# Patient Record
Sex: Female | Born: 1945 | Race: White | Hispanic: No | Smoking: Former smoker
Health system: Southern US, Community
[De-identification: ages and names within clinical notes are randomized; demographics above are authoritative.]

## PROBLEM LIST (undated history)

## (undated) DIAGNOSIS — R079 Chest pain, unspecified: Secondary | ICD-10-CM

## (undated) DIAGNOSIS — G2581 Restless legs syndrome: Secondary | ICD-10-CM

## (undated) DIAGNOSIS — Z5189 Encounter for other specified aftercare: Secondary | ICD-10-CM

## (undated) DIAGNOSIS — Q211 Atrial septal defect, unspecified: Secondary | ICD-10-CM

## (undated) DIAGNOSIS — R55 Syncope and collapse: Secondary | ICD-10-CM

## (undated) DIAGNOSIS — F32A Depression, unspecified: Secondary | ICD-10-CM

## (undated) DIAGNOSIS — M1990 Unspecified osteoarthritis, unspecified site: Secondary | ICD-10-CM

## (undated) DIAGNOSIS — S83207A Unspecified tear of unspecified meniscus, current injury, left knee, initial encounter: Secondary | ICD-10-CM

## (undated) DIAGNOSIS — G629 Polyneuropathy, unspecified: Secondary | ICD-10-CM

## (undated) DIAGNOSIS — R739 Hyperglycemia, unspecified: Secondary | ICD-10-CM

## (undated) DIAGNOSIS — F329 Major depressive disorder, single episode, unspecified: Secondary | ICD-10-CM

## (undated) DIAGNOSIS — K219 Gastro-esophageal reflux disease without esophagitis: Secondary | ICD-10-CM

## (undated) DIAGNOSIS — I5032 Chronic diastolic (congestive) heart failure: Secondary | ICD-10-CM

## (undated) DIAGNOSIS — I10 Essential (primary) hypertension: Secondary | ICD-10-CM

## (undated) DIAGNOSIS — H3530 Unspecified macular degeneration: Secondary | ICD-10-CM

## (undated) HISTORY — DX: Atrial septal defect, unspecified: Q21.10

## (undated) HISTORY — PX: MULTIPLE TOOTH EXTRACTIONS: SHX2053

## (undated) HISTORY — PX: CATARACT EXTRACTION: SUR2

## (undated) HISTORY — DX: Polyneuropathy, unspecified: G62.9

## (undated) HISTORY — DX: Major depressive disorder, single episode, unspecified: F32.9

## (undated) HISTORY — DX: Gastro-esophageal reflux disease without esophagitis: K21.9

## (undated) HISTORY — DX: Unspecified macular degeneration: H35.30

## (undated) HISTORY — PX: TONSILLECTOMY: SUR1361

## (undated) HISTORY — DX: Essential (primary) hypertension: I10

## (undated) HISTORY — DX: Depression, unspecified: F32.A

## (undated) HISTORY — DX: Chest pain, unspecified: R07.9

## (undated) HISTORY — DX: Restless legs syndrome: G25.81

## (undated) HISTORY — DX: Syncope and collapse: R55

## (undated) HISTORY — DX: Hyperglycemia, unspecified: R73.9

## (undated) HISTORY — DX: Unspecified osteoarthritis, unspecified site: M19.90

## (undated) HISTORY — DX: Chronic diastolic (congestive) heart failure: I50.32

---

## 1974-08-27 HISTORY — PX: TUBAL LIGATION: SHX77

## 1974-08-27 HISTORY — PX: APPENDECTOMY: SHX54

## 2004-08-27 HISTORY — PX: CARDIAC CATHETERIZATION: SHX172

## 2005-02-21 ENCOUNTER — Inpatient Hospital Stay (HOSPITAL_COMMUNITY): Admission: AD | Admit: 2005-02-21 | Discharge: 2005-02-27 | Payer: Self-pay | Admitting: Orthopedic Surgery

## 2005-02-23 ENCOUNTER — Ambulatory Visit: Payer: Self-pay | Admitting: Cardiology

## 2005-06-27 HISTORY — PX: JOINT REPLACEMENT: SHX530

## 2005-07-11 ENCOUNTER — Inpatient Hospital Stay (HOSPITAL_COMMUNITY): Admission: RE | Admit: 2005-07-11 | Discharge: 2005-07-16 | Payer: Self-pay | Admitting: Orthopedic Surgery

## 2005-07-25 ENCOUNTER — Encounter: Payer: Self-pay | Admitting: Orthopedic Surgery

## 2005-07-27 ENCOUNTER — Encounter: Payer: Self-pay | Admitting: Orthopedic Surgery

## 2005-08-27 ENCOUNTER — Encounter: Payer: Self-pay | Admitting: Orthopedic Surgery

## 2005-09-27 ENCOUNTER — Encounter: Payer: Self-pay | Admitting: Orthopedic Surgery

## 2005-10-25 ENCOUNTER — Encounter: Payer: Self-pay | Admitting: Orthopedic Surgery

## 2006-08-27 HISTORY — PX: OTHER SURGICAL HISTORY: SHX169

## 2009-04-06 ENCOUNTER — Ambulatory Visit (HOSPITAL_COMMUNITY): Admission: RE | Admit: 2009-04-06 | Discharge: 2009-04-06 | Payer: Self-pay | Admitting: Endocrinology

## 2010-04-17 LAB — LIPID PANEL
HDL: 58 mg/dL (ref 35–70)
LDL Cholesterol: 56 mg/dL
Triglycerides: 184 mg/dL — AB (ref 40–160)

## 2010-04-17 LAB — HEMOGLOBIN A1C: Hgb A1c MFr Bld: 6 % (ref 4.0–6.0)

## 2010-09-21 LAB — HEPATIC FUNCTION PANEL: Bilirubin, Total: 0.3 mg/dL

## 2010-11-23 ENCOUNTER — Encounter: Payer: Self-pay | Admitting: Family Medicine

## 2010-12-04 ENCOUNTER — Ambulatory Visit: Payer: Self-pay | Admitting: Family Medicine

## 2010-12-06 ENCOUNTER — Encounter: Payer: Self-pay | Admitting: Family Medicine

## 2010-12-07 ENCOUNTER — Encounter: Payer: Self-pay | Admitting: Family Medicine

## 2010-12-07 ENCOUNTER — Ambulatory Visit (INDEPENDENT_AMBULATORY_CARE_PROVIDER_SITE_OTHER): Payer: Managed Care, Other (non HMO) | Admitting: Family Medicine

## 2010-12-07 VITALS — BP 142/70 | HR 60 | Temp 98.4°F | Ht 63.5 in | Wt 255.4 lb

## 2010-12-07 DIAGNOSIS — G609 Hereditary and idiopathic neuropathy, unspecified: Secondary | ICD-10-CM

## 2010-12-07 DIAGNOSIS — Z1211 Encounter for screening for malignant neoplasm of colon: Secondary | ICD-10-CM

## 2010-12-07 DIAGNOSIS — F32A Depression, unspecified: Secondary | ICD-10-CM

## 2010-12-07 DIAGNOSIS — I10 Essential (primary) hypertension: Secondary | ICD-10-CM | POA: Insufficient documentation

## 2010-12-07 DIAGNOSIS — R739 Hyperglycemia, unspecified: Secondary | ICD-10-CM

## 2010-12-07 DIAGNOSIS — G629 Polyneuropathy, unspecified: Secondary | ICD-10-CM

## 2010-12-07 DIAGNOSIS — K219 Gastro-esophageal reflux disease without esophagitis: Secondary | ICD-10-CM | POA: Insufficient documentation

## 2010-12-07 DIAGNOSIS — G2581 Restless legs syndrome: Secondary | ICD-10-CM | POA: Insufficient documentation

## 2010-12-07 DIAGNOSIS — F329 Major depressive disorder, single episode, unspecified: Secondary | ICD-10-CM

## 2010-12-07 DIAGNOSIS — M1990 Unspecified osteoarthritis, unspecified site: Secondary | ICD-10-CM

## 2010-12-07 MED ORDER — TRIAMTERENE-HCTZ 37.5-25 MG PO TABS: 1.0000 | ORAL_TABLET | Freq: Every day | ORAL | Status: DC

## 2010-12-07 MED ORDER — GABAPENTIN 300 MG PO CAPS: 300.0000 mg | ORAL_CAPSULE | Freq: Every evening | ORAL | Status: DC | PRN

## 2010-12-07 NOTE — Assessment & Plan Note (Signed)
Deteriorated. Awaiting records so I can review labs.   Start low dose Gabapentin- 300 mg qhs. Pt to call me with an update of her symptoms in 2-3 weeks.

## 2010-12-07 NOTE — Patient Instructions (Signed)
Great to meet you. Please make an appointment to come see me in July (medicare physical).    What is this medicine? GABAPENTIN (GA ba pen tin) is used to control partial seizures in adults with epilepsy. It is also used to treat certain types of nerve pain. This medicine may be used for other purposes; ask your health care provider or pharmacist if you have questions.   What should I tell my health care provider before I take this medicine? They need to know if you have any of these conditions: -kidney disease -suicidal thoughts, plans, or attempt; a previous suicide attempt by you or a family member -an unusual or allergic reaction to gabapentin, other medicines, foods, dyes, or preservatives -pregnant or trying to get pregnant -breast-feeding   How should I use this medicine? Take this medicine by mouth. Swallow it with a drink of water. Follow the directions on the prescription label. If this medicine upsets your stomach, take it with food or milk. Take your medicine at regular intervals. Do not take it more often than directed. If you are directed to break the 600 or 800 mg tablets in half as part of your dose, the extra half tablet should be used for the next dose. If you have not used the extra half tablet within 3 days, it should be thrown away. A special MedGuide will be given to you by the pharmacist with each prescription and refill. Be sure to read this information carefully each time. Talk to your pediatrician regarding the use of this medicine in children. Special care may be needed. Overdosage: If you think you have taken too much of this medicine contact a poison control center or emergency room at once. NOTE: This medicine is only for you. Do not share this medicine with others.   What if I miss a dose? If you miss a dose, take it as soon as you can. If it is almost time for your next dose, take only that dose. Do not take double or extra doses.   What may interact with  this medicine? -antacids -hydrocodone -morphine -naproxen -sevelamer   This list may not describe all possible interactions. Give your health care provider a list of all the medicines, herbs, non-prescription drugs, or dietary supplements you use. Also tell them if you smoke, drink alcohol, or use illegal drugs. Some items may interact with your medicine.   What should I watch for while using this medicine? Visit your doctor or health care professional for regular checks on your progress. You may want to keep a record at home of how you feel your condition is responding to treatment. You may want to share this information with your doctor or health care professional at each visit. You should contact your doctor or health care professional if your seizures get worse or if you have any new types of seizures. Do not stop taking this medicine or any of your seizure medicines unless instructed by your doctor or health care professional. Stopping your medicine suddenly can increase your seizures or their severity. Wear a medical identification bracelet or chain if you are taking this medicine for seizures, and carry a card that lists all your medications. You may get drowsy, dizzy, or have blurred vision. Do not drive, use machinery, or do anything that needs mental alertness until you know how this medicine affects you. To reduce dizzy or fainting spells, do not sit or stand up quickly, especially if you are an older patient. Alcohol  can increase drowsiness and dizziness. Avoid alcoholic drinks. Your mouth may get dry. Chewing sugarless gum or sucking hard candy, and drinking plenty of water will help. The use of this medicine may increase the chance of suicidal thoughts or actions. Pay special attention to how you are responding while on this medicine. Any worsening of mood, or thoughts of suicide or dying should be reported to your health care professional right away. Women who become pregnant while  using this medicine may enroll in the [place] Antiepileptic Drug Pregnancy Registry by calling [phone]. This registry collects information about the safety of antiepileptic drug use during pregnancy.   What side effects may I notice from receiving this medicine? Side effects that you should report to your doctor or health care professional as soon as possible: -allergic reactions like skin rash, itching or hives, swelling of the face, lips, or tongue -worsening of mood, thoughts or actions of suicide or dying   Side effects that usually do not require medical attention (report to your doctor or health care professional if they continue or are bothersome): -constipation -difficulty walking or controlling muscle movements -nausea -slurred speech -tremors -weight gain   This list may not describe all possible side effects. Call your doctor for medical advice about side effects. You may report side effects to FDA at 1-800-FDA-1088.   Where should I keep my medicine? Keep out of reach of children.   Store at room temperature between 15 and 30 degrees C (59 and 86 degrees F). Throw away any unused medicine after the expiration date.   NOTE:This sheet is a summary. It may not cover all possible information. If you have questions about this medicine, talk to your doctor, pharmacist, or health care provider.      2011, Elsevier/Gold Standard.

## 2010-12-07 NOTE — Assessment & Plan Note (Signed)
Continue current meds. Slightly elevated today but pt endorses white coat HTN.

## 2010-12-07 NOTE — Assessment & Plan Note (Signed)
Deteriorated. Discussed importance of physical activity. She admits to becoming more sedentary lately.

## 2010-12-07 NOTE — Progress Notes (Signed)
65 yo here to establish care.  1.  Peripheral neuropathy- borderline diabetic but she has had neuropathy in her feet and toes for years.  Per pt, due to her multiple other co morbidities. Feet have been really tingling lately.  Had recent lab work (awaiting labs).  Never been on any medication for neuropathy, like gabapentin or lyrica.  2.  HTN- takes Maxzide 1 tab daily, Toprol 25 mg daily, and Ramipril 5 mg daily. Denies any blurred vision, CP, or SOB. Has some LE edema, no worse than usual.  3.  Anxiety- admits to being an anxious person. Was on Lexapro and Xanax for years but weaned herself off because she felt it blunted her emotions too much. Overall, feels like she is doing well.  Denies any current symptoms of depression.  ROS: See HPI General: Denies fever, chills, sweats. No significant weight loss. Eyes: Denies blurring,significant itching ENT: Denies earache, sore throat, and hoarseness.  Cardiovascular: Denies chest pains, palpitations, dyspnea on exertion,  Respiratory: Denies cough, dyspnea at rest,wheeezing Breast: no concerns about lumps GI: Denies nausea, vomiting, diarrhea, constipation, change in bowel habits, abdominal pain, melena, hematochezia GU: Denies dysuria, hematuria, urinary hesitancy, nocturia, denies STD risk, no concerns about discharge Musculoskeletal: Endorses joint and back pain. Derm: Denies rash, itching Neuro: Denies  paresthesias, frequent falls, frequent headaches Psych: Denies depression, anxiety Endocrine: Denies cold intolerance, heat intolerance, polydipsia Heme: Denies enlarged lymph nodes  The PMH, PSH, Social History, Family History, Medications, and allergies have been reviewed in [facility], and have been updated if relevant.  Physical exam: BP 142/70  Pulse 60  Temp(Src) 98.4 F (36.9 C) (Oral)  Ht 5' 3.5" (1.613 m)  Wt 255 lb 6.4 oz (115.849 kg)  BMI 44.53 kg/m2  General:  Pleasant overweight female,in no acute distress;  alert,appropriate and cooperative throughout examination Head:  normocephalic and atraumatic.   Eyes:  vision grossly intact, pupils equal, pupils round, and pupils reactive to light.   Ears:  R ear normal and L ear normal.   Nose:  no external deformity.   Mouth:  good dentition.   Neck:  No deformities, masses, or tenderness noted. Lungs:  Normal respiratory effort, chest expands symmetrically. Lungs are clear to auscultation, no crackles or wheezes. Heart:  Normal rate and regular rhythm. S1 and S2 normal without gallop, murmur, click, rub or other extra sounds. Abdomen:  Bowel sounds positive,abdomen soft and non-tender without masses, organomegaly or hernias noted. Msk:  No deformity or scoliosis noted of thoracic or lumbar spine.   Extremities:  No clubbing, cyanosis, edema, or deformity noted with normal full range of motion of all joints.   Neurologic:  alert & oriented X3 and gait normal.   Skin:  Intact without suspicious lesions or rashes Psych:  Cognition and judgment appear intact. Alert and cooperative with normal attention span and concentration. No apparent delusions, illusions, hallucinations

## 2010-12-13 ENCOUNTER — Other Ambulatory Visit: Payer: Managed Care, Other (non HMO)

## 2010-12-13 ENCOUNTER — Encounter: Payer: Self-pay | Admitting: *Deleted

## 2010-12-13 ENCOUNTER — Other Ambulatory Visit (INDEPENDENT_AMBULATORY_CARE_PROVIDER_SITE_OTHER): Payer: Managed Care, Other (non HMO) | Admitting: Family Medicine

## 2010-12-13 DIAGNOSIS — Z1211 Encounter for screening for malignant neoplasm of colon: Secondary | ICD-10-CM

## 2010-12-13 LAB — FECAL OCCULT BLOOD, IMMUNOCHEMICAL: Fecal Occult Bld: NEGATIVE

## 2010-12-22 ENCOUNTER — Encounter: Payer: Self-pay | Admitting: Family Medicine

## 2010-12-25 ENCOUNTER — Encounter: Payer: Self-pay | Admitting: Family Medicine

## 2010-12-28 NOTE — Progress Notes (Signed)
Addended byMills Koller on: 12/28/2010 11:12 AM   Modules accepted: Orders

## 2010-12-29 ENCOUNTER — Other Ambulatory Visit: Payer: Self-pay | Admitting: *Deleted

## 2010-12-29 MED ORDER — TRIAMTERENE-HCTZ 37.5-25 MG PO TABS: 1.0000 | ORAL_TABLET | Freq: Every day | ORAL | Status: DC

## 2011-01-12 NOTE — Discharge Summary (Signed)
NAMEJUANETTE, [name]               ACCOUNT NO.:  [id number]   MEDICAL RECORD NO.:  [id number]          PATIENT TYPE:  INP   LOCATION:  1507                         FACILITY:  [facility]   PHYSICIAN:  Georges Lynch. Gioffre, M.D.DATE OF BIRTH:  1946/03/16   DATE OF ADMISSION:  07/11/2005  DATE OF DISCHARGE:  07/16/2005                                 DISCHARGE SUMMARY   ADMISSION DIAGNOSES:  1.  End-stage osteoarthritis right knee.  2.  Recent diagnosis of asthma.  3.  History of angina with normal recent cardiac stress test and      catheterization.  4.  Hypertension.  5.  Reflux disease.  6.  History of anxiety, depression.  7.  History of migraines.  8.  Frequent diarrhea with blood in her stools currently being monitored and      evaluated by Dr. Dagoberto Ligas.   DISCHARGE DIAGNOSES:  1.  Right total knee arthroplasty.  2.  Acute renal insufficiency, resolved.  3.  Asymptomatic postoperative blood loss anemia.  4.  History of migraines.  5.  History of anxiety/depression.  6.  Recent diagnosis of asthma.  7.  History of angina with normal cardiac stress test and catheterization.  8.  History of hypertension.  9.  History of reflux.  10. History of frequent diarrhea with blood in her stools being monitored by      Dr. Dagoberto Ligas.  11. Obesity.   HISTORY OF PRESENT ILLNESS:  The patient is a 65 year old female with  longstanding history of right knee pains. Multiple evaluations and  treatments have failed. The patient was diagnosed with end-stage  osteoarthritis of the right knee. The patient and Dr. Darrelyn Hillock have elected  to do a right total knee arthroplasty.   ALLERGIES:  No known drug allergies.   CURRENT MEDICATIONS:  1.  Lexapro 10 mg a day.  2.  Triamterene/hydrochlorothiazide 25 a day.  3.  Altace 5 mg a day.  4.  Nexium 40 mg a day.  5.  Toprol-XL 50 mg a day.  6.  Celebrex 200 mg a day.  7.  Albuterol 2 puffs p.r.n.  8.  Requip p.r.n.  9.  Carafate 1 g four times a day  p.r.n.   SURGICAL PROCEDURE:  On July 11, 2005 the patient was taken to the OR by  Ranee Gosselin, M.D. assisted by Arlyn Leak, P.A.C. Under general  anesthesia the patient  underwent a right total knee arthroplasty with a  DePuy rotating bearing system. The patient had the following components  implanted: A size 2 femoral component, size 2 keel tibial tray, size 2 10-mm  polyethylene bearing, size 32 mm 3-peg patella. All components were  implanted with polyethylene methacrylate with Vancomycin mixed in. The  patient tolerated the procedure well and was taken to the recovery room and  then to the orthopedic floor for routine total knee protocol. Coumadin and  Heparin for DVT prophylaxis. IV medication and pain medications.   CONSULTS:  The following routine consults were requested: Physical therapy,  pharmacy for Coumadin management.   HOSPITAL COURSE:  On July 11, 2005 the patient  was admitted to [facility] under the care of Ranee Gosselin, M.D. The patient was taken  to the OR where right total knee arthroplasty was performed without any  complications. The patient tolerated well and was transferred to the  recovery room and then to the orthopedic floor for routine total knee  protocol and IV pain meds, antibiotics and to start Coumadin and Heparin for  DVT prophylaxis. The patient then incurred a total of 5 days postoperative  course in which the patient did develop some mild acute renal insufficiency  with BUN 27 and creatinine of 2.7, but with IV hydration this did improve to  a BUN 15, creatinine of 1.2 over several days. The patient also developed  some mild postoperative blood loss anemia with hemoglobin dropping to 8.9  and hematocrit of 26.4, but the patient's vital signs remained stable. The  patient tolerated it well so no transfusion was required. It was allowed  with supplements to recover on its' own. The patient also developed some  mild calf  discomfort. This was evaluated with a Doppler. This was found to  be negative. Otherwise the patient was able to transition from IV pain  medications and antibiotics to p.o. meds without any problems. Her pain was  well-controlled with some Mepergan Fortis. The patient's wound remained  benign for any signs of infection. Her leg remained neuro, motor and  vascularly intact. The patient worked well with physical therapy. It was  felt that on postop day #5 the patient was orthopedically and medically  stable and ready for transfer to home with outpatient home health and  physical therapy and RN for Pro Time draws. Arrangements were made. The  patient was transferred to home in good condition with a followup in  approximately 10 days with Dr. Darrelyn Hillock in his office.   LABORATORIES:  A CBC on July 16, 2005: WBC 12.9, hemoglobin 10.1,  hematocrit 29.8, platelets 370. Coumadin on July 16, 2005 had an INR  2.3. Routine chemistries on July 15, 2005: Sodium 133, potassium 3.2,  glucose 115 down from a high of 148 on the first postop day, BUN 15,  creatinine 1.2. The patient prior to admission had on her urinalysis large  leukocyte esterase and many bacteria. She was pretreated with Cipro prior to  her surgery. Urine culture showed greater than 100,000 colonies of E. coli  susceptible to normal antibiotics. Venous Doppler on July 16, 2005 shows  no evidence of DVT, SVT or Baker's cyst.   MEDICATIONS:  Upon discharge from ortho floor:  1.  Colace 100 mg p.o. b.i.d.  2.  Iron 325 mg p.o. t.i.d.  3.  Reglan 10 mg p.o. q.8h. p.r.n.  4.  Phenergan 25 mg p.o. q.6h. p.r.n.  5.  Robaxin 500 mg p.o. q.6h. p.r.n.  6.  Protonix 80 mg p.o. daily.  7.  Altace 5 mg daily.  8.  Toprol-XL 50 mg daily.  9.  Carafate 1 g p.o. four times a day.  10. Albuterol inhaler 2 puffs q.6h. p.r.n.  11. Laxative or enema of choice p.r.n. 12. Mepergan Fortis 1-2 tablets every 4-6 hours p.r.n. pain.    DISCHARGE INSTRUCTIONS:  1.  Diet:  No restrictions.  2.  Activity:  No restrictions. May walk with assistance of use of a walker.  3.  Wound care:  The patient is to keep the wound clean and dry, change      dressing daily.   MEDICATIONS:  The patient is to  resume routine meds with the addition of the  following:  1.  Mepergan Fortis 1-2 tablets every 4-6 hours for pain if needed.  2.  Robaxin 500 mg one tablet every 6-8 hours for muscle spasms if needed.  3.  Coumadin 5 mg a day for 30 days, and was changed by the pharmacist.   FOLLOW UP:  The patient should call Dr. Jeannetta Ellis office [phone] to arrange  for an appointment with him for followup in 10 days from discharge.   SPECIAL INSTRUCTIONS:  Home health physical therapy through [place] with  home health RN for Pro Time draws.   CONDITION UPON DISCHARGE TO HOME:  Listed as improved and good.      Jamelle Rushing, P.A.    ______________________________  Georges Lynch Darrelyn Hillock, M.D.    RWK/MEDQ  D:  07/25/2005  T:  07/26/2005  Job:  [id number]

## 2011-01-12 NOTE — Op Note (Signed)
[name], [name] [name]               ACCOUNT NO.:  [id number]   MEDICAL RECORD NO.:  [id number]          PATIENT TYPE:  INP   LOCATION:  0012                         [facility]AC[facility]   PHYSICIAN:  [name] [name], M.D.DATE OF BIRTH:  [birth year]   DATE OF PROCEDURE:  07/11/2005  DATE OF DISCHARGE:                                 OPERATIVE REPORT   PREOPERATIVE DIAGNOSIS:  Severe degenerative arthritis of the right knee.   POSTOPERATIVE DIAGNOSIS:  Severe degenerative arthritis of the right knee.   SURGEON:  Dr. Darrelyn [name]   ASSISTANT:  [name] Leak, PA-C   OPERATION:  Right total knee arthroplasty utilizing the DePuy rotating  platform system.  All 3 components were cemented, and I utilized the  posterior cruciate sacrificing type femoral component.  Sizes used:  Reamer  was 2.5 right posterior cruciate sacrificing type.  The tibial tray was a  size 2 tray.  The insert was a size 10 mm thickness rotating platform  insert, and the patella was a size 32 3-hole peg.  All 3 components were  cemented, and vancomycin was used in the cement.   PROCEDURE:  Under general anesthesia, a routine orthopedic prep and draping  of the right lower extremity is carried out.  Leg was exsanguinated and  esmarched.  Tourniquet was elevated at 400 mmHg.  Following this, the  reflection incision was made over the anterior aspect of the right knee,  bleeders identified and cauterized.  The self-retaining retractors were  inserted.  Following that, the median parapatellar incision was carried out.  The patella was reflected laterally.  The knee was flexed, and we carried  out anterior and posterior excisions.  We also excised the medial and  lateral menisci.  So we did medial and lateral meniscectomies as well as  excising the anterior and posterior cruciate ligament.  Following that, we  then made our initial drill hole in the intercondylar notch; #1 jig was  inserted.  We removed 11 mm thickness off the  distal femur.  We then  inserted our #2 jig and carried out our anterior, posterior, and chamfering  cuts for a size 2.5 femur.  We then made our notch cut in the usual fashion.  Following that, we then went down and made our tibial cuts.  We removed 4 mm  thickness from the affected medial side.  The tray was measured to be a size  2.  After the appropriate cuts were made, we then made our keel cut.  Following that, we went through our trials.  Prior to doing the trials, we  did go through our extension and flexion gap measurements.  We noted that we  were a little tight in the medial side, so we removed 2 mm thickness off of  the tibial plateau.  We then had an excellent fit at that point.  We then  thoroughly water picked out the knee, cemented all 3 components in  simultaneously.  Note, we did do a resurfacing procedure on the patella for  a 32 mm patella.  Once all components were cemented, we removed the  loose  pieces of cement, went through range of motion again.  We had excellent  stability.  I then removed the trial component and then inserted our  permanent 10 mm thickness  size 2 rotating platform components.  We did that after we checked once  again for loose pieces of cement.  We dried the knee out and inserted a  Hemovac drain and then closed the wound in layers in usual fashion.  Sterile  Neosporin dressing was applied over a  Hemovac drain.           ______________________________  [name] [name]. [name] [name], M.D.     RAG/MEDQ  D:  07/11/2005  T:  07/11/2005  Job:  [id number]

## 2011-01-12 NOTE — Cardiovascular Report (Signed)
NAMEJANEEN, WATSON               ACCOUNT NO.:  [id number]   MEDICAL RECORD NO.:  [id number]          PATIENT TYPE:  INP   LOCATION:  3741                         FACILITY:  [facility]   PHYSICIAN:  Salvadore Farber, M.D. LHCDATE OF BIRTH:  08-May-1946   DATE OF PROCEDURE:  02/26/2005  DATE OF DISCHARGE:                              CARDIAC CATHETERIZATION   PROCEDURE:  Left heart catheterization, left ventriculography, coronary  angiography, abdominal aortography.   INDICATIONS FOR PROCEDURE:  Ms. Leske is a 65 year old lady without prior  history of cardiovascular disease status post arthroscopic surgery on her  right knee.  Beginning in the immediate postoperative period, she has  complained of severe chest discomfort lasting from 10 minutes to greater  than an hour.  Cardiac enzymes and electrocardiogram remained within normal  limits.  Chest CT has demonstrated no evidence of pulmonary embolism.  She  is referred for diagnostic angiography.  Due to family history of abdominal  aortic aneurysm, the patient specifically requests abdominal aortography.   PROCEDURE TECHNIQUE:  Informed consent was obtained.  Under 1% lidocaine  local anesthesia, a 5 French sheath was placed in the right common femoral  artery using the modified Seldinger technique.  Diagnostic angiography and  ventriculography was performed using JL4, JR4, and pigtail catheters.  The  patient tolerated the procedure well and was transferred to the holding room  in stable condition.  The sheaths will be removed there.   COMPLICATIONS:  None.   FINDINGS:  1.  Left ventricle:  138/5/13.  EF 65% without regional wall motion      abnormality.  2.  No aortic stenosis or mitral regurgitation.  3.  Left main angiographically normal.  4.  LAD:  Moderate size vessel giving rise to no significant diagonals.  It      is angiographically normal.  5.  Ramus intermedius:  Moderate size vessel.  It is angiographically  normal.  6.  Circumflex:  Moderate size vessel giving rise to a single obtuse      marginal.  It is angiographically normal.  7.  RCA:  Moderate size dominant vessel.  It is angiographically normal.  8.  Abdominal aorta:  No evidence of aneurysm or atherosclerotic disease.      Vessel was normal.  9.  Renal arteries:  Single vessel on the left and two vessels on the right.      The two vessels on the right are roughly equal in size.  All three renal      arteries are angiographically normal.   IMPRESSION/RECOMMENDATION:  1.  Angiographically normal coronary arteries.  2.  Normal left ventricular size and systolic function.  3.  Normal left ventricular end diastolic pressure.  4.  No evidence of abdominal aortic aneurysm.   There is no cardiac explanation for the patient's chest discomfort.  Further  evaluation per Dr. Dagoberto Ligas.       WED/MEDQ  D:  02/26/2005  T:  02/26/2005  Job:  [id number]   cc:   Alfonse Alpers. Dagoberto Ligas, M.D.  [address]  [place]  [place] [address]  Fax: [phone]   Georges Lynch. Darrelyn Hillock, M.D.  [facility]  [address]  [address]  [place]  [place] [address]  Fax: [phone]

## 2011-01-12 NOTE — Consult Note (Signed)
NAMEVALLEY, KE               ACCOUNT NO.:  [id number]   MEDICAL RECORD NO.:  [id number]          PATIENT TYPE:  INP   LOCATION:  3731                         FACILITY:  [facility]   PHYSICIAN:  Georges Lynch. Gioffre, M.D.DATE OF BIRTH:  1945/10/20   DATE OF CONSULTATION:  DATE OF DISCHARGE:                                   CONSULTATION   I did an arthroscopic medial meniscectomy here this afternoon at [facility] on her right knee and while in the recovery room here,  she developed pressure pain in her chest on several occasions.  We did do an  EKG on her here and it was normal.  We also gave her aspirin.  She had a  history of this in the past.  She is a patient of Dr. Corrin Parker, myself  and the anesthesiologist felt that it would be safe to go ahead and just  admit her to a monitored bed at [facility] so we could observe her  and then will let Dr. Dagoberto Ligas make a decision on her later in the morning or  tonight.   PAST MEDICAL HISTORY:  Basically, in the past history, she has a history of  hypertension and asthma.   PAST SURGICAL HISTORY:  She had a right knee arthroscopic medial  meniscectomy by me today.   MEDICATIONS:  She is on Toprol 50 mg daily, Altace 5 mg daily,  Triamterene/HCTZ 25 mg 1/2 tablet daily, Zoloft, Nexium 40 mg daily,  Albuterol inhaler p.r.n.   PHYSICAL EXAMINATION:  VITAL SIGNS:  Blood pressure 140/80, temperature 98, pulse 60 and regular.  GENERAL:  She was alert and oriented.  HEAD AND NECK:  Negative.  LUNGS:  Clear.  HEART:  Normal sinus rhythm, no murmur.  ABDOMEN:  Negative.  EXTREMITIES:  Upper extremities normal.  Hips were normal.  She just had a  right knee arthroscopic exam.  Calves were fine, no signs of any phlebitis.  Circulation in her lower extremities were intact.   X-RAYS:  None were necessary here today.   She had an EKG and the anesthesiologist here at [facility] read that as  normal.    IMPRESSION:  1.  Rule out angina.  2.  Postop right knee medial meniscectomy.  3.  Hypertension.  4.  Asthma.   PLAN:  I called and got her a direct admit at [facility] on a  monitored bed.  I  did call Dr. Reather Littler, who will follow her this evening.  We will get a  cardiac panel, chest x-ray, and an EKG while she is at [facility].       RAG/MEDQ  D:  02/21/2005  T:  02/21/2005  Job:  [id number]   cc:   Alfonse Alpers. Dagoberto Ligas, M.D.  [address], [address]  [place]  [place] [address]  Fax: [phone]

## 2011-01-12 NOTE — Discharge Summary (Signed)
[name], [name]               ACCOUNT NO.:  [id number]   MEDICAL RECORD NO.:  [id number]          PATIENT TYPE:  INP   LOCATION:  3741                         FACILITY:  [facility]   PHYSICIAN:  Georges Lynch. Gioffre, M.D.DATE OF BIRTH:  May 05, 1946   DATE OF ADMISSION:  02/21/2005  DATE OF DISCHARGE:  02/27/2005                                 DISCHARGE SUMMARY   HOSPITAL COURSE:  The patient had an arthroscopic procedure done at the  outpatient surgery center by me on February 21, 2005 and she complained of a  pressure-type pain in her chest and after a long observation, I called Dr.  Corrin Parker and apparently Dr. Reather Littler was covering for Dr. Dagoberto Ligas and  we had her admitted to the hospital to his service, but I am being asked to  dictate this discharge summary.  So basically, she was transferred from the  Surgery Service to Dr. Jerelene Redden service with that complaint.  She was seen  in consultation by Dr. Reather Littler and basically there was a negative cardiac  evaluation on her.  She apparently said she had some relief from the  nitroglycerin.  Dr. Lucianne Muss thought that perhaps that this could be due to  esophageal spasms.  She was started on Protonix and a chest x-ray was  ordered and a CAT scan to rule out a pulmonary embolism.  She was also seen  by Dr. Corrin Parker and Dr. Dagoberto Ligas evaluated her.   LABORATORY AND ACCESSORY CLINICAL DATA:  Her white blood cell count was  11.5, hemoglobin 12.9, hematocrit 37.5, platelets 266,000; sodium 139,  potassium 3.1, BUN 10, creatinine 1.1, glucose 137.  Cardiac enzymes were  negative x3.   An adenosine Cardiolite study was done on her and she was also started on  Lovenox.  She apparently was followed by those particularly physicians and  later released after a catheterization was done.  The report said normal  coronary arteries at that time.  There was no cardiac explanation for her  chest pain and that is basically where it was left.  As I stated  earlier, I  just transferred the patient to the cardiac service.  I am not sure why I  was asked to do this discharge summary, but I am going to do the best I can  from an orthopedic standpoint.   Her chest x-ray she had done showed no acute problem.   The CAT scan of her chest showed no pulmonary embolism.  She had mild left  lower lobe atelectasis and repeat chest x-ray showed no active chest  disease.   She had a myocardial imaging done as well that showed no inducible ischemia  and there was normal wall motion and an ejection fraction of 83%.   FINAL DIAGNOSIS:  From what I can gather from the orthopedic standpoint, the  diagnosis is chest pain, etiology deferred.           ______________________________  Georges Lynch. Darrelyn Hillock, M.D.     RAG/MEDQ  D:  04/26/2005  T:  04/27/2005  Job:  [id number]

## 2011-01-12 NOTE — Consult Note (Signed)
[name], [name]               ACCOUNT NO.:  [id number]   MEDICAL RECORD NO.:  [id number]          PATIENT TYPE:  INP   LOCATION:  3731                         FACILITY:  [facility]   PHYSICIAN:  Reather Littler, M.D.       DATE OF BIRTH:  12-Jul-1946   DATE OF CONSULTATION:  02/21/2005  DATE OF DISCHARGE:                                   CONSULTATION   REFERRING PHYSICIAN:  Dr. Georges Lynch. Gioffre.   CHIEF COMPLAINT:  Chest pain.   HISTORY:  This patient is a 65 year old hypertensive lady who had  arthroscopy on her left knee today.  In the recovery room, the patient  complained of substernal heaviness along with a feeling of shortness of  breath.  Apparently, the patient was given some albuterol and oxygen.  She  apparently did have slight improvement in her pain, but it persisted and it  moved to the left side.  The patient says the pain was a #6 on a scale of 1-  10 and was also radiating through to her back in the same area.  She was  tried on nitroglycerin when she arrived at the hospital and this apparently  relieved her pain temporarily, but subsequently became more intermittent and  recurrent.  She has been on oxygen and does not feel short of breath, but  feels the need to take a deep breath when she has a pain.  She has no  radiation to her neck, but it seems to radiate slightly towards the upper-  inner part of her arm slightly.  She does not feel any nausea or heartburn  currently.   The patient says that previously she has had symptoms of reflux, mostly in  the form of substernal heaviness and also nausea.  She has not had left  chest pain before; however, the patient says that the last few weeks she  would tend to get short of breath and tired on exertion and is not sure if  she had any chest tightness with the exertion.   The patient did not have any other untoward problem during anesthesia.  She  was given labetalol for her blood pressure being high and also given an  aspirin prior to transfer; the labetalol was given at about 5 p.m.   PAST MEDICAL HISTORY:  She has not had any significant illnesses.   ALLERGIES:  None.   CURRENT MEDICATIONS:  Toprol, Altace, Maxzide, Zoloft, Nexium, albuterol and  occasional Aleve.   PERSONAL HISTORY:  She smokes 25 years ago.   FAMILY HISTORY:  Family history positive for heart disease, cancer and  stomach problems, and diabetes.   REVIEW OF SYSTEMS:  The patient has had hypertension for several years.  She  has had right knee osteoarthritis.  She had some bronchitis in the last 2  months and was told to have some asthma.  She has had reflux as above.  She  has a history of tubal ligation.   There is no history of diabetes and no history of other GI problems or  edema, or claudication.   PHYSICAL  EXAMINATION:  VITAL SIGNS:  Blood pressure is 129/74.  Pulse was  76.  GENERAL:  She is alert and cooperative.  She is moderate distress from the  pain.  EYES:  Externally normal.  NECK:  Exam normal.  No carotid bruit.  HEART:  Heart sounds are normal.  CHEST:  Lungs clear.  She has mild left upper chest wall tenderness.  ABDOMEN:  Normal to exam.  EXTREMITIES:  The right side is examined, but shows no edema and normal  pedal pulses.   ASSESSMENT:  The patient has somewhat atypical chest pain.  Currently,  cardiac etiology is not evident because of a negative EKG and enzymes, but  she did get some relief with nitroglycerin.  Other considerations are  esophageal spasm and pulmonary embolism.   PLAN:  Plan would be to empirically give her Protonix now and also get chest  x-ray and CT scan to rule out PE.  She may need cardiac consultation and  evaluation also.       AK/MEDQ  D:  02/21/2005  T:  02/22/2005  Job:  [id number]   cc:   Windy Fast A. Darrelyn Hillock, M.D.  [facility]  [address]  [address]  [place]  [place] [address]  Fax: [phone]   Alfonse Alpers. Dagoberto Ligas, M.D.  [address], [address]   [place]  [place] [address]  Fax: [phone]

## 2011-01-12 NOTE — H&P (Signed)
[name], [name]               ACCOUNT NO.:  [id number]   MEDICAL RECORD NO.:  [id number]          PATIENT TYPE:  INP   LOCATION:  NA                           FACILITY:  [facility]   PHYSICIAN:  Georges Lynch. Gioffre, M.D.DATE OF BIRTH:  [birth year]   DATE OF ADMISSION:  07/11/2005  DATE OF DISCHARGE:                                HISTORY & PHYSICAL   PREOPERATIVE HISTORY AND PHYSICAL   CHIEF COMPLAINT:  Right knee pain.   HISTORY OF PRESENT ILLNESS:  The patient is 65 year old female with a long-  standing history of right knee pain. She has pain with ambulation and range  of motion and it is mostly in the medial aspect. She does have swelling in  the knee, grinding in the knee; it is very difficult to assist for  ambulation and she is tired of the discomfort and disability and would like  a total knee replacement.   X-rays reveal she has significant arthritic changes in her right knee with  significant loss of medial joint spacing.   ALLERGIES:  NO KNOWN DRUG ALLERGIES.   CURRENT MEDICATIONS:  1.  Lexapro 10 mg a day.  2.  Triamterene/hydrochlorothiazide 25 mg a day.  3.  Altace 5 mg a day.  4.  Nexium 40 mg a day.  5.  Toprol-XL 50 mg a day.  6.  Celebrex 200 mg a day.  7.  Albuterol 2 puffs as needed.  8.  Requip p.r.n.  9.  Carafate 1 g q.i.d. p.r.n.   PRESENT MEDICAL HISTORY:  1.  Headaches.  2.  Anxiety and depression.  3.  History of recent diagnosis of asthma.  4.  Angina with a recent normal cardiac stress test and catheterization.  5.  Hypertension.  6.  Reflux disease.  7.  Anemia as a child.  8.  Frequent diarrhea with blood in her stool.   PAST SURGICAL HISTORY:  1.  A tubal ligation in 1976.  2.  Tonsils out in 1950.  3.  Arthroscopic surgery in 2006. The patient's only complication from the      surgical procedure was significant chest pain and shortness of breath      with her arthroscopic surgery. She was transferred from the outpatient  facility to [facility] for evaluation where she subsequently underwent a      stress test and catheterization which were reported to be normal.   PRIMARY CARE PHYSICIAN:  Alfonse Alpers. Dagoberto Ligas, M.D.   FAMILY MEDICAL HISTORY:  Father has a history of aortic aneurysms, heart  disease, hypertension, diabetes, and arthritis. Mother has a history of  hypertension, history of fluid on the brain. Several aunts with a history of  diabetes and cancer.   SOCIAL HISTORY:  The patient is married. She is currently a travel agent.  She is married, lives in a house 1 story, 3 steps to the front entrance. She  stopped smoking about 25 years previous. She does not drink alcohol.   PHYSICAL EXAMINATION:  VITALS:  Height is 5 feet 3 inches, weight is 240,  temperature is 98.2, blood pressure is  138/88, pulse of 84 and regular.  GENERAL:  This is a short in stature, obese white female, appears to be in  no obvious distress. Does walk with a significant right-sided limp, easily  gets on and off the exam table.  HEENT:  Head was normocephalic. Pupils equal, round and reactive.  Extraocular eye movements intact. External layers were without deformities.  Gross hearing is intact. Oral buccal mucosa was pink and moist.  NECK:  Supple, no palpable lymphadenopathy, thyroid region was nontender,  she had good range of motion of her cervical spine.  CHEST:  Lung wounds were clear and equal bilaterally; no wheezes, rales,  rhonchi.  HEART:  Regular rate and rhythm; no murmurs, rubs or gallops.  ABDOMEN:  Round, soft, bowel sounds normoactive. No CVA region tenderness.  EXTREMITIES:  Upper extremities are symmetrical in size and shape. She had  good range of motion of her shoulders, elbows, wrists. Motor strength was  5/5. Lower extremities: Right and left hip had full extension, flexion up to  120 degrees with 20 degrees internal and external rotation. Right knee was  round, full appearing. She had full extension. She  was able to flex it back  to 90 degrees, limited by discomfort and soft tissue. She was tender along  the medial joint line. She did have some valgus-varus laxity. She did have  some crepitus on the patella. She has a soft, nontender calf. The left knee  had full extension. She had flexion back to about 100 degrees, no  instability, nontender, no effusion, no erythema. Calf was soft and  nontender. The ankles were symmetrical with good dorsi and plantar flexion.  NEUROLOGIC:  The patient was conscious, alert and appropriate, ease of  conversation with examiner. Cranial nerves II-XII were grossly intact. She  had no gross neurologic defects noted.  PERIPHERAL VASCULAR:  Carotid pulses are 2+, no bruits. Radial pulses were  2+. Unable to palpate dorsalis pedis pulses, but she had brisk capillary  refill. No lower extremity edema or venous stasis changes.   IMPRESSION:  1.  Severe osteoarthritis, right knee.  2.  Recent history of angina with a stress test and catheterization workup      which was normal.  3.  Recent diagnosis of asthma.  4.  Frequent diarrhea with bloody stools, being monitored by Dr. Dagoberto Ligas.  5.  History of headaches.  6.  History of anxiety and depression.  7.  History of recent memory loss issues related to her recent surgery or      anesthesia.  8.  History of reflux disease.  9.  Obesity.   PLAN:  The patient has been evaluated by Dr. Dagoberto Ligas and has been cleared for  this upcoming surgical procedure on her right knee. She will undergo all of  the routine labs and tests prior to having a right total knee arthroplasty  by Dr. Darrelyn Hillock on November 15.      Jamelle Rushing, P.A.    ______________________________  Georges Lynch Darrelyn Hillock, M.D.    RWK/MEDQ  D:  07/06/2005  T:  07/06/2005  Job:  [id number]

## 2011-01-12 NOTE — Consult Note (Signed)
NAMEZORAYA, FIORENZA               ACCOUNT NO.:  [id number]   MEDICAL RECORD NO.:  [id number]          PATIENT TYPE:  INP   LOCATION:  3731                         FACILITY:  [facility]   PHYSICIAN:  Arvilla Meres, M.D. LHCDATE OF BIRTH:  1946-03-09   DATE OF CONSULTATION:  02/22/2005  DATE OF DISCHARGE:                                   CONSULTATION   PRIMARY CARE PHYSICIAN:  Alfonse Alpers. Dagoberto Ligas, M.D.   REASON FOR CONSULTATION:  Chest pain.   This is a 65 year old Caucasian female status post left knee arthroscopy  surgery on February 21, 2005.  Patient again having discomfort in recovery.  She  complained of shortness of breath with sternal heaviness.  Apparently an EKG  was done and per documentation, EKG was normal.  Patient was admitted to  telemetry for further evaluation of chest discomfort.  Ms. Nies states  that she was given nitroglycerin with just mild relief of chest discomfort  and chest discomfort returned within one to two minutes.  Ms. Cedar is  somewhat vague in description of her discomfort.  She states the discomfort  starts in the diaphragmatic area and radiates midsternally and then around  her left breast and down under her left arm and to her back between her  shoulder blades.  She states that sometimes it is uncomfortable and tender  to palpation when she has the discomfort.  Ms. Molyneux has had a decrease in  her activity level secondary to knee discomfort.  She also complains of  dyspnea on exertion that has increased with minimal activity over the last  few months.  She states the most exertional thing she does is walking.  She  states that she walks about 50 feet from her car to  her place of business  and becomes short of breath with that activity.  She has also experienced  some similar chest tightness with activity at home similar to the episodes  that occurred this admission.  She rates the pressure around 5 to 7 when it  occurred in recovery room.  She  currently is pain-free.   ALLERGIES:  No known drug allergies.   MEDICATIONS:  1.  Aspirin 325 mg.  2.  Toprol XL 50 mg.  3.  Altace 5 mg.  4.  Zoloft 50 mg.  5.  Protonix 40 mg.  6.  Triamterene 37.5/25 mg daily.  7.  Albuterol inhaler p.r.n.  8.  Lovenox 60 mg subcu daily.   PAST MEDICAL HISTORY:  1.  Positive for hypertension.  2.  Asthma.  3.  Osteoarthritis.  4.  GERD.  5.  Status post tubal ligation.  6.  No history of cardiac work-up.   SOCIAL HISTORY:  [place]. [place]  husband.  She works for a [employer], [employer].  She quit smoking tobacco 25 years  ago.  Exercise has been somewhat limited secondary to knee discomfort.  She  tries to follow a low fat diet.  Denies any drug, herbal medication or  alcohol use.   FAMILY HISTORY:  Her mother is live with  hypertension.  Father deceased at  age 44 with history of diabetes, coronary artery disease, status post CABG,  evidently died from a ruptured bowel.  Siblings:  She has a brother who is  diagnosed with depression.   REVIEW OF SYMPTOMS:  Positive for diaphoresis at night.  CARDIOPULMONARY:  Positive for chest pain, dyspnea on exertion.  Patient states she has  chronic edema in her lower extremities, positive palpitations at times.  Positive for coughing and wheezing, relieved with her albuterol inhaler.  MS:  Positive for joint swelling in right knee and pain in right knee,  positive surgery.  GI:  Positive for diarrhea, positive GERD symptoms,  positive for some melena with diarrhea at times.   PHYSICAL EXAMINATION:  GENERAL APPEARANCE:  She is in no acute distress.  VITAL SIGNS:  Temperature 98.2, pulse 78, respirations 20, blood pressure  114/74, weight 240 pounds, sat 99% on four liters.  HEENT:  Pupils are equal, round and reactive to light.  Sclera is clear.  Mucous membranes pink and moist.  Dentition:  She has her own teeth.  NECK:  Supple without lymphadenopathy.  Negative  bruit, negative JVD.  LUNGS:  Clear to auscultation bilaterally.  CARDIOVASCULAR:  Heart rate with regular rhythm, S1 and S2.  Pulses are 2+  and equal without bruits.  ABDOMEN:  Soft and nontender with positive bowel sounds.  She does have some  palpable tenderness in the epigastric area up into the mid epigastric area.  EXTREMITIES:  Left foot has 2+ DP, trace edema left ankle.  Deferred right  lower extremity secondary to surgical site.  NEUROLOGIC:  She is alert and oriented x3.   Chest x-ray showed no acute disease.  Chest CT negative for pulmonary  embolism.   EKG:  Sinus rhythm, rate of 80.  No STT wave changes.   LABORATORY DATA:  White blood cell count 11.5, hemoglobin 12.9, hematocrit  37.5, platelet count 266,000.  Sodium 139, potassium 3.1, BUN 10, creatinine  1.1, glucose 137.  Cardiac enzymes negative x3.   Dr. Nicholes Mango in to examine and assess patient.  Patient with both  typical and atypical features of chest pain.  Suspicion for significant CAD  is fairly low, though patient has some risk factors.  We will proceed with  an  adenosine Cardiolite in a.m., continue patient on Lovenox, aspirin, beta-  blocker, check a lipid panel, check a hemoglobin A1c with elevated glucose  level, increase her PPI to b.i.d.  Hypokalemia has already been treated per  primary care.       MB/MEDQ  D:  02/22/2005  T:  02/22/2005  Job:  [id number]   cc:   Alfonse Alpers. Dagoberto Ligas, M.D.  [address]  [place]  [place] [address]  Fax: [phone]

## 2011-02-27 ENCOUNTER — Other Ambulatory Visit: Payer: Self-pay | Admitting: Family Medicine

## 2011-02-27 DIAGNOSIS — I10 Essential (primary) hypertension: Secondary | ICD-10-CM

## 2011-02-27 DIAGNOSIS — Z136 Encounter for screening for cardiovascular disorders: Secondary | ICD-10-CM

## 2011-03-01 ENCOUNTER — Other Ambulatory Visit (INDEPENDENT_AMBULATORY_CARE_PROVIDER_SITE_OTHER): Payer: Managed Care, Other (non HMO) | Admitting: Family Medicine

## 2011-03-01 DIAGNOSIS — I10 Essential (primary) hypertension: Secondary | ICD-10-CM

## 2011-03-01 DIAGNOSIS — Z136 Encounter for screening for cardiovascular disorders: Secondary | ICD-10-CM

## 2011-03-01 LAB — BASIC METABOLIC PANEL
BUN: 16 mg/dL (ref 6–23)
CO2: 26 mEq/L (ref 19–32)
Calcium: 9.6 mg/dL (ref 8.4–10.5)
Chloride: 106 mEq/L (ref 96–112)
Creatinine, Ser: 1 mg/dL (ref 0.4–1.2)
GFR: 61.66 mL/min (ref >60.00)
Glucose, Bld: 121 mg/dL — ABNORMAL HIGH (ref 70–99)
Potassium: 3.7 mEq/L (ref 3.5–5.1)
Sodium: 137 mEq/L (ref 135–145)

## 2011-03-01 LAB — LIPID PANEL
Cholesterol: 132 mg/dL (ref 0–200)
HDL: 55.7 mg/dL (ref >39.00)
LDL Cholesterol: 48 mg/dL (ref 0–99)
Total CHOL/HDL Ratio: 2
Triglycerides: 144 mg/dL (ref 0.0–149.0)
VLDL: 28.8 mg/dL (ref 0.0–40.0)

## 2011-03-08 ENCOUNTER — Encounter: Payer: Self-pay | Admitting: Family Medicine

## 2011-03-08 ENCOUNTER — Ambulatory Visit (INDEPENDENT_AMBULATORY_CARE_PROVIDER_SITE_OTHER): Payer: Managed Care, Other (non HMO) | Admitting: Family Medicine

## 2011-03-08 ENCOUNTER — Other Ambulatory Visit (HOSPITAL_COMMUNITY)
Admission: RE | Admit: 2011-03-08 | Discharge: 2011-03-08 | Disposition: A | Discharge status: Home / Self Care | Payer: Medicare Other | Source: Ambulatory Visit | Attending: Family Medicine | Admitting: Family Medicine

## 2011-03-08 VITALS — BP 130/82 | HR 83 | Temp 98.6°F | Ht 63.25 in | Wt 249.8 lb

## 2011-03-08 DIAGNOSIS — Z0000 Encounter for general adult medical examination without abnormal findings: Secondary | ICD-10-CM

## 2011-03-08 DIAGNOSIS — F32A Depression, unspecified: Secondary | ICD-10-CM

## 2011-03-08 DIAGNOSIS — I10 Essential (primary) hypertension: Secondary | ICD-10-CM

## 2011-03-08 DIAGNOSIS — Z124 Encounter for screening for malignant neoplasm of cervix: Secondary | ICD-10-CM

## 2011-03-08 DIAGNOSIS — Z1159 Encounter for screening for other viral diseases: Secondary | ICD-10-CM | POA: Insufficient documentation

## 2011-03-08 DIAGNOSIS — Z1231 Encounter for screening mammogram for malignant neoplasm of breast: Secondary | ICD-10-CM

## 2011-03-08 DIAGNOSIS — R7309 Other abnormal glucose: Secondary | ICD-10-CM

## 2011-03-08 DIAGNOSIS — F329 Major depressive disorder, single episode, unspecified: Secondary | ICD-10-CM

## 2011-03-08 DIAGNOSIS — R739 Hyperglycemia, unspecified: Secondary | ICD-10-CM

## 2011-03-08 MED ORDER — METFORMIN HCL ER 500 MG PO TB24: 500.0000 mg | ORAL_TABLET | Freq: Every day | ORAL | Status: DC

## 2011-03-08 NOTE — Progress Notes (Signed)
65 yo here for annual medicare wellness visit.  I have personally reviewed the Medicare Annual Wellness questionnaire and have noted 1. The patient's medical and social history 2. Their use of alcohol, tobacco or illicit drugs 3. Their current medications and supplements 4. The patient's functional ability including ADL's, fall risks, home safety risks and hearing or visual             impairment. 5. Diet and physical activities 6. Evidence for depression or mood disorders  1.  Peripheral neuropathy- borderline diabetic but she has had neuropathy in her feet and toes for years.  Per pt, due to her multiple other co morbidities. Feet have been really tingling lately.  Never been on any medication for neuropathy, like gabapentin or lyrica.  2.  HTN- takes Maxzide 1 tab daily, Toprol 25 mg daily, and Ramipril 5 mg daily. Denies any blurred vision, CP, or SOB. Has some LE edema, no worse than usual. BP stable today.  3.  Hyperglycemia- CBG elevated at 121. Pt is aware that her diet and weight are an issue.  4.  Depression- stable. Does not want medication, felt like zombie.  At times, she gets a little tearful when she worries about her husband's health problems and finances. She denies any SI or HI and overall feels like she is doing well.  Well woman- IFOB neg in 11/2010, does not want to have a colonoscopy yet. Due for mammogram.  Has not had a pap smear in many years.  ROS: See HPI General: Denies fever, chills, sweats. No significant weight loss. Eyes: Denies blurring,significant itching ENT: Denies earache, sore throat, and hoarseness.  Cardiovascular: Denies chest pains, palpitations, dyspnea on exertion,  Respiratory: Denies cough, dyspnea at rest,wheeezing Breast: no concerns about lumps GI: Denies nausea, vomiting, diarrhea, constipation, change in bowel habits, abdominal pain, melena, hematochezia GU: Denies dysuria, hematuria, urinary hesitancy, nocturia, denies STD  risk, no concerns about discharge Musculoskeletal: Endorses joint and back pain. Derm: Denies rash, itching Neuro: Denies  paresthesias, frequent falls, frequent headaches Psych: Denies depression, anxiety Endocrine: Denies cold intolerance, heat intolerance, polydipsia Heme: Denies enlarged lymph nodes  Patient Active Problem List  Diagnoses  . Depression  . Hypertension  . GERD (gastroesophageal reflux disease)  . Hyperglycemia  . DJD (degenerative joint disease)  . Restless leg  . Peripheral neuropathy  . Routine general medical examination at a health care facility   Past Medical History  Diagnosis Date  . Depression   . Hypertension   . GERD (gastroesophageal reflux disease)   . Hyperglycemia   . DJD (degenerative joint disease)   . Restless leg   . Peripheral neuropathy    Past Surgical History  Procedure Date  . Right knee replacement 2008  . Tubal ligation 1976  . Appendectomy 1976  . Tonsillectomy childhood   History  Substance Use Topics  . Smoking status: Former Games developer  . Smokeless tobacco: Not on file  . Alcohol Use: Not on file   Family History  Problem Relation Age of Onset  . Parkinsonism Mother    No Known Allergies Current Outpatient Prescriptions on File Prior to Visit  Medication Sig Dispense Refill  . gabapentin (NEURONTIN) 300 MG capsule Take 1 capsule (300 mg total) by mouth at bedtime as needed.  90 capsule  2  . metoprolol (TOPROL XL) 50 MG 24 hr tablet Take 50 mg by mouth daily.        Marland Kitchen omeprazole (PRILOSEC) 10 MG capsule Take 10 mg  by mouth daily.        . ramipril (ALTACE) 5 MG capsule Take 5 mg by mouth daily.        Marland Kitchen rOPINIRole (REQUIP) 1 MG tablet Take 1 mg by mouth 3 (three) times daily.        Marland Kitchen triamterene-hydrochlorothiazide (MAXZIDE-25) 37.5-25 MG per tablet Take 1 tablet by mouth daily.  90 tablet  1  . albuterol (PROVENTIL,VENTOLIN) 90 MCG/ACT inhaler Inhale 2 puffs into the lungs every 6 (six) hours as needed.           The PMH, PSH, Social History, Family History, Medications, and allergies have been reviewed in [facility], and have been updated if relevant.  Physical exam: BP 130/82  Pulse 83  Temp(Src) 98.6 F (37 C) (Oral)  Ht 5' 3.25" (1.607 m)  Wt 249 lb 12 oz (113.286 kg)  BMI 43.89 kg/m2  General:  Well-developed,overweight, well-nourished,in no acute distress; alert,appropriate and cooperative throughout examination Head:  normocephalic and atraumatic.   Eyes:  vision grossly intact, pupils equal, pupils round, and pupils reactive to light.   Ears:  R ear normal and L ear normal.   Nose:  no external deformity.   Mouth:  good dentition.   Neck:  No deformities, masses, or tenderness noted. Breasts:  No mass, nodules, thickening, tenderness, bulging, retraction, inflamation, nipple discharge or skin changes noted.   Lungs:  Normal respiratory effort, chest expands symmetrically. Lungs are clear to auscultation, no crackles or wheezes. Heart:  Normal rate and regular rhythm. S1 and S2 normal without gallop, murmur, click, rub or other extra sounds. Abdomen:  Bowel sounds positive,abdomen soft and non-tender without masses, organomegaly or hernias noted. Rectal:  no external abnormalities.   Genitalia:  Pelvic Exam:        External: normal female genitalia without lesions or masses        Vagina: normal without lesions or masses        Cervix: normal without lesions or masses        Adnexa: normal bimanual exam without masses or fullness        Uterus: normal by palpation        Pap smear: performed Msk:  No deformity or scoliosis noted of thoracic or lumbar spine.   Extremities:  No clubbing, cyanosis, edema, or deformity noted with normal full range of motion of all joints.   Neurologic:  alert & oriented X3 and gait normal.   Skin:  Intact without suspicious lesions or rashes, +multiple SK Cervical Nodes:  No lymphadenopathy noted Axillary Nodes:  No palpable lymphadenopathy Psych:   Cognition and judgment appear intact. Alert and cooperative with normal attention span and concentration. No apparent delusions, illusions, hallucinations     Assessment and Plan: 1. Routine general medical examination at a health care facility  The patients weight, height, BMI and visual acuity have been recorded in the chart I have made referrals, counseling and provided education to the patient based review of the above and I have provided the pt with a written personalized care plan for preventive services.  Orders Placed This Encounter  Procedures  . MM Digital Screening   Pap smear performed today.  2. Hypertension  Stable.  3 Hyperglycemia   Deteriorated.  Start Metformin. See pt instructions for details.

## 2011-03-08 NOTE — Patient Instructions (Signed)
Good to see you. Please stop by to see [name] on your way out. Start taking Metformin 500 mg daily.  IMPORTANT: HOW TO USE THIS INFORMATION:  This is a summary and does NOT have all possible information about this product. This information does not assure that this product is safe, effective, or appropriate for you. This information is not individual medical advice and does not substitute for the advice of your health care professional. Always ask your health care professional for complete information about this product and your specific health needs.    METFORMIN - ORAL (met-FOR-min)    COMMON BRAND NAME(S): Glucophage    WARNING:  Metformin can rarely cause a serious (sometimes fatal) condition called lactic acidosis. Stop taking metformin and get medical help right away if you develop any of the following symptoms of lactic acidosis: unusual tiredness, dizziness, severe drowsiness, chills, blue/cold skin, muscle pain, fast/difficult breathing, slow/irregular heartbeat, stomach pain with nausea, vomiting, or diarrhea. Lactic acidosis is more likely to occur in patients who have certain medical conditions, including kidney or liver disease, recent surgery, a serious infection, conditions that may cause a low level of oxygen in the blood or poor circulation (such as congestive heart failure, recent heart attack, recent stroke), heavy alcohol use, a severe loss of body fluids (dehydration), or X-ray or scanning procedures that require an injectable iodinated contrast drug. Tell your doctor immediately if any of these conditions occur or if you notice a big change in your overall health. You may need to stop taking this medication temporarily. The elderly are also at higher risk, especially those older than 80 years who have not had kidney tests. (See also Side Effects and Precautions sections.)    USES:  Metformin is used with a proper diet and exercise program and possibly with other medications to  control high blood sugar. It is used in patients with type 2 diabetes (non-insulin-dependent diabetes). Controlling high blood sugar helps prevent kidney damage, blindness, nerve problems, loss of limbs, and sexual function problems. Proper control of diabetes may also lessen your risk of a heart attack or stroke. Metformin works by helping to restore your body's proper response to the insulin you naturally produce. It also decreases the amount of sugar that your liver makes and that your stomach/intestines absorb.    OTHER USES:  This section contains uses of this drug that are not listed in the approved professional labeling for the drug but that may be prescribed by your health care professional. Use this drug for a condition that is listed in this section only if it has been so prescribed by your health care professional. Metformin may be used with lifestyle changes such as diet and exercise to prevent diabetes in people who are at high risk for becoming diabetic. It is also used in women with a certain disease of the ovaries (polycystic ovarian syndrome). Metformin may make menstrual cycles more regular and increase fertility.    HOW TO USE:  Read the Patient Information Leaflet if available from your pharmacist before you start taking metformin and each time you get a refill. If you have any questions, consult your doctor or pharmacist. Take this medication by mouth as directed by your doctor, usually 1-3 times a day with meals. Drink plenty of fluids while taking this medication unless otherwise directed by your doctor. The dosage is based on your medical condition, kidney function, and response to treatment. Your doctor may direct you to take a low dose of this  medication at first, gradually increasing your dose to lower the chance of side effects such as upset stomach. Your doctor will adjust your dose based on your blood sugar levels to find the best dose for you. Follow your doctor's directions  carefully. Take this medication regularly in order to get the most benefit from it. Remember to use it at the same times each day. If you are already taking another anti-diabetic drug (such as chlorpropamide), follow your doctor's directions carefully for stopping/continuing the old drug and starting metformin. Check your blood sugar regularly as directed by your doctor. Keep track of the results, and share them with your doctor. Tell your doctor if your blood sugar measurements are too high or too low. Your dosage/treatment may need to be changed.    SIDE EFFECTS:  Nausea, vomiting, stomach upset, diarrhea, weakness, or a metallic taste in the mouth may occur. If any of these effects persist or worsen, tell your doctor or pharmacist promptly. If stomach symptoms return later (after taking the same dose for several days or weeks), tell your doctor immediately. Stomach symptoms that occur after the first days of your treatment may be signs of lactic acidosis. Remember that your doctor has prescribed this medication because he or she has judged that the benefit to you is greater than the risk of side effects. Many people using this medication do not have serious side effects. Metformin does not usually cause low blood sugar (hypoglycemia). Low blood sugar may occur if this drug is prescribed with other anti-diabetic medications. Talk with your doctor or pharmacist about whether the dose of your other diabetic medication(s) needs to be lowered. Symptoms of low blood sugar include sudden sweating, shaking, fast heartbeat, hunger, blurred vision, dizziness, or tingling hands/feet. It is a good habit to carry glucose tablets or gel to treat low blood sugar. If you don't have these reliable forms of glucose, rapidly raise your blood sugar by eating a quick source of sugar such as table sugar, honey, or candy, or drink fruit juice or non-diet soda. Tell your doctor about the reaction immediately. Low blood sugar is more  likely if you drink large amounts of alcohol, do unusually heavy exercise, or do not consume enough calories from food. To help prevent low blood sugar, eat meals on a regular schedule, and do not skip meals. Check with your doctor or pharmacist to find out what you should do if you miss a meal. Symptoms of high blood sugar (hyperglycemia) include thirst, increased urination, confusion, drowsiness, flushing, rapid breathing, and fruity breath odor. If these symptoms occur, tell your doctor immediately. Your doctor may need to adjust your diabetes medication(s). Stop taking this medication and tell your doctor right away if this very serious side effect occurs: lactic acidosis (see Warning section). A very serious allergic reaction to this drug is rare. However, get medical help right away if you notice any of the following symptoms of a serious allergic reaction: rash, itching/swelling (especially of the face/tongue/throat), severe dizziness, trouble breathing. This is not a complete list of possible side effects. If you notice other effects not listed above, contact your doctor or pharmacist. In the [place] - Call your doctor for medical advice about side effects. You may report side effects to FDA at 1-800-FDA-1088. In [place] - Call your doctor for medical advice about side effects. You may report side effects to [facility] [place] at [phone].    PRECAUTIONS:  See also Warning section. Before taking this medication, tell your doctor  or pharmacist if you are allergic to metformin; or if you have any other allergies. This product may contain inactive ingredients, which can cause allergic reactions or other problems. Talk to your pharmacist for more details. Before using this medication, tell your doctor or pharmacist your medical history, especially of: severe breathing problems (such as obstructive lung disease, severe asthma), metabolic acidosis (such as diabetic ketoacidosis), blood problems (such as anemia,  vitamin B12 deficiency), kidney disease, liver disease. Before having surgery or any X-ray/scanning procedure using injectable iodinated contrast material, tell your doctor that you are taking this medication. You will need to temporarily stop this medication before the time of your surgery/procedure. Consult your doctor for further instructions. Before having surgery, tell your doctor or dentist about all the products you use (including prescription drugs, nonprescription drugs, and herbal products). You may experience blurred vision, dizziness, or drowsiness due to extremely low or high blood sugar levels. Do not drive, use machinery, or do any activity that requires alertness or clear vision until you are sure you can perform such activities safely. Limit alcohol while using this medication because it can increase your risk of lactic acidosis and developing low blood sugar. High fever, "water pills" (diuretics such as hydrochlorothiazide), too much sweating, diarrhea, or vomiting may cause loss of too much body water (dehydration) and increase your risk of lactic acidosis. Stop taking this medication and tell your doctor right away if you have prolonged diarrhea or vomiting. Be sure to drink enough fluids to prevent dehydration unless your doctor directs you otherwise. It may be harder to control your blood sugar when your body is stressed (such as due to fever, infection, injury, or surgery). Consult your doctor because increased stress may require a change in your treatment plan, medications, or blood sugar testing. Older adults may be at greater risk for side effects such as low blood sugar or lactic acidosis. During pregnancy, this medication should be used only when clearly needed. Discuss the risks and benefits with your doctor. Your doctor may direct you to use insulin instead of this product during your pregnancy. Follow your doctor's instructions carefully. Metformin can cause changes in the menstrual  cycle (promote ovulation) and increase the risk of becoming pregnant. Consult your doctor or pharmacist about the use of reliable birth control while using this medication. Metformin passes into breast milk in small amounts. Consult your doctor before breast-feeding.    DRUG INTERACTIONS:  Drug interactions may change how your medications work or increase your risk for serious side effects. This document does not contain all possible drug interactions. Keep a list of all the products you use (including prescription/nonprescription drugs and herbal products) and share it with your doctor and pharmacist. Do not start, stop, or change the dosage of any medicines without your doctor's approval.    OVERDOSE:  If overdose is suspected, contact a poison control center or emergency room immediately. [place] residents can call the [place] National Poison Hotline at 778-574-2461. [place] residents can call a Technical sales engineer center. Overdose can cause lactic acidosis. Symptoms of overdose may include: severe drowsiness, severe nausea/vomiting/diarrhea, rapid breathing, slow/irregular heartbeat.    NOTES:  Do not share this medication with others. You should attend a diabetes education program to learn more about diabetes and all the important aspects of its treatment, including meals/diet, exercise, personal hygiene, medications, and getting regular eye/foot/medical exams. Keep all medical appointments. Laboratory and/or medical tests (such as liver/kidney function tests, blood glucose, hemoglobin A1c, complete blood counts)  should be performed periodically to check for side effects and monitor your response to treatment. Check your blood sugar levels regularly as directed.    MISSED DOSE:  If you miss a dose, take it as soon as you remember with food. If it is near the time of the next dose, skip the missed dose and resume your usual dosing schedule. Do not double the dose to catch up.    STORAGE:  Store at room  temperature away from light and moisture. Do not store in the bathroom. Keep all medications away from children and pets. Do not flush medications down the toilet or pour them into a drain unless instructed to do so. Properly discard this product when it is expired or no longer needed. Consult your pharmacist or local waste disposal company for more details about how to safely discard your product.    MEDICAL ALERT:  Your condition can cause complications in a medical emergency. For information about enrolling in [place], call [phone] ([place]) or [phone] ([place]).    Information last revised October 2010 Copyright(c) 2010 [employer].

## 2011-03-15 ENCOUNTER — Encounter: Payer: Self-pay | Admitting: *Deleted

## 2011-04-17 ENCOUNTER — Ambulatory Visit: Payer: Self-pay | Admitting: Family Medicine

## 2011-04-18 ENCOUNTER — Encounter: Payer: Self-pay | Admitting: Family Medicine

## 2011-04-19 ENCOUNTER — Encounter: Payer: Self-pay | Admitting: Family Medicine

## 2011-04-19 ENCOUNTER — Encounter: Payer: Self-pay | Admitting: *Deleted

## 2011-04-19 ENCOUNTER — Ambulatory Visit (INDEPENDENT_AMBULATORY_CARE_PROVIDER_SITE_OTHER): Payer: Managed Care, Other (non HMO) | Admitting: Family Medicine

## 2011-04-19 VITALS — BP 132/80 | HR 78 | Temp 98.5°F | Wt 249.2 lb

## 2011-04-19 DIAGNOSIS — F411 Generalized anxiety disorder: Secondary | ICD-10-CM

## 2011-04-19 DIAGNOSIS — F419 Anxiety disorder, unspecified: Secondary | ICD-10-CM

## 2011-04-19 DIAGNOSIS — R42 Dizziness and giddiness: Secondary | ICD-10-CM

## 2011-04-19 MED ORDER — ALPRAZOLAM 0.25 MG PO TABS: 0.2500 mg | ORAL_TABLET | Freq: Three times a day (TID) | ORAL | Status: AC | PRN

## 2011-04-19 MED ORDER — MECLIZINE HCL 25 MG PO TABS: 25.0000 mg | ORAL_TABLET | Freq: Three times a day (TID) | ORAL | Status: AC | PRN

## 2011-04-19 NOTE — Patient Instructions (Signed)
Vertigo  (Dizziness) Vertigo is a feeling that you are unsteady or dizzy. You may feel that you or things around you are moving. Vertigo causes a spinning feeling. It can make you feel off balance or may give you a whirling feeling. A change in your position can make it worse. Resting can make it better.  HOME CARE  Rest in bed.   Drink clear liquids.   Take medicine to lessen dizziness, nausea (feeling sick to your stomach), and vomiting (throwing up).   Avoid alcohol, tranquilizers, nicotine, caffeine and street drugs.   GET HELP RIGHT AWAY IF:  Your vertigo gets worse.   You have an earache, ear drainage or hearing loss.   You have a bad headache, blurred or double vision, or trouble walking.   You faint or have extreme weakness, chest pain or rapid heart beat (palpitations).   You get a fever or throw up continuously.   You get numb or weak limbs.  Document Released: 05/22/2008 Document Re-Released: 06/09/2009 [facility] Patient Information 2011 [place], [place].

## 2011-04-19 NOTE — Progress Notes (Signed)
  Subjective:    Patient ID: KENNESHA BREWBAKER, female    DOB: 06-May-1946, 65 y.o.   MRN: [id number]  HPI  Vertigo - Dizziness Patient presents with dizziness .  The dizziness has been present for 1 week. The patient describes the symptoms as disequalibirum and vertigo. Symptoms are exacerbated by rolling over in bed, motion and driving The patient also complains of none. Patient denies aural pressure otalgia otorrhea tinnitus hearing loss.    No recent h/o head trauma. Has known h/o anxiety, feels very anxious when she has vertigo. No CP or SOB.  Review of Systems See HPI No nausea, vomiting or focal neurological deficits.    Objective:   Physical Exam BP 132/80  Pulse 78  Temp(Src) 98.5 F (36.9 C) (Oral)  Wt 249 lb 4 oz (113.059 kg) Gen:  Alert, NAD HEENT:  Pos nystagmus with rotation of head to left, limited exam as she has difficulty getting on and off the table. TMS clear bilaterally Resp:  CTA bilaterally CVS:  RRR Psych:  Slightly anxious, good eye contact.       Assessment & Plan:   1. Vertigo   New, likely vestibular.  Will treat with Meclizine. Supportive care and follow up as per pt instructions. meclizine (ANTIVERT) 25 MG tablet  2. Anxiety   Worsened by vertigo spells. Rx given for as needed xanax. ALPRAZolam (XANAX) 0.25 MG tablet

## 2011-05-03 ENCOUNTER — Other Ambulatory Visit: Payer: Self-pay | Admitting: Family Medicine

## 2011-05-04 NOTE — Telephone Encounter (Signed)
Not sure why you received this refill, I don't recall sending it to you.  Patient was recently seen and this Rx could have been refilled without your approval.  Thanks.

## 2011-05-04 NOTE — Telephone Encounter (Signed)
Was there a concern about refilling her BP medication?  Not sure why I received it. I will go ahead and refill but please let me know if there was a concern, i.e wrong dosage, etc.

## 2011-06-19 ENCOUNTER — Other Ambulatory Visit: Payer: Self-pay | Admitting: Family Medicine

## 2011-06-29 ENCOUNTER — Other Ambulatory Visit: Payer: Self-pay | Admitting: Family Medicine

## 2011-09-17 ENCOUNTER — Other Ambulatory Visit: Payer: Self-pay | Admitting: *Deleted

## 2011-09-17 MED ORDER — TRIAMTERENE-HCTZ 37.5-25 MG PO TABS: 1.0000 | ORAL_TABLET | Freq: Every day | ORAL | Status: DC

## 2012-03-09 ENCOUNTER — Other Ambulatory Visit: Payer: Self-pay | Admitting: Family Medicine

## 2012-03-25 ENCOUNTER — Other Ambulatory Visit: Payer: Self-pay | Admitting: Family Medicine

## 2012-03-25 DIAGNOSIS — I10 Essential (primary) hypertension: Secondary | ICD-10-CM

## 2012-03-25 DIAGNOSIS — R739 Hyperglycemia, unspecified: Secondary | ICD-10-CM

## 2012-03-25 DIAGNOSIS — Z136 Encounter for screening for cardiovascular disorders: Secondary | ICD-10-CM

## 2012-03-25 DIAGNOSIS — Z0000 Encounter for general adult medical examination without abnormal findings: Secondary | ICD-10-CM

## 2012-03-27 ENCOUNTER — Other Ambulatory Visit (INDEPENDENT_AMBULATORY_CARE_PROVIDER_SITE_OTHER): Payer: Medicare Other

## 2012-03-27 DIAGNOSIS — Z136 Encounter for screening for cardiovascular disorders: Secondary | ICD-10-CM

## 2012-03-27 DIAGNOSIS — Z0000 Encounter for general adult medical examination without abnormal findings: Secondary | ICD-10-CM

## 2012-03-27 DIAGNOSIS — R7309 Other abnormal glucose: Secondary | ICD-10-CM

## 2012-03-27 DIAGNOSIS — R739 Hyperglycemia, unspecified: Secondary | ICD-10-CM

## 2012-03-27 LAB — COMPREHENSIVE METABOLIC PANEL
CO2: 26 mEq/L (ref 19–32)
Creatinine, Ser: 0.9 mg/dL (ref 0.4–1.2)
GFR: 65.38 mL/min (ref >60.00)
Glucose, Bld: 108 mg/dL — ABNORMAL HIGH (ref 70–99)
Total Bilirubin: 0.7 mg/dL (ref 0.3–1.2)

## 2012-03-27 LAB — LIPID PANEL
Cholesterol: 150 mg/dL (ref 0–200)
HDL: 59.9 mg/dL (ref >39.00)
VLDL: 31 mg/dL (ref 0.0–40.0)

## 2012-03-27 LAB — HEMOGLOBIN A1C: Hgb A1c MFr Bld: 5.3 % (ref 4.6–6.5)

## 2012-04-02 ENCOUNTER — Ambulatory Visit (INDEPENDENT_AMBULATORY_CARE_PROVIDER_SITE_OTHER): Payer: Medicare Other | Admitting: Family Medicine

## 2012-04-02 ENCOUNTER — Encounter: Payer: Self-pay | Admitting: Family Medicine

## 2012-04-02 ENCOUNTER — Encounter: Payer: Self-pay | Admitting: Internal Medicine

## 2012-04-02 VITALS — BP 118/80 | HR 64 | Temp 97.8°F | Ht 63.25 in | Wt 243.0 lb

## 2012-04-02 DIAGNOSIS — R0789 Other chest pain: Secondary | ICD-10-CM

## 2012-04-02 DIAGNOSIS — R9431 Abnormal electrocardiogram [ECG] [EKG]: Secondary | ICD-10-CM

## 2012-04-02 DIAGNOSIS — Z0000 Encounter for general adult medical examination without abnormal findings: Secondary | ICD-10-CM

## 2012-04-02 DIAGNOSIS — Z1211 Encounter for screening for malignant neoplasm of colon: Secondary | ICD-10-CM

## 2012-04-02 DIAGNOSIS — Z1239 Encounter for other screening for malignant neoplasm of breast: Secondary | ICD-10-CM

## 2012-04-02 DIAGNOSIS — Z23 Encounter for immunization: Secondary | ICD-10-CM

## 2012-04-02 MED ORDER — METFORMIN HCL ER 500 MG PO TB24: 500.0000 mg | ORAL_TABLET | Freq: Every day | ORAL | Status: DC

## 2012-04-02 NOTE — Patient Instructions (Signed)
Check with your insurance to see if they will cover the shingles shot. Please stop by to see Asher Muir on your way out.

## 2012-04-02 NOTE — Addendum Note (Signed)
Addended by: Dianne Dun on: 04/02/2012 09:46 AM   Modules accepted: Orders

## 2012-04-02 NOTE — Progress Notes (Addendum)
66 yo here for annual medicare wellness visit.  I have personally reviewed the Medicare Annual Wellness questionnaire and have noted 1. The patient's medical and social history 2. Their use of alcohol, tobacco or illicit drugs 3. Their current medications and supplements 4. The patient's functional ability including ADL's, fall risks, home safety risks and hearing or visual             impairment. 5. Diet and physical activities 6. Evidence for depression or mood disorders  1.  Peripheral neuropathy- borderline diabetic but she has had neuropathy in her feet and toes for years.  Per pt, due to her multiple other co morbidities. Feet have been really tingling lately.  Never been on any medication for neuropathy, like gabapentin or lyrica.  2.  HTN- takes Maxzide 1 tab daily, Toprol 25 mg daily, and Ramipril 5 mg daily. Denies any blurred vision, CP, or SOB. Has some LE edema, no worse than usual. BP stable today. Lab Results  Component Value Date   CREATININE 0.9 03/27/2012     3.  Hyperglycemia- CBG elevated at 108 ( down from 121 last year).  a1c reassuring. Lab Results  Component Value Date   HGBA1C 5.3 03/27/2012   4.  CP- mentions that from she has had periodic episodes of chest pain.  She is not sure if it has occurred with exertion but she it has occurred at rest. She is frequently SOB after she takes a shower or walks but she is aware that she is overweight and out of shape. Never associated with diaphoresis, nausea or vomiting. Former smoker. No h/o HLD. No known FH of CAD. H/o chest pain in 2006 with stress test at that time, followed by cardiac catheterization in July 2006 that showed no significant coronary artery disease   Lab Results  Component Value Date   CHOL 150 03/27/2012   HDL 59.90 03/27/2012   LDLCALC 59 03/27/2012   TRIG 155.0* 03/27/2012   CHOLHDL 3 03/27/2012     4.  Depression- stable. Does not want medication, felt like zombie.  At times, she gets a  little tearful when she worries about her husband's health problems and finances. She denies any SI or HI and overall feels like she is doing well.  Well woman- IFOB neg in 11/2010 and has not wanted colonoscopy in past.  She agrees to go ahead and schedule one now. Due for mammogram.     ROS: See HPI   Patient Active Problem List  Diagnosis  . Depression  . Hypertension  . GERD (gastroesophageal reflux disease)  . Hyperglycemia  . DJD (degenerative joint disease)  . Restless leg  . Peripheral neuropathy  . Routine general medical examination at a health care facility  . Vertigo  . Anxiety   Past Medical History  Diagnosis Date  . Depression   . Hypertension   . GERD (gastroesophageal reflux disease)   . Hyperglycemia   . DJD (degenerative joint disease)   . Restless leg   . Peripheral neuropathy    Past Surgical History  Procedure Date  . Right knee replacement 2008  . Tubal ligation 1976  . Appendectomy 1976  . Tonsillectomy childhood   History  Substance Use Topics  . Smoking status: Former Games developer  . Smokeless tobacco: Not on file  . Alcohol Use: Not on file   Family History  Problem Relation Age of Onset  . Parkinsonism Mother    No Known Allergies Current Outpatient Prescriptions on File  Prior to Visit  Medication Sig Dispense Refill  . ALPRAZolam (XANAX) 0.25 MG tablet Take 1 tablet (0.25 mg total) by mouth 3 (three) times daily as needed for anxiety.  30 tablet  0  . aspirin 81 MG tablet Take 81 mg by mouth daily.        . meclizine (ANTIVERT) 25 MG tablet Take 1 tablet (25 mg total) by mouth 3 (three) times daily as needed for dizziness or nausea.  30 tablet  1  . metoprolol (TOPROL-XL) 50 MG 24 hr tablet TAKE 1 TABLET BY MOUTH ONCE A DAY  90 tablet  3  . omeprazole (PRILOSEC) 10 MG capsule Take 10 mg by mouth daily.        . ramipril (ALTACE) 5 MG capsule TAKE ONE CAPSULE BY MOUTH EVERY DAY  90 capsule  3  . rOPINIRole (REQUIP) 1 MG tablet Take 1 mg  by mouth 3 (three) times daily.        Marland Kitchen triamterene-hydrochlorothiazide (MAXZIDE-25) 37.5-25 MG per tablet Take 1 each (1 tablet total) by mouth daily.  90 tablet  3  . DISCONTD: metFORMIN (GLUCOPHAGE-XR) 500 MG 24 hr tablet TAKE 1 TABLET (500 MG TOTAL) BY MOUTH DAILY WITH BREAKFAST.  30 tablet  1     The PMH, PSH, Social History, Family History, Medications, and allergies have been reviewed in [facility], and have been updated if relevant.  Physical exam: BP 118/80  Pulse 64  Temp 97.8 F (36.6 C)  Ht 5' 3.25" (1.607 m)  Wt 243 lb (110.224 kg)  BMI 42.71 kg/m2  General:  Well-developed,overweight, well-nourished,in no acute distress; alert,appropriate and cooperative throughout examination Head:  normocephalic and atraumatic.   Eyes:  vision grossly intact, pupils equal, pupils round, and pupils reactive to light.   Ears:  R ear normal and L ear normal.   Nose:  no external deformity.   Mouth:  good dentition.   Neck:  No deformities, masses, or tenderness noted. Lungs:  Normal respiratory effort, chest expands symmetrically. Lungs are clear to auscultation, no crackles or wheezes. Heart:  Normal rate and regular rhythm. S1 and S2 normal without gallop, murmur, click, rub or other extra sounds. Abdomen:  Bowel sounds positive,abdomen soft and non-tender without masses, organomegaly or hernias noted. Msk:  No deformity or scoliosis noted of thoracic or lumbar spine.   Extremities:  No clubbing, cyanosis, edema, or deformity noted with normal full range of motion of all joints.   Neurologic:  alert & oriented X3 and gait normal.   Skin:  Intact without suspicious lesions or rashes, +multiple SK Psych:  Cognition and judgment appear intact. Alert and cooperative with normal attention span and concentration. No apparent delusions, illusions, hallucinations     Assessment and Plan: 1. Routine general medical examination at a health care facility  The patients weight, height, BMI and visual  acuity have been recorded in the chart I have made referrals, counseling and provided education to the patient based review of the above and I have provided the pt with a written personalized care plan for preventive services.   Orders Placed This Encounter  Procedures  . MM Digital Screening  . Ambulatory referral to Gastroenterology     2. Hypertension  Stable.  3 Hyperglycemia   Improved.  Continue Metformin.  4. Screening for colon cancer  Ambulatory referral to Gastroenterology  5. Screening for breast cancer  MM Digital Screening  6. Chest pain, atypical   New- with low risk factors other than  obesity. EKG is abnormal- ? Lead placement- lead II almost flat, inverted P waves, axis seems ok. Will refer to cardiology for urgent evaluation. The patient indicates understanding of these issues and agrees with the plan.

## 2012-04-02 NOTE — Addendum Note (Signed)
Addended by: Eliezer Bottom on: 04/02/2012 10:48 AM   Modules accepted: Orders

## 2012-04-04 ENCOUNTER — Ambulatory Visit (INDEPENDENT_AMBULATORY_CARE_PROVIDER_SITE_OTHER): Payer: Medicare Other | Admitting: Cardiovascular Disease

## 2012-04-04 ENCOUNTER — Encounter: Payer: Self-pay | Admitting: Cardiovascular Disease

## 2012-04-04 VITALS — BP 140/80 | HR 77 | Ht 63.0 in | Wt 245.8 lb

## 2012-04-04 DIAGNOSIS — F419 Anxiety disorder, unspecified: Secondary | ICD-10-CM

## 2012-04-04 DIAGNOSIS — R0789 Other chest pain: Secondary | ICD-10-CM

## 2012-04-04 DIAGNOSIS — R079 Chest pain, unspecified: Secondary | ICD-10-CM

## 2012-04-04 DIAGNOSIS — I10 Essential (primary) hypertension: Secondary | ICD-10-CM

## 2012-04-04 DIAGNOSIS — F411 Generalized anxiety disorder: Secondary | ICD-10-CM

## 2012-04-04 DIAGNOSIS — R002 Palpitations: Secondary | ICD-10-CM

## 2012-04-04 DIAGNOSIS — R0602 Shortness of breath: Secondary | ICD-10-CM

## 2012-04-04 NOTE — Assessment & Plan Note (Signed)
She does report having significant stressors at home as her husband has numerous medical issues. This could be contributing to her stress.

## 2012-04-04 NOTE — Patient Instructions (Addendum)
You are doing well. No medication changes were made.  Please call the office if you have worsening chest pain or palpitations  Please call us if you have new issues that need to be addressed before your next appt.

## 2012-04-04 NOTE — Assessment & Plan Note (Signed)
I suspect her chest pain is noncardiac. She had a catheterization in 2006 showing no significant coronary artery disease. We have suggested she call our office for worsening symptoms and we could repeat a pharmacologic Myoview if needed. I tried to provide reassurance that symptoms are likely from stress.

## 2012-04-04 NOTE — Assessment & Plan Note (Signed)
Likely ectopy. She can take extra metoprolol as needed for worsening palpitations. If symptoms get worse, Holter monitor could be performed. This was discussed with her.

## 2012-04-04 NOTE — Assessment & Plan Note (Signed)
Blood pressure is well controlled on today's visit. No changes made to the medications. 

## 2012-04-04 NOTE — Progress Notes (Signed)
Patient ID: [name], female    DOB: [birth year], 66 y.o.   MRN: [id number]  HPI Comments: Ms. [name] is a 66 year old woman with obesity, previous smoking history for 20 years who stopped 30 years ago, chest pain in 2006 with stress test at that time, followed by cardiac catheterization in July 2006 that showed no significant coronary artery disease who presents by referral for abnormal EKG and symptoms of chest pain.  She reports that over the past several years, she has had episodes of chest pain. It is in her left breast area radiating to her left arm. Symptoms have been worse over the past several months. She describes the arm pain as a dull ache it typically comes on when her left chest hurts. It comes on at rest, sometimes with stress. She has had vague symptoms when she is in the shower that are rare with symptoms of dizziness where she has to lie down. Stress also makes her feel dizzy and she has to lie down. She has significant stress as her husband has numerous medical issues. She has been feeling very tired, has leg weakness, chronic neck pain, has had a decline in her ability to exert herself.  She reports mother has longevity, father had coronary disease and bypass in his 9s  EKG shows normal sinus rhythm with rate 77 beats per minute, left axis deviation/left anterior fascicular block   Outpatient Encounter Prescriptions as of 04/04/2012  Medication Sig Dispense Refill  . ALPRAZolam (XANAX) 0.25 MG tablet Take 1 tablet (0.25 mg total) by mouth 3 (three) times daily as needed for anxiety.  30 tablet  0  . aspirin 81 MG tablet Take 81 mg by mouth daily.        . meclizine (ANTIVERT) 25 MG tablet Take 1 tablet (25 mg total) by mouth 3 (three) times daily as needed for dizziness or nausea.  30 tablet  1  . metFORMIN (GLUCOPHAGE-XR) 500 MG 24 hr tablet Take 1 tablet (500 mg total) by mouth daily with breakfast.  90 tablet  3  . metoprolol (TOPROL-XL) 50 MG 24 hr tablet TAKE 1 TABLET  BY MOUTH ONCE A DAY  90 tablet  3  . omeprazole (PRILOSEC) 10 MG capsule Every other day.      . ramipril (ALTACE) 5 MG capsule TAKE ONE CAPSULE BY MOUTH EVERY DAY  90 capsule  3  . rOPINIRole (REQUIP) 1 MG tablet Take 1 mg by mouth as needed.       . triamterene-hydrochlorothiazide (MAXZIDE-25) 37.5-25 MG per tablet Take 1 each (1 tablet total) by mouth daily.  90 tablet  3    Review of Systems  Constitutional: Positive for fatigue.  HENT: Negative.   Eyes: Negative.   Cardiovascular: Positive for chest pain and palpitations.  Gastrointestinal: Negative.   Musculoskeletal: Negative.   Skin: Negative.   Neurological: Positive for dizziness.  Hematological: Negative.   Psychiatric/Behavioral: Negative.   All other systems reviewed and are negative.    BP 140/80  Pulse 77  Ht 5\' 3"  (1.6 m)  Wt 245 lb 12 oz (111.471 kg)  BMI 43.53 kg/m2  Physical Exam  Nursing note and vitals reviewed. Constitutional: She is oriented to person, place, and time. She appears well-developed and well-nourished.       Obese  HENT:  Head: Normocephalic.  Nose: Nose normal.  Mouth/Throat: Oropharynx is clear and moist.  Eyes: Conjunctivae are normal. Pupils are equal, round, and reactive to light.  Neck: Normal  range of motion. Neck supple. No JVD present.  Cardiovascular: Normal rate, regular rhythm, S1 normal, S2 normal, normal heart sounds and intact distal pulses.  Exam reveals no gallop and no friction rub.   No murmur heard. Pulmonary/Chest: Effort normal and breath sounds normal. No respiratory distress. She has no wheezes. She has no rales. She exhibits no tenderness.  Abdominal: Soft. Bowel sounds are normal. She exhibits no distension. There is no tenderness.  Musculoskeletal: Normal range of motion. She exhibits no edema and no tenderness.  Lymphadenopathy:    She has no cervical adenopathy.  Neurological: She is alert and oriented to person, place, and time. Coordination normal.  Skin:  Skin is warm and dry. No rash noted. No erythema.  Psychiatric: She has a normal mood and affect. Her behavior is normal. Judgment and thought content normal.         Assessment and Plan

## 2012-04-17 ENCOUNTER — Ambulatory Visit: Payer: Self-pay | Admitting: Family Medicine

## 2012-04-18 ENCOUNTER — Encounter: Payer: Self-pay | Admitting: Family Medicine

## 2012-04-22 ENCOUNTER — Encounter: Payer: Self-pay | Admitting: *Deleted

## 2012-04-22 ENCOUNTER — Encounter: Payer: Self-pay | Admitting: Family Medicine

## 2012-04-24 ENCOUNTER — Other Ambulatory Visit: Payer: Self-pay | Admitting: Family Medicine

## 2012-05-08 ENCOUNTER — Encounter: Payer: Medicare Other | Admitting: Internal Medicine

## 2012-06-13 ENCOUNTER — Other Ambulatory Visit: Payer: Self-pay | Admitting: Family Medicine

## 2012-07-21 ENCOUNTER — Encounter: Payer: Self-pay | Admitting: Family Medicine

## 2012-07-21 ENCOUNTER — Observation Stay (HOSPITAL_COMMUNITY): Payer: Medicare Other

## 2012-07-21 ENCOUNTER — Other Ambulatory Visit: Payer: Medicare Other

## 2012-07-21 ENCOUNTER — Observation Stay (HOSPITAL_COMMUNITY): Payer: Medicare Other | Admitting: Anesthesiology

## 2012-07-21 ENCOUNTER — Inpatient Hospital Stay (HOSPITAL_COMMUNITY)
Admission: EM | Admit: 2012-07-21 | Discharge: 2012-07-23 | DRG: 354 | Disposition: A | Discharge status: Home / Self Care | Payer: Medicare Other | Attending: General Surgery | Admitting: General Surgery

## 2012-07-21 ENCOUNTER — Encounter (HOSPITAL_COMMUNITY): Payer: Self-pay | Admitting: *Deleted

## 2012-07-21 ENCOUNTER — Encounter (HOSPITAL_COMMUNITY): Admission: EM | Disposition: A | Discharge status: Home / Self Care | Payer: Self-pay | Source: Home / Self Care

## 2012-07-21 ENCOUNTER — Ambulatory Visit (INDEPENDENT_AMBULATORY_CARE_PROVIDER_SITE_OTHER): Payer: Medicare Other | Admitting: Family Medicine

## 2012-07-21 ENCOUNTER — Encounter (HOSPITAL_COMMUNITY): Payer: Self-pay | Admitting: Anesthesiology

## 2012-07-21 VITALS — BP 146/86 | HR 84 | Temp 98.1°F | Wt 239.0 lb

## 2012-07-21 DIAGNOSIS — K469 Unspecified abdominal hernia without obstruction or gangrene: Secondary | ICD-10-CM

## 2012-07-21 DIAGNOSIS — E119 Type 2 diabetes mellitus without complications: Secondary | ICD-10-CM | POA: Diagnosis present

## 2012-07-21 DIAGNOSIS — K420 Umbilical hernia with obstruction, without gangrene: Secondary | ICD-10-CM

## 2012-07-21 DIAGNOSIS — F411 Generalized anxiety disorder: Secondary | ICD-10-CM | POA: Diagnosis present

## 2012-07-21 DIAGNOSIS — K429 Umbilical hernia without obstruction or gangrene: Secondary | ICD-10-CM

## 2012-07-21 DIAGNOSIS — I10 Essential (primary) hypertension: Secondary | ICD-10-CM | POA: Diagnosis present

## 2012-07-21 DIAGNOSIS — K219 Gastro-esophageal reflux disease without esophagitis: Secondary | ICD-10-CM | POA: Diagnosis present

## 2012-07-21 DIAGNOSIS — Z6841 Body Mass Index (BMI) 40.0 and over, adult: Secondary | ICD-10-CM

## 2012-07-21 DIAGNOSIS — K439 Ventral hernia without obstruction or gangrene: Secondary | ICD-10-CM | POA: Diagnosis present

## 2012-07-21 DIAGNOSIS — K436 Other and unspecified ventral hernia with obstruction, without gangrene: Secondary | ICD-10-CM

## 2012-07-21 HISTORY — PX: OMENTECTOMY: SHX5985

## 2012-07-21 HISTORY — PX: UMBILICAL HERNIA REPAIR: SHX196

## 2012-07-21 LAB — COMPREHENSIVE METABOLIC PANEL
ALT: 37 U/L — ABNORMAL HIGH (ref 0–35)
AST: 23 U/L (ref 0–37)
Albumin: 4.1 g/dL (ref 3.5–5.2)
BUN: 16 mg/dL (ref 6–23)
Calcium: 9.8 mg/dL (ref 8.4–10.5)
Chloride: 104 mEq/L (ref 96–112)
Potassium: 3.8 mEq/L (ref 3.5–5.1)
Total Protein: 7.3 g/dL (ref 6.0–8.3)

## 2012-07-21 LAB — APTT: aPTT: 29 seconds (ref 24–37)

## 2012-07-21 LAB — CBC WITH DIFFERENTIAL/PLATELET
Basophils Relative: 0.7 % (ref 0.0–3.0)
Eosinophils Absolute: 0.3 10*3/uL (ref 0.0–0.7)
Lymphocytes Relative: 28.8 % (ref 12.0–46.0)
MCHC: 33.4 g/dL (ref 30.0–36.0)
Neutrophils Relative %: 59 % (ref 43.0–77.0)
RBC: 4.96 Mil/uL (ref 3.87–5.11)
WBC: 11.4 10*3/uL — ABNORMAL HIGH (ref 4.5–10.5)

## 2012-07-21 LAB — GLUCOSE, CAPILLARY
Glucose-Capillary: 117 mg/dL — ABNORMAL HIGH (ref 70–99)
Glucose-Capillary: 159 mg/dL — ABNORMAL HIGH (ref 70–99)
Glucose-Capillary: 159 mg/dL — ABNORMAL HIGH (ref 70–99)

## 2012-07-21 LAB — CBC
MCH: 28.4 pg (ref 26.0–34.0)
Platelets: 238 10*3/uL (ref 150–400)
RBC: 4.5 MIL/uL (ref 3.87–5.11)
WBC: 9.8 10*3/uL (ref 4.0–10.5)

## 2012-07-21 LAB — CREATININE, SERUM
Creatinine, Ser: 0.84 mg/dL (ref 0.50–1.10)
GFR calc non Af Amer: 70 mL/min — ABNORMAL LOW (ref >90)

## 2012-07-21 LAB — PROTIME-INR
INR: 0.94 (ref 0.00–1.49)
Prothrombin Time: 12.5 seconds (ref 11.6–15.2)

## 2012-07-21 SURGERY — REPAIR, HERNIA, UMBILICAL, ADULT
Anesthesia: General | Site: Abdomen | Wound class: Clean

## 2012-07-21 MED ORDER — OXYCODONE-ACETAMINOPHEN 5-325 MG PO TABS
1.0000 | ORAL_TABLET | ORAL | Status: DC | PRN
  Administered 2012-07-22 (×2): 1 via ORAL
  Administered 2012-07-23 (×2): 2 via ORAL
  Filled 2012-07-21: qty 2
  Filled 2012-07-21: qty 1
  Filled 2012-07-21: qty 2
  Filled 2012-07-21: qty 1

## 2012-07-21 MED ORDER — MORPHINE SULFATE 2 MG/ML IJ SOLN
2.0000 mg | INTRAMUSCULAR | Status: DC | PRN
  Administered 2012-07-22: 2 mg via INTRAVENOUS
  Administered 2012-07-22: 4 mg via INTRAVENOUS
  Filled 2012-07-21: qty 2
  Filled 2012-07-21: qty 1

## 2012-07-21 MED ORDER — PROPOFOL 10 MG/ML IV BOLUS
INTRAVENOUS | Status: DC | PRN
  Administered 2012-07-21: 150 mg via INTRAVENOUS

## 2012-07-21 MED ORDER — HEPARIN SODIUM (PORCINE) 5000 UNIT/ML IJ SOLN
5000.0000 [IU] | Freq: Three times a day (TID) | INTRAMUSCULAR | Status: DC
  Administered 2012-07-22 – 2012-07-23 (×4): 5000 [IU] via SUBCUTANEOUS
  Filled 2012-07-21 (×7): qty 1

## 2012-07-21 MED ORDER — PANTOPRAZOLE SODIUM 40 MG PO TBEC
40.0000 mg | DELAYED_RELEASE_TABLET | Freq: Every day | ORAL | Status: DC
  Administered 2012-07-21 – 2012-07-23 (×3): 40 mg via ORAL
  Filled 2012-07-21 (×4): qty 1

## 2012-07-21 MED ORDER — ONDANSETRON HCL 4 MG/2ML IJ SOLN: 4.0000 mg | Freq: Four times a day (QID) | INTRAMUSCULAR | Status: DC | PRN

## 2012-07-21 MED ORDER — ONDANSETRON HCL 4 MG/2ML IJ SOLN
4.0000 mg | Freq: Four times a day (QID) | INTRAMUSCULAR | Status: DC | PRN
  Administered 2012-07-21: 4 mg via INTRAVENOUS
  Filled 2012-07-21: qty 2

## 2012-07-21 MED ORDER — ROCURONIUM BROMIDE 100 MG/10ML IV SOLN
INTRAVENOUS | Status: DC | PRN
  Administered 2012-07-21: 25 mg via INTRAVENOUS

## 2012-07-21 MED ORDER — ENOXAPARIN SODIUM 40 MG/0.4ML ~~LOC~~ SOLN
40.0000 mg | SUBCUTANEOUS | Status: DC
  Filled 2012-07-21: qty 0.4

## 2012-07-21 MED ORDER — NEOSTIGMINE METHYLSULFATE 1 MG/ML IJ SOLN
INTRAMUSCULAR | Status: DC | PRN
  Administered 2012-07-21: 4 mg via INTRAVENOUS

## 2012-07-21 MED ORDER — ACETAMINOPHEN 10 MG/ML IV SOLN
INTRAVENOUS | Status: AC
  Filled 2012-07-21: qty 100

## 2012-07-21 MED ORDER — PROMETHAZINE HCL 25 MG/ML IJ SOLN: 6.2500 mg | INTRAMUSCULAR | Status: DC | PRN

## 2012-07-21 MED ORDER — ONDANSETRON HCL 4 MG PO TABS: 4.0000 mg | ORAL_TABLET | Freq: Four times a day (QID) | ORAL | Status: DC | PRN

## 2012-07-21 MED ORDER — ACETAMINOPHEN 10 MG/ML IV SOLN
INTRAVENOUS | Status: DC | PRN
  Administered 2012-07-21: 1000 mg via INTRAVENOUS

## 2012-07-21 MED ORDER — INSULIN ASPART 100 UNIT/ML ~~LOC~~ SOLN
0.0000 [IU] | Freq: Three times a day (TID) | SUBCUTANEOUS | Status: DC
  Administered 2012-07-21: 3 [IU] via SUBCUTANEOUS
  Administered 2012-07-22 (×2): 2 [IU] via SUBCUTANEOUS

## 2012-07-21 MED ORDER — LACTATED RINGERS IV SOLN
INTRAVENOUS | Status: DC | PRN
  Administered 2012-07-21: 12:00:00 via INTRAVENOUS

## 2012-07-21 MED ORDER — DIPHENHYDRAMINE HCL 50 MG/ML IJ SOLN: 12.5000 mg | Freq: Four times a day (QID) | INTRAMUSCULAR | Status: DC | PRN

## 2012-07-21 MED ORDER — SUCCINYLCHOLINE CHLORIDE 20 MG/ML IJ SOLN
INTRAMUSCULAR | Status: DC | PRN
  Administered 2012-07-21: 100 mg via INTRAVENOUS

## 2012-07-21 MED ORDER — KCL IN DEXTROSE-NACL 20-5-0.9 MEQ/L-%-% IV SOLN
INTRAVENOUS | Status: DC
  Administered 2012-07-21: 14:00:00 via INTRAVENOUS
  Filled 2012-07-21 (×3): qty 1000

## 2012-07-21 MED ORDER — GLYCOPYRROLATE 0.2 MG/ML IJ SOLN
INTRAMUSCULAR | Status: DC | PRN
  Administered 2012-07-21: 0.6 mg via INTRAVENOUS

## 2012-07-21 MED ORDER — ROCURONIUM BROMIDE 100 MG/10ML IV SOLN: INTRAVENOUS | Status: DC | PRN

## 2012-07-21 MED ORDER — PANTOPRAZOLE SODIUM 40 MG IV SOLR
40.0000 mg | Freq: Every day | INTRAVENOUS | Status: DC
  Filled 2012-07-21: qty 40

## 2012-07-21 MED ORDER — CEFAZOLIN SODIUM-DEXTROSE 2-3 GM-% IV SOLR
2.0000 g | Freq: Three times a day (TID) | INTRAVENOUS | Status: DC
  Administered 2012-07-21: 2 g via INTRAVENOUS
  Filled 2012-07-21 (×3): qty 50

## 2012-07-21 MED ORDER — HYDROMORPHONE HCL PF 1 MG/ML IJ SOLN
1.0000 mg | INTRAMUSCULAR | Status: DC | PRN
  Administered 2012-07-21: 1 mg via INTRAVENOUS
  Filled 2012-07-21: qty 1

## 2012-07-21 MED ORDER — DOCUSATE SODIUM 100 MG PO CAPS
100.0000 mg | ORAL_CAPSULE | Freq: Two times a day (BID) | ORAL | Status: DC
  Filled 2012-07-21: qty 1

## 2012-07-21 MED ORDER — HYDROMORPHONE HCL PF 1 MG/ML IJ SOLN
INTRAMUSCULAR | Status: AC
  Filled 2012-07-21: qty 2

## 2012-07-21 MED ORDER — HYDROMORPHONE HCL PF 1 MG/ML IJ SOLN
0.2500 mg | INTRAMUSCULAR | Status: DC | PRN
  Administered 2012-07-21 (×4): 0.5 mg via INTRAVENOUS

## 2012-07-21 MED ORDER — ROPINIROLE HCL 1 MG PO TABS
1.0000 mg | ORAL_TABLET | Freq: Every evening | ORAL | Status: DC | PRN
  Filled 2012-07-21: qty 1

## 2012-07-21 MED ORDER — ACETAMINOPHEN 325 MG PO TABS: 650.0000 mg | ORAL_TABLET | Freq: Four times a day (QID) | ORAL | Status: DC | PRN

## 2012-07-21 MED ORDER — BUPIVACAINE-EPINEPHRINE 0.5% -1:200000 IJ SOLN
INTRAMUSCULAR | Status: AC
  Filled 2012-07-21: qty 1

## 2012-07-21 MED ORDER — DIPHENHYDRAMINE HCL 12.5 MG/5ML PO ELIX: 12.5000 mg | ORAL_SOLUTION | Freq: Four times a day (QID) | ORAL | Status: DC | PRN

## 2012-07-21 MED ORDER — FENTANYL CITRATE 0.05 MG/ML IJ SOLN
INTRAMUSCULAR | Status: DC | PRN
  Administered 2012-07-21 (×2): 50 ug via INTRAVENOUS
  Administered 2012-07-21: 100 ug via INTRAVENOUS
  Administered 2012-07-21: 50 ug via INTRAVENOUS

## 2012-07-21 MED ORDER — ACETAMINOPHEN 650 MG RE SUPP: 650.0000 mg | Freq: Four times a day (QID) | RECTAL | Status: DC | PRN

## 2012-07-21 MED ORDER — BUPIVACAINE-EPINEPHRINE 0.5% -1:200000 IJ SOLN
INTRAMUSCULAR | Status: DC | PRN
  Administered 2012-07-21: 20 mL

## 2012-07-21 MED ORDER — DEXAMETHASONE SODIUM PHOSPHATE 10 MG/ML IJ SOLN
INTRAMUSCULAR | Status: DC | PRN
  Administered 2012-07-21: 10 mg via INTRAVENOUS

## 2012-07-21 MED ORDER — CEFAZOLIN SODIUM 1-5 GM-% IV SOLN
1.0000 g | Freq: Four times a day (QID) | INTRAVENOUS | Status: AC
  Administered 2012-07-21 – 2012-07-22 (×3): 1 g via INTRAVENOUS
  Filled 2012-07-21 (×5): qty 50

## 2012-07-21 MED ORDER — KCL IN DEXTROSE-NACL 20-5-0.9 MEQ/L-%-% IV SOLN
INTRAVENOUS | Status: AC
  Filled 2012-07-21: qty 1000

## 2012-07-21 MED ORDER — HYDROCODONE-ACETAMINOPHEN 5-325 MG PO TABS: 1.0000 | ORAL_TABLET | ORAL | Status: DC | PRN

## 2012-07-21 MED ORDER — KCL IN DEXTROSE-NACL 20-5-0.9 MEQ/L-%-% IV SOLN
INTRAVENOUS | Status: DC
  Administered 2012-07-21: 11:00:00 via INTRAVENOUS
  Filled 2012-07-21 (×2): qty 1000

## 2012-07-21 MED ORDER — ONDANSETRON HCL 4 MG/2ML IJ SOLN
INTRAMUSCULAR | Status: DC | PRN
  Administered 2012-07-21: 4 mg via INTRAVENOUS

## 2012-07-21 MED ORDER — METOPROLOL SUCCINATE ER 50 MG PO TB24
50.0000 mg | ORAL_TABLET | Freq: Every day | ORAL | Status: DC
  Administered 2012-07-22 – 2012-07-23 (×2): 50 mg via ORAL
  Filled 2012-07-21 (×2): qty 1

## 2012-07-21 SURGICAL SUPPLY — 40 items
APL SKNCLS STERI-STRIP NONHPOA (GAUZE/BANDAGES/DRESSINGS)
BENZOIN TINCTURE PRP APPL 2/3 (GAUZE/BANDAGES/DRESSINGS) ×1 IMPLANT
BLADE HEX COATED 2.75 (ELECTRODE) ×2 IMPLANT
BLADE SURG SZ10 CARB STEEL (BLADE) ×4 IMPLANT
CHLORAPREP W/TINT 26ML (MISCELLANEOUS) ×2 IMPLANT
CLOTH BEACON ORANGE TIMEOUT ST (SAFETY) ×2 IMPLANT
COVER SURGICAL LIGHT HANDLE (MISCELLANEOUS) ×1 IMPLANT
DECANTER SPIKE VIAL GLASS SM (MISCELLANEOUS) ×2 IMPLANT
DRAPE INCISE IOBAN 66X45 STRL (DRAPES) ×2 IMPLANT
DRAPE LAPAROTOMY T 102X78X121 (DRAPES) ×2 IMPLANT
DRAPE UTILITY W/TAPE 26X15 (DRAPES) ×1 IMPLANT
DRSG TEGADERM 4X4.75 (GAUZE/BANDAGES/DRESSINGS) ×2 IMPLANT
ELECT REM PT RETURN 9FT ADLT (ELECTROSURGICAL) ×2
ELECTRODE REM PT RTRN 9FT ADLT (ELECTROSURGICAL) ×1 IMPLANT
GLOVE BIOGEL PI IND STRL 7.0 (GLOVE) ×1 IMPLANT
GLOVE BIOGEL PI INDICATOR 7.0 (GLOVE) ×2
GLOVE ECLIPSE 8.0 STRL XLNG CF (GLOVE) ×2 IMPLANT
GLOVE INDICATOR 8.0 STRL GRN (GLOVE) ×4 IMPLANT
GOWN STRL NON-REIN LRG LVL3 (GOWN DISPOSABLE) ×2 IMPLANT
GOWN STRL REIN XL XLG (GOWN DISPOSABLE) ×4 IMPLANT
KIT BASIN OR (CUSTOM PROCEDURE TRAY) ×2 IMPLANT
LIGASURE LAP ATLAS 10MM 37CM (INSTRUMENTS) ×1 IMPLANT
NEEDLE HYPO 22GX1.5 SAFETY (NEEDLE) ×2 IMPLANT
NS IRRIG 1000ML POUR BTL (IV SOLUTION) ×2 IMPLANT
PACK BASIC VI WITH GOWN DISP (CUSTOM PROCEDURE TRAY) ×2 IMPLANT
PENCIL BUTTON HOLSTER BLD 10FT (ELECTRODE) ×2 IMPLANT
SOL PREP POV-IOD 16OZ 10% (MISCELLANEOUS) ×2 IMPLANT
SPONGE GAUZE 4X4 12PLY (GAUZE/BANDAGES/DRESSINGS) ×2 IMPLANT
SPONGE LAP 4X18 X RAY DECT (DISPOSABLE) ×2 IMPLANT
STAPLER VISISTAT 35W (STAPLE) ×1 IMPLANT
STRIP CLOSURE SKIN 1/2X4 (GAUZE/BANDAGES/DRESSINGS) ×1 IMPLANT
SUT MNCRL AB 4-0 PS2 18 (SUTURE) ×1 IMPLANT
SUT NOVA NAB DX-16 0-1 5-0 T12 (SUTURE) ×2 IMPLANT
SUT SURG 0 T 19/GS 22 1969 62 (SUTURE) ×1 IMPLANT
SUT VIC AB 3-0 SH 27 (SUTURE) ×2
SUT VIC AB 3-0 SH 27XBRD (SUTURE) ×1 IMPLANT
SYR BULB IRRIGATION 50ML (SYRINGE) ×2 IMPLANT
SYR CONTROL 10ML LL (SYRINGE) ×2 IMPLANT
TOWEL OR 17X26 10 PK STRL BLUE (TOWEL DISPOSABLE) ×2 IMPLANT
YANKAUER SUCT BULB TIP 10FT TU (MISCELLANEOUS) ×2 IMPLANT

## 2012-07-21 NOTE — Progress Notes (Addendum)
Subjective:    Patient ID: [name], female    DOB: 25-Aug-1946, 66 y.o.   MRN: [id number]  HPI  66 yo female here with 2 days of pain around her umbilical hernia.   She has had hernia for years but has never hurt her and has never been firm or red like it is today. Per pt, was told by surgery in past that it was small and she should just watch it.    No nausea, vomiting, or diarrhea.  No fevers.  Patient Active Problem List  Diagnosis  . Depression  . Hypertension  . GERD (gastroesophageal reflux disease)  . Hyperglycemia  . DJD (degenerative joint disease)  . Restless leg  . Peripheral neuropathy  . Routine general medical examination at a health care facility  . Vertigo  . Anxiety  . Chest pain, atypical  . Palpitations   Past Medical History  Diagnosis Date  . Depression   . Hypertension   . GERD (gastroesophageal reflux disease)   . Hyperglycemia   . DJD (degenerative joint disease)   . Restless leg   . Peripheral neuropathy   . Diabetes mellitus     borderline  . Syncope and collapse    Past Surgical History  Procedure Date  . Right knee replacement 2008  . Tubal ligation 1976  . Appendectomy 1976  . Tonsillectomy childhood  . Cardiac catheterization 2006    @ [facility]  . Multiple tooth extractions    History  Substance Use Topics  . Smoking status: Former Smoker -- 1.0 packs/day for 20 years    Types: Cigarettes  . Smokeless tobacco: Not on file  . Alcohol Use: 0.0 oz/week    1-2 Glasses of wine per week     Comment: ocassionally   Family History  Problem Relation Age of Onset  . Parkinsonism Mother   . Hypertension Father   . Heart attack Father    No Known Allergies Current Outpatient Prescriptions on File Prior to Visit  Medication Sig Dispense Refill  . aspirin 81 MG tablet Take 81 mg by mouth daily.        . metFORMIN (GLUCOPHAGE-XR) 500 MG 24 hr tablet Take 1 tablet (500 mg total) by mouth daily with breakfast.  90 tablet  3   . metoprolol succinate (TOPROL-XL) 50 MG 24 hr tablet TAKE 1 TABLET BY MOUTH ONCE DAILY  90 tablet  3  . omeprazole (PRILOSEC) 10 MG capsule Every other day.      . ramipril (ALTACE) 5 MG capsule TAKE ONE CAPSULE BY MOUTH EVERY DAY  90 capsule  3  . rOPINIRole (REQUIP) 1 MG tablet Take 1 mg by mouth as needed.       . triamterene-hydrochlorothiazide (MAXZIDE-25) 37.5-25 MG per tablet Take 1 each (1 tablet total) by mouth daily.  90 tablet  3   The PMH, PSH, Social History, Family History, Medications, and allergies have been reviewed in [facility], and have been updated if relevant.    Review of Systems See HPI     Objective:   Physical Exam BP 146/86  Pulse 84  Temp 98.1 F (36.7 C)  Wt 239 lb (108.41 kg) Gen:  Obese, no acute distress Abd:  + umbilical hernia, warm, TTP, non reducible, normal BS    Assessment & Plan:   1. Umbilical hernia  Deteriorated- now symptomatic and highly concerned for development of incarcinated hernia- stat abdominal CT, CMET and CBC.  Stat referral to surgery vs ER.  Will contact [facility].   Ambulatory referral to General Surgery, CT Abdomen Pelvis Wo Contrast, CBC with Differential, Comprehensive metabolic panel   Addendum- discussed with surgery- they advise pt to go straight to ER as pain is acutely worsening while she is in office and I am concerned for incarceration.  Pt aware and husband will drive her to [facility].

## 2012-07-21 NOTE — ED Notes (Signed)
Pt states went to PCP this morning d/t umbilical hernia pain since this past Friday, had blood work drawn and was sent here incase surgery was needed. States has been nauseous and diarrhea, denies vomiting.

## 2012-07-21 NOTE — Addendum Note (Signed)
Addended by: Dianne Dun on: 07/21/2012 10:17 AM   Modules accepted: Orders

## 2012-07-21 NOTE — Anesthesia Preprocedure Evaluation (Signed)
Anesthesia Evaluation  Patient identified by MRN, date of birth, ID band Patient awake    Reviewed: Allergy & Precautions, H&P , NPO status , Patient's Chart, lab work & pertinent test results  Airway Mallampati: II TM Distance: >3 FB Neck ROM: Full    Dental No notable dental hx. (+) Dental Advisory Given   Pulmonary neg pulmonary ROS,  breath sounds clear to auscultation  + decreased breath sounds      Cardiovascular hypertension, Pt. on medications Rhythm:Regular Rate:Normal     Neuro/Psych Anxiety negative neurological ROS     GI/Hepatic negative GI ROS, Neg liver ROS, GERD-  Medicated,  Endo/Other  diabetes, Oral Hypoglycemic AgentsMorbid obesity  Renal/GU negative Renal ROS  negative genitourinary   Musculoskeletal negative musculoskeletal ROS (+)   Abdominal   Peds negative pediatric ROS (+)  Hematology negative hematology ROS (+)   Anesthesia Other Findings   Reproductive/Obstetrics negative OB ROS                           Anesthesia Physical Anesthesia Plan  ASA: III  Anesthesia Plan: General   Post-op Pain Management:    Induction: Intravenous and Rapid sequence  Airway Management Planned: Oral ETT  Additional Equipment:   Intra-op Plan:   Post-operative Plan: Extubation in OR  Informed Consent: I have reviewed the patients History and Physical, chart, labs and discussed the procedure including the risks, benefits and alternatives for the proposed anesthesia with the patient or authorized representative who has indicated his/her understanding and acceptance.   Dental advisory given  Plan Discussed with: CRNA and Surgeon  Anesthesia Plan Comments:         Anesthesia Quick Evaluation

## 2012-07-21 NOTE — Preoperative (Addendum)
Beta Blockers   Reason not to administer Beta Blockers:Not Applicable 

## 2012-07-21 NOTE — Patient Instructions (Addendum)
Good to see you. After you go to the lab, please stop by to see Shirlee Limerick on your way out.  If your pain worsens, please go immediately to the ER.

## 2012-07-21 NOTE — ED Provider Notes (Signed)
MSE was initiated and I personally evaluated the patient at  11:05 AM on July 21, 2012.  The patient appears stable, though she c/o pain.  The remainder of the MSE  / Admission will be completed by another provider.  The patient was referred by her family medicine doctor, after a discussion with [facility].   Date: 07/21/2012  Rate: 80  Rhythm: normal sinus rhythm  QRS Axis: left  Intervals: normal  ST/T Wave abnormalities: normal  Conduction Disutrbances:nonspecific intraventricular conduction delay  Narrative Interpretation:   Old EKG Reviewed: changes noted Access, and IVCD are new compared to 2006. ABNORMAL ECG   Gerhard Munch, MD 07/21/12 1118

## 2012-07-21 NOTE — ED Notes (Signed)
MD at bedside.  Dr. Abbey Chatters

## 2012-07-21 NOTE — Transfer of Care (Signed)
Immediate Anesthesia Transfer of Care Note  Patient: [name]  Procedure(s) Performed: Procedure(s) (LRB) with comments: HERNIA REPAIR UMBILICAL ADULT (N/A) OMENTECTOMY (N/A)  Patient Location: PACU  Anesthesia Type:General  Level of Consciousness: awake, alert , oriented and patient cooperative  Airway & Oxygen Therapy: Patient Spontanous Breathing and Patient connected to face mask oxygen  Post-op Assessment: Report given to PACU RN and Post -op Vital signs reviewed and stable  Post vital signs: Reviewed and stable  Complications: No apparent anesthesia complications

## 2012-07-21 NOTE — H&P (Signed)
Patient seen and examined.  Plan emergency repair of incarcerated umbilical hernia.  I have discussed the procedure, risks, and aftercare.  Risks include but are not limited to bleeding, infection, wound problems, anesthesia, recurrence, injury to intestine, anastomotic leak if bowel resection done.  She seems to understand and agrees with the plan.

## 2012-07-21 NOTE — Anesthesia Postprocedure Evaluation (Signed)
  Anesthesia Post-op Note  Patient: [name]  Procedure(s) Performed: Procedure(s) (LRB): HERNIA REPAIR UMBILICAL ADULT (N/A) OMENTECTOMY (N/A)  Patient Location: PACU  Anesthesia Type: General  Level of Consciousness: awake and alert   Airway and Oxygen Therapy: Patient Spontanous Breathing  Post-op Pain: mild  Post-op Assessment: Post-op Vital signs reviewed, Patient's Cardiovascular Status Stable, Respiratory Function Stable, Patent Airway and No signs of Nausea or vomiting  Last Vitals:  Filed Vitals:   07/21/12 1420  BP:   Pulse: 69  Temp:   Resp: 11    Post-op Vital Signs: stable   Complications: No apparent anesthesia complications

## 2012-07-21 NOTE — ED Notes (Signed)
Noted pt's wife shaving pt while in the stretcher.

## 2012-07-21 NOTE — Op Note (Signed)
Operative Note  [name] female 66 y.o. 07/21/2012  PREOPERATIVE DX:  Incarcerated umbilical hernia  POSTOPERATIVE DX:  Incarcerated umbilical and ventral hernias (containing omentum)  PROCEDURE:  Emergency repair of incarcerated umbilical and ventral hernias with partial omentectomy         Surgeon: Adolph Pollack   Assistants: None  Anesthesia: General endotracheal anesthesia  Indications: A this is a 66 year old female with a long-standing umbilical hernia. Over the weekend, the hernia became larger and more painful and she had redness over her skin. She presented to her primary care physician who referred her to see [place] in the emergency room. She has an incarcerated umbilical hernia on exam with some overlying cellulitis. She is now brought to the operating room for emergency repair.    Procedure Detail:  She was brought to the operating room placed supine on the operating table and general anesthetic was administered. The abdominal wall was sterilely prepped and draped. A periumbilical incision was made dividing the skin sharply. Immediately in the subcutaneous tissue a hernia sac was encountered. This was dissected free from the subcutaneous tissue. A small fascial defect was noted just adjacent to the umbilicus. I entered the hernia sac and saw some compromised omentum. Using the LigaSure I removed part of the omentum. I then debrided some hernia sac free from the subcutaneous tissue and fascia. Superior to this was another hernia. There was a fascial bridge separating the two hernias. I dissected the umbilicus free from the abdominal wall. I then connected the two hernias by dividing of fascial bridge. I opened the second hernia sac up and removed part of the omentum out of it which was incarcerated. I then debrided part of the sac as well.  I dissected subcutaneous tissue free from the fascia surrounding the hernia defect. I then primarily closed the hernia defect with  interrupted #1 Novafil sutures. Because of the overlying cellulitis of the skin I decided not to use mesh. I anesthetized the subcutaneous tissue and the fascia with Marcaine solution. The wound was then irrigated and hemostasis was adequate.  The umbilicus was reimplanted on the fascia with 3-0 Vicryl suture. The subcutaneous tissue was approximated with a running 3-0 Vicryl suture. The skin was closed using staples. A sterile dressing was applied.  She tolerated the procedure well without any apparent complications and was taken to the recovery room in satisfactory condition.  Estimated Blood Loss:  less than 100 mL         Drains: none  Blood Given: none          Specimens: Part of omentum and hernia sac        Complications:  * No complications entered in OR log *         Disposition: PACU - hemodynamically stable.         Condition: stable

## 2012-07-21 NOTE — H&P (Addendum)
[name] is an 66 y.o. female.   Chief Complaint: abdominal pain HPI: 66 yo female here with 2 days of pain around her umbilical hernia, patient was initially seen by her PCP Dr. Ruthe [name] who after seeing the patient and drawing labs contacted our office and we then referred the patient to [name] - [name] for emergent umbilical hernia repair.  She has had a umbilical hernia for years but it has never hurt her and has never been firm or red like it is today. She is also c/o back associated with this complaint. Per pt, she had been told by surgery in past that it was small and she should just watch it.  Positive for nausea,(secondary to medications taken on an empty stomach this am per patient) denies any vomiting, or diarrhea, fevers. Past surgical hx is positive for tubal ligation, ? Appendectomy and right knee replacement.  We will admit the patient, for emergent umbilical hernia repair and possible bowel resection. She has been made NPO, abdominal films, Cxr, Ekg ,labs have been obtained. She will receive Ancef 2 g pre-op, pain meds  and IVF.  Dr. Abbey Chatters has seen the patient and discussed the risks associated with surgery which include, but are not limited to infection, bleeding or damage to other structures. The patient has verbalized understanding of these risk factors and has elected to go forward with surgery.   Past Medical History  Diagnosis Date  . Depression   . Hypertension   . GERD (gastroesophageal reflux disease)   . Hyperglycemia   . DJD (degenerative joint disease)   . Restless leg   . Peripheral neuropathy   . Diabetes mellitus     borderline  . Syncope and collapse     Past Surgical History  Procedure Date  . Right knee replacement 2008  . Tubal ligation 1976  . Appendectomy 1976  . Tonsillectomy childhood  . Cardiac catheterization 2006    @ [facility]  . Multiple tooth extractions     Family History  Problem Relation Age of Onset  . Parkinsonism Mother   .  Hypertension Father   . Heart attack Father    Social History:  reports that she has quit smoking. Her smoking use included Cigarettes. She has a 20 pack-year smoking history. She does not have any smokeless tobacco history on file. She reports that she drinks alcohol. She reports that she does not use illicit drugs.  Allergies: No Known Allergies   (Not in a [name] admission)  Results for orders placed in visit on 07/21/12 (from the past 48 hour(s))  CBC WITH DIFFERENTIAL     Status: Abnormal   Collection Time   07/21/12  9:29 AM      Component Value Range Comment   WBC 11.4 (*) 4.5 - 10.5 K/uL    RBC 4.96  3.87 - 5.11 Mil/uL    Hemoglobin 14.4  12.0 - 15.0 g/dL    HCT 16.1  09.6 - 04.5 %    MCV 87.3  78.0 - 100.0 fl    MCHC 33.4  30.0 - 36.0 g/dL    RDW 40.9  81.1 - 91.4 %    Platelets 280.0  150.0 - 400.0 K/uL    Neutrophils Relative 59.0  43.0 - 77.0 %    Lymphocytes Relative 28.8  12.0 - 46.0 %    Monocytes Relative 8.8  3.0 - 12.0 %    Eosinophils Relative 2.7  0.0 - 5.0 %    Basophils Relative  0.7  0.0 - 3.0 %    Neutro Abs 6.7  1.4 - 7.7 K/uL    Lymphs Abs 3.3  0.7 - 4.0 K/uL    Monocytes Absolute 1.0  0.1 - 1.0 K/uL    Eosinophils Absolute 0.3  0.0 - 0.7 K/uL    Basophils Absolute 0.1  0.0 - 0.1 K/uL   COMPREHENSIVE METABOLIC PANEL     Status: Abnormal   Collection Time   07/21/12  9:29 AM      Component Value Range Comment   Sodium 138  135 - 145 mEq/L    Potassium 3.8  3.5 - 5.1 mEq/L    Chloride 104  96 - 112 mEq/L    CO2 28  19 - 32 mEq/L    Glucose, Bld 128 (*) 70 - 99 mg/dL    BUN 16  6 - 23 mg/dL    Creatinine, Ser 0.9  0.4 - 1.2 mg/dL    Total Bilirubin 0.6  0.3 - 1.2 mg/dL    Alkaline Phosphatase 95  39 - 117 U/L    AST 23  0 - 37 U/L    ALT 37 (*) 0 - 35 U/L    Total Protein 7.3  6.0 - 8.3 g/dL    Albumin 4.1  3.5 - 5.2 g/dL    Calcium 9.8  8.4 - 16.1 mg/dL    GFR 09.60  >45.40 mL/min    No results found.  Review of Systems  Constitutional:  Positive for malaise/fatigue. Negative for fever, chills, weight loss and diaphoresis.  HENT: Negative.  Negative for neck pain.   Eyes: Negative.   Respiratory: Negative.   Cardiovascular: Positive for palpitations and leg swelling. Negative for chest pain, orthopnea, claudication and PND.  Gastrointestinal: Positive for nausea and abdominal pain. Negative for heartburn, vomiting, diarrhea, constipation, blood in stool and melena.  Genitourinary: Negative.   Musculoskeletal: Positive for back pain. Negative for myalgias, joint pain and falls.       Recent onset accompanying umbilical/abdominal pain.  Skin: Negative for itching and rash.       Positive erythema around umbilicus.  Neurological: Positive for dizziness and weakness. Negative for tingling, tremors, sensory change, speech change, focal weakness, seizures and loss of consciousness.       Occassionally when sitting up from lying position. Not associated with any chest pain or palpation.  Endo/Heme/Allergies: Negative.   Psychiatric/Behavioral: Negative.     Blood pressure 145/71, pulse 77, temperature 98.6 F (37 C), temperature source Oral, resp. rate 18, SpO2 94.00%. Physical Exam  Constitutional: She is oriented to person, place, and time. She appears well-developed and well-nourished. No distress.  HENT:  Head: Normocephalic and atraumatic.  Nose: Nose normal.  Mouth/Throat: No oropharyngeal exudate.  Eyes: Conjunctivae normal and EOM are normal. Pupils are equal, round, and reactive to light. Right eye exhibits no discharge. Left eye exhibits no discharge. No scleral icterus.  Neck: Normal range of motion. Neck supple. No JVD present. No tracheal deviation present. No thyromegaly present.  Respiratory: No stridor.  GI: Soft. Bowel sounds are normal. She exhibits no distension and no mass. There is tenderness. There is no rebound and no guarding.       Tender over umbilicus  Musculoskeletal: Normal range of motion. She  exhibits no edema and no tenderness.       Has hx of peripheral edema takes"fluid pill"  Lymphadenopathy:    She has no cervical adenopathy.  Neurological: She is alert and oriented to  person, place, and time.  Skin: Skin is warm and dry. No rash noted. She is not diaphoretic. There is erythema. No pallor.       Erythema around umbilicus  Psychiatric: She has a normal mood and affect.     Assessment/Plan Patient Active Problem List  Diagnosis  . Depression  . Hypertension  . GERD (gastroesophageal reflux disease)  . Hyperglycemia  . DJD (degenerative joint disease)  . Restless leg  . Peripheral neuropathy  . Routine general medical examination at a health care facility  . Vertigo  . Anxiety  . Chest pain, atypical  . Palpitations  Incarcerated umbilical hernia  Plan: 1. Admit to surgical service 2. Emergent umbilical hernia repair, possible bowel resection 3. NPO, IVF, ABX (pre-op 2 g Ancef) 4. Pain management, DVT,GERD prophylaxis. 5. Follow clinical picture post-op.   Blenda Mounts [facility] Pager # [phone]  07/21/12 11:49 am

## 2012-07-22 ENCOUNTER — Encounter (HOSPITAL_COMMUNITY): Payer: Self-pay | Admitting: General Surgery

## 2012-07-22 LAB — GLUCOSE, CAPILLARY
Glucose-Capillary: 121 mg/dL — ABNORMAL HIGH (ref 70–99)
Glucose-Capillary: 140 mg/dL — ABNORMAL HIGH (ref 70–99)
Glucose-Capillary: 149 mg/dL — ABNORMAL HIGH (ref 70–99)

## 2012-07-22 NOTE — Progress Notes (Signed)
Patient seen and examined.  Agree with PA's note.  

## 2012-07-22 NOTE — Care Management Note (Signed)
    Page 1 of 1   07/22/2012     11:27:11 AM   CARE MANAGEMENT NOTE 07/22/2012  Patient:  [name], [name]   Account Number:  [id number]  Date Initiated:  07/22/2012  Documentation initiated by:  Lorenda Ishihara  Subjective/Objective Assessment:   66 yo female admitted s/p umbilical hernia repair, omenectomy. PTA lived at home with spouse.     Action/Plan:   Home when stable   Anticipated DC Date:  07/23/2012   Anticipated DC Plan:  HOME/SELF CARE      DC Planning Services  CM consult      Choice offered to / List presented to:             Status of service:  Completed, signed off Medicare Important Message given?   (If response is "NO", the following Medicare IM given date fields will be blank) Date Medicare IM given:   Date Additional Medicare IM given:    Discharge Disposition:  HOME/SELF CARE  Per UR Regulation:  Reviewed for med. necessity/level of care/duration of stay  If discussed at Long Length of Stay Meetings, dates discussed:    Comments:

## 2012-07-22 NOTE — Progress Notes (Signed)
1 Day Post-Op  Subjective: Doing well, tolerating diet, No BM yet. + BS  Objective: Vital signs in last 24 hours: Temp:  [97 F (36.1 C)-98.6 F (37 C)] 97.7 F (36.5 C) (11/26 0548) Pulse Rate:  [52-100] 80  (11/26 0952) Resp:  [11-20] 20  (11/26 0548) BP: (105-154)/(55-84) 119/55 mmHg (11/26 0952) SpO2:  [94 %-100 %] 100 % (11/26 0548) Weight:  [239 lb (108.41 kg)] 239 lb (108.41 kg) (11/25 1500) Last BM Date: 07/20/12  Intake/Output from previous day: 11/25 0701 - 11/26 0700 In: 3666.7 [P.O.:1080; I.V.:2586.7] Out: 1350 [Urine:1250; Blood:100] Intake/Output this shift: Total I/O In: -  Out: 300 [Urine:300]  General appearance: alert, cooperative, appears stated age, no distress and moderately obese Chest: CTA Cardiac: RRR Abdomen: Dressing in place, staples intact , no erythema, scant amount of old drainage on dressing. Extremities: Warm to touch, + pulses, no edema or tenderness.  Lab Results:   [facility] 07/21/12 1538 07/21/12 0929  WBC 9.8 11.4*  HGB 12.8 14.4  HCT 39.1 43.3  PLT 238 280.0   BMET  Basename 07/21/12 1538 07/21/12 0929  NA -- 138  K -- 3.8  CL -- 104  CO2 -- 28  GLUCOSE -- 128*  BUN -- 16  CREATININE 0.84 0.9  CALCIUM -- 9.8   PT/INR  Basename 07/21/12 1111  LABPROT 12.5  INR 0.94   ABG No results found for this basename: PHART:2,PCO2:2,PO2:2,HCO3:2 in the last 72 hours  Studies/Results: Dg Abd Portable 2v  07/21/2012  *RADIOLOGY REPORT*  Clinical Data: Possible incarcerated hernia.  PORTABLE ABDOMEN - 2 VIEW  Comparison: None.  Findings: Difficult to evaluate for free air on the decubitus image.  There is a nonspecific bowel gas pattern.  Prominent density in the region of the right transverse colon could represent overlying stool but difficult to exclude renal stones.  IMPRESSION: Nonspecific bowel gas pattern.  Indeterminate densities or calcification in the right abdomen. Findings could be related to stool but cannot exclude  renal stones. These findings could be further evaluated with CT.   Original Report Authenticated By: Richarda Overlie, M.D.     Anti-infectives: Anti-infectives     Start     Dose/Rate Route Frequency Ordered Stop   07/21/12 2200   ceFAZolin (ANCEF) IVPB 1 g/50 mL premix        1 g 100 mL/hr over 30 Minutes Intravenous Every 6 hours 07/21/12 1512 07/22/12 1023   07/21/12 1230   ceFAZolin (ANCEF) IVPB 2 g/50 mL premix  Status:  Discontinued        2 g 100 mL/hr over 30 Minutes Intravenous 3 times per day 07/21/12 1134 07/21/12 1512          Assessment/Plan:  Patient Active Problem List  Diagnosis  . Depression  . Hypertension  . GERD (gastroesophageal reflux disease)  . Hyperglycemia  . DJD (degenerative joint disease)  . Restless leg  . Peripheral neuropathy  . Routine general medical examination at a health care facility  . Vertigo  . Anxiety  . Chest pain, atypical  . Palpitations   s/p Procedure(s) (LRB) with comments: HERNIA REPAIR UMBILICAL ADULT (N/A) OMENTECTOMY (N/A)  Plan: 1. Continue to advance diet as tolerated 2. Ambulate/OOB 3. Abdominal binder for #2 4. Encourage IS 5. Probable discharge in next 24 hrs.   LOS: 1 day    Blenda Mounts [facility] Pager # [phone]  07/22/2012

## 2012-07-22 NOTE — H&P (Signed)
Patient seen and examined.  Agree with PA's note.  

## 2012-07-23 MED ORDER — POLYETHYLENE GLYCOL 3350 17 G PO PACK
17.0000 g | PACK | Freq: Every day | ORAL | Status: DC
  Administered 2012-07-23: 17 g via ORAL
  Filled 2012-07-23: qty 1

## 2012-07-23 MED ORDER — DOCUSATE SODIUM 100 MG PO CAPS
200.0000 mg | ORAL_CAPSULE | Freq: Two times a day (BID) | ORAL | Status: DC | PRN
  Administered 2012-07-23: 200 mg via ORAL
  Filled 2012-07-23: qty 2

## 2012-07-23 MED ORDER — POLYETHYLENE GLYCOL 3350 17 G PO PACK: 17.0000 g | PACK | Freq: Every day | ORAL | Status: DC

## 2012-07-23 MED ORDER — OXYCODONE-ACETAMINOPHEN 5-325 MG PO TABS: 1.0000 | ORAL_TABLET | Freq: Four times a day (QID) | ORAL | Status: DC | PRN

## 2012-07-23 MED ORDER — DSS 100 MG PO CAPS: 200.0000 mg | ORAL_CAPSULE | Freq: Two times a day (BID) | ORAL | Status: DC | PRN

## 2012-07-23 NOTE — Discharge Summary (Signed)
  Physician Discharge Summary  Patient ID: [name] MRN: [id number] DOB/AGE: Mar 31, 1946 66 y.o.  Admit date: 07/21/2012 Discharge date: 07/23/2012  Admitting Diagnosis: Abdominal pain Incarcerated Umbilical Hernia  Discharge Diagnosis Patient Active Problem List   Diagnosis Date Noted  . Palpitations 04/04/2012  . Chest pain, atypical 04/02/2012  . Vertigo 04/19/2011  . Anxiety 04/19/2011  . Routine general medical examination at a health care facility 03/08/2011  . Depression   . Hypertension   . GERD (gastroesophageal reflux disease)   . Hyperglycemia   . DJD (degenerative joint disease)   . Restless leg   . Peripheral neuropathy   Incarcerated umbilical hernia  Consultants None  Procedures 11/25 Emergency repair of incarcerated umbilical and ventral hernias with partial omentectomy   Hospital Course:  66 yr old female who was sent from her primary care MD due to red, painful umbilical hernia.  She has had the hernia for a while but developed severe pain and redness around it over the weekend.  This was associated with constipation as well.  Evaluation at [facility] revealed an incarcerated umbilical hernia.  She was admited and underwent the procedure above.  She tolerated this well.  Staples were in place.  Over the next 2 days, her diet was advanced as her bowel function improved.  At time of discharge, she was tolerating a regular diet well, ambulating well, pain controlled, vital signs stable, using an abdominal binder when out of bed and felt stable for discharge.  She will follow up next week for staple removal.       Medication List     As of 07/23/2012  8:06 AM    TAKE these medications         aspirin 81 MG tablet   Take 81 mg by mouth daily.      metFORMIN 500 MG 24 hr tablet   Commonly known as: GLUCOPHAGE-XR   Take 1 tablet (500 mg total) by mouth daily with breakfast.      metoprolol succinate 50 MG 24 hr tablet   Commonly known as: TOPROL-XL   Take 50 mg by mouth daily. Take with or immediately following a meal.      omeprazole 10 MG capsule   Commonly known as: PRILOSEC   Every other day.      oxyCODONE-acetaminophen 5-325 MG per tablet   Commonly known as: PERCOCET/ROXICET   Take 1-2 tablets by mouth every 6 (six) hours as needed.      ramipril 5 MG capsule   Commonly known as: ALTACE   Take 5 mg by mouth daily.      triamterene-hydrochlorothiazide 37.5-25 MG per tablet   Commonly known as: MAXZIDE-25   Take 1 each (1 tablet total) by mouth daily.             Follow-up Information    Follow up with [facility] On 07/29/2012.   Contact information:   [address] [place] [address] [phone]         Signed: Denny Levy [facility] [phone]  07/23/2012, 8:06 AM

## 2012-07-23 NOTE — Discharge Summary (Signed)
Patient seen and examined.  Agree with PA's note. Discharge instructions were given to her.

## 2012-07-28 ENCOUNTER — Encounter (INDEPENDENT_AMBULATORY_CARE_PROVIDER_SITE_OTHER): Payer: Medicare Other

## 2012-07-30 ENCOUNTER — Ambulatory Visit (INDEPENDENT_AMBULATORY_CARE_PROVIDER_SITE_OTHER): Payer: Medicare Other

## 2012-07-30 DIAGNOSIS — K439 Ventral hernia without obstruction or gangrene: Secondary | ICD-10-CM

## 2012-07-30 NOTE — Progress Notes (Signed)
Patient is here today for staple removal post incarcerated ventral hernia repair.  Dr. Abbey Chatters saw the patient and went over restrictions.  Staples were removed and steri strips placed on the incision.  The patient is healing well and had no complaints.  She will return to see Dr. Abbey Chatters on 09/03/12.

## 2012-08-12 ENCOUNTER — Encounter (INDEPENDENT_AMBULATORY_CARE_PROVIDER_SITE_OTHER): Payer: Medicare Other | Admitting: General Surgery

## 2012-09-03 ENCOUNTER — Encounter (INDEPENDENT_AMBULATORY_CARE_PROVIDER_SITE_OTHER): Payer: Self-pay | Admitting: General Surgery

## 2012-09-03 ENCOUNTER — Ambulatory Visit (INDEPENDENT_AMBULATORY_CARE_PROVIDER_SITE_OTHER): Payer: Medicare Other | Admitting: General Surgery

## 2012-09-03 VITALS — BP 118/70 | HR 72 | Temp 97.6°F | Resp 18 | Ht 63.5 in | Wt 243.5 lb

## 2012-09-03 DIAGNOSIS — Z9889 Other specified postprocedural states: Secondary | ICD-10-CM

## 2012-09-03 NOTE — Patient Instructions (Signed)
Resume normal activities as tolerated as we discussed. 

## 2012-09-03 NOTE — Progress Notes (Signed)
Procedure:  Emergency repair of incarcerated umbilical and ventral hernias with partial omentectomy  Date:  07/21/2012  Pathology: na  History:  She is here for a postoperative visit. She is doing well. She is eating well. Her bowels are moving.  Exam: General- Is in NAD. Abdomen-soft, incision is clean and intact, repair is solid.  Assessment:  Doing well postoperatively.  Plan:  Resume normal activities as tolerated. We had a discussion about what this meant. Return visit as needed.

## 2012-09-13 ENCOUNTER — Other Ambulatory Visit: Payer: Self-pay | Admitting: Family Medicine

## 2013-01-17 ENCOUNTER — Inpatient Hospital Stay (HOSPITAL_COMMUNITY)
Admission: EM | Admit: 2013-01-17 | Discharge: 2013-01-20 | DRG: 563 | Disposition: A | Discharge status: Skilled Nursing Facility | Payer: Medicare Other | Attending: Internal Medicine | Admitting: Internal Medicine

## 2013-01-17 ENCOUNTER — Emergency Department (HOSPITAL_COMMUNITY): Payer: Medicare Other

## 2013-01-17 DIAGNOSIS — Z79899 Other long term (current) drug therapy: Secondary | ICD-10-CM

## 2013-01-17 DIAGNOSIS — M1990 Unspecified osteoarthritis, unspecified site: Secondary | ICD-10-CM | POA: Diagnosis present

## 2013-01-17 DIAGNOSIS — X58XXXA Exposure to other specified factors, initial encounter: Secondary | ICD-10-CM

## 2013-01-17 DIAGNOSIS — Z96659 Presence of unspecified artificial knee joint: Secondary | ICD-10-CM

## 2013-01-17 DIAGNOSIS — Z6841 Body Mass Index (BMI) 40.0 and over, adult: Secondary | ICD-10-CM

## 2013-01-17 DIAGNOSIS — I10 Essential (primary) hypertension: Secondary | ICD-10-CM | POA: Diagnosis present

## 2013-01-17 DIAGNOSIS — E876 Hypokalemia: Secondary | ICD-10-CM | POA: Diagnosis present

## 2013-01-17 DIAGNOSIS — E1142 Type 2 diabetes mellitus with diabetic polyneuropathy: Secondary | ICD-10-CM | POA: Diagnosis present

## 2013-01-17 DIAGNOSIS — Z8639 Personal history of other endocrine, nutritional and metabolic disease: Secondary | ICD-10-CM | POA: Diagnosis present

## 2013-01-17 DIAGNOSIS — Z7982 Long term (current) use of aspirin: Secondary | ICD-10-CM

## 2013-01-17 DIAGNOSIS — G2581 Restless legs syndrome: Secondary | ICD-10-CM | POA: Diagnosis present

## 2013-01-17 DIAGNOSIS — IMO0002 Reserved for concepts with insufficient information to code with codable children: Principal | ICD-10-CM | POA: Diagnosis present

## 2013-01-17 DIAGNOSIS — R0789 Other chest pain: Secondary | ICD-10-CM

## 2013-01-17 DIAGNOSIS — E1149 Type 2 diabetes mellitus with other diabetic neurological complication: Secondary | ICD-10-CM | POA: Diagnosis present

## 2013-01-17 DIAGNOSIS — E119 Type 2 diabetes mellitus without complications: Secondary | ICD-10-CM | POA: Diagnosis present

## 2013-01-17 DIAGNOSIS — K219 Gastro-esophageal reflux disease without esophagitis: Secondary | ICD-10-CM | POA: Diagnosis present

## 2013-01-17 DIAGNOSIS — M25562 Pain in left knee: Secondary | ICD-10-CM

## 2013-01-17 DIAGNOSIS — D72829 Elevated white blood cell count, unspecified: Secondary | ICD-10-CM | POA: Diagnosis present

## 2013-01-17 DIAGNOSIS — Z87891 Personal history of nicotine dependence: Secondary | ICD-10-CM

## 2013-01-17 DIAGNOSIS — S83207A Unspecified tear of unspecified meniscus, current injury, left knee, initial encounter: Secondary | ICD-10-CM | POA: Diagnosis present

## 2013-01-17 HISTORY — DX: Unspecified tear of unspecified meniscus, current injury, left knee, initial encounter: S83.207A

## 2013-01-17 HISTORY — DX: Encounter for other specified aftercare: Z51.89

## 2013-01-17 MED ORDER — OXYCODONE-ACETAMINOPHEN 5-325 MG PO TABS
1.0000 | ORAL_TABLET | Freq: Once | ORAL | Status: AC
  Administered 2013-01-17: 1 via ORAL
  Filled 2013-01-17: qty 1

## 2013-01-17 MED ORDER — OXYCODONE-ACETAMINOPHEN 5-325 MG PO TABS: 1.0000 | ORAL_TABLET | Freq: Four times a day (QID) | ORAL | Status: DC | PRN

## 2013-01-17 MED ORDER — MORPHINE SULFATE 4 MG/ML IJ SOLN
4.0000 mg | Freq: Once | INTRAMUSCULAR | Status: DC
  Filled 2013-01-17: qty 1

## 2013-01-17 NOTE — ED Notes (Signed)
Pt BIB EMS. Pt was walking up stairs and felt her L knee get weak and give out. Pt lowered herself to the ground. Pt has been unable to bear wt on knee since injury. Pt has some swelling to L knee, but states she normally has swelling to knee. Pt has a hx of R knee replacement. Pt a/o x 4. No acute distress.

## 2013-01-17 NOTE — ED Notes (Signed)
Pt assisted to BR in gurney. 2 person assist to get pt off gurney to toilet. Pt concerned she is still not able to bear wt on L leg. Pt upset and states "I can't do this at home. My husband can't help me." PA Lawyer aware of pt's concerns.

## 2013-01-17 NOTE — ED Notes (Signed)
MD at bedside. Dr. Miller at bedside.  

## 2013-01-17 NOTE — ED Notes (Signed)
[id number]<JY> Expected date:<BR> Expected time:<BR> Means of arrival:<BR> Comments:<BR> EMS/66 yo female with chronic left knee arthritis-states knee gave out this evening-c/o pain and swelling-no fall

## 2013-01-17 NOTE — ED Provider Notes (Signed)
Medical screening examination/treatment/procedure(s) were performed by non-physician practitioner and as supervising physician I was immediately available for consultation/collaboration.    Celene Kras, MD 01/17/13 [phone]

## 2013-01-17 NOTE — ED Notes (Signed)
Ortho tech called to place knee immobilizer 

## 2013-01-17 NOTE — ED Provider Notes (Signed)
History    This chart was scribed for Ebbie Ridge PA-C, a non-physician practitioner working with Celene Kras, MD by Lewanda Rife, ED Scribe. This patient was seen in room WTR6/WTR6 and the patient's care was started at 2132.    CSN: [id number]  Arrival date & time 01/17/13  2118   First MD Initiated Contact with Patient 01/17/13 2125      Chief Complaint  Patient presents with  . Knee Injury    (Consider location/radiation/quality/duration/timing/severity/associated sxs/prior treatment) The history is provided by the patient.   HPI Comments: [name] is a 67 y.o. female brought in by ambulance, who presents to the Emergency Department complaining of constant worsening non-radiating left knee pain onset 3 weeks. Denies recent injury, and fever. Reports pain is aggravated with weight bearing and alleviated by nothing. Denies trying any OTC medications PTA to relieve symptoms. Reports swelling in left knee is chronic. Reports hx of right knee replacement. Reports she will be seeing for orthopedic evaluation of her knee pain Geoffrey in next week.   Past Medical History  Diagnosis Date  . Depression   . Hypertension   . GERD (gastroesophageal reflux disease)   . Hyperglycemia   . DJD (degenerative joint disease)   . Restless leg   . Peripheral neuropathy   . Diabetes mellitus     borderline  . Syncope and collapse     Past Surgical History  Procedure Laterality Date  . Right knee replacement  2008  . Tubal ligation  1976  . Appendectomy  1976  . Tonsillectomy  childhood  . Cardiac catheterization  2006    @ [facility]  . Multiple tooth extractions    . Joint replacement  06/2005    right total knee  . Umbilical hernia repair  07/21/2012    Procedure: HERNIA REPAIR UMBILICAL ADULT;  Surgeon: Adolph Pollack, MD;  Location: [facility] ORS;  Service: General;  Laterality: N/A;  . Omentectomy  07/21/2012    Procedure: OMENTECTOMY;  Surgeon: Adolph Pollack, MD;   Location: [facility] ORS;  Service: General;  Laterality: N/A;    Family History  Problem Relation Age of Onset  . Parkinsonism Mother   . Hypertension Father   . Heart attack Father     History  Substance Use Topics  . Smoking status: Former Smoker -- 1.00 packs/day for 20 years    Types: Cigarettes  . Smokeless tobacco: Never Used  . Alcohol Use: 0.0 oz/week    1-2 Glasses of wine per week     Comment: ocassionally    OB History   Grav Para Term Preterm Abortions TAB SAB Ect Mult Living                  Review of Systems  Musculoskeletal: Positive for arthralgias.  All other systems reviewed and are negative.   A complete 10 system review of systems was obtained and all systems are negative except as noted in the HPI and PMH.    Allergies  Review of patient's allergies indicates no known allergies.  Home Medications   Current Outpatient Rx  Name  Route  Sig  Dispense  Refill  . aspirin 81 MG tablet   Oral   Take 81 mg by mouth daily.           . metFORMIN (GLUCOPHAGE-XR) 500 MG 24 hr tablet   Oral   Take 1 tablet (500 mg total) by mouth daily with breakfast.   90 tablet  3   . metoprolol succinate (TOPROL-XL) 50 MG 24 hr tablet   Oral   Take 50 mg by mouth daily. Take with or immediately following a meal.         . omeprazole (PRILOSEC) 10 MG capsule   Oral   Take 10 mg by mouth every other day. Every other day.         . ramipril (ALTACE) 5 MG capsule   Oral   Take 5 mg by mouth daily.         Marland Kitchen triamterene-hydrochlorothiazide (MAXZIDE-25) 37.5-25 MG per tablet   Oral   Take 1 tablet by mouth daily.           BP 151/67  Pulse 65  Temp(Src) 99.2 F (37.3 C) (Oral)  Resp 18  SpO2 99%  Physical Exam  Nursing note and vitals reviewed. Constitutional: She is oriented to person, place, and time. She appears well-developed and well-nourished. No distress.  Morbidly obese  HENT:  Head: Normocephalic and atraumatic.  Eyes: EOM are normal.   Neck: Neck supple. No tracheal deviation present.  Cardiovascular: Normal rate.   Pulmonary/Chest: Effort normal. No respiratory distress.  Musculoskeletal: Normal range of motion.       Left knee: Tenderness found. No patellar tendon tenderness noted.  Anterolateral tenderness to touch and active ROM    Neurological: She is alert and oriented to person, place, and time.  Skin: Skin is warm and dry.  Psychiatric: She has a normal mood and affect. Her behavior is normal.    ED Course  Procedures (including critical care time) Medications - No data to display The patient is advised of her findings. The patient will be referred to her ortho. Told to return here as needed. The patient advised to use walker. Told to ice and elevate the knee.    MDM  I personally performed the services described in this documentation, which was scribed in my presence. The recorded information has been reviewed and is accurate.    Carlyle Dolly, PA-C 01/17/13 2248

## 2013-01-18 ENCOUNTER — Emergency Department (HOSPITAL_COMMUNITY): Payer: Medicare Other

## 2013-01-18 ENCOUNTER — Inpatient Hospital Stay (HOSPITAL_COMMUNITY): Payer: Medicare Other

## 2013-01-18 ENCOUNTER — Encounter (HOSPITAL_COMMUNITY): Payer: Self-pay | Admitting: Oncology

## 2013-01-18 DIAGNOSIS — M25562 Pain in left knee: Secondary | ICD-10-CM | POA: Diagnosis present

## 2013-01-18 DIAGNOSIS — M25569 Pain in unspecified knee: Secondary | ICD-10-CM

## 2013-01-18 DIAGNOSIS — R0789 Other chest pain: Secondary | ICD-10-CM

## 2013-01-18 DIAGNOSIS — D72829 Elevated white blood cell count, unspecified: Secondary | ICD-10-CM | POA: Diagnosis present

## 2013-01-18 DIAGNOSIS — Z8639 Personal history of other endocrine, nutritional and metabolic disease: Secondary | ICD-10-CM | POA: Diagnosis present

## 2013-01-18 DIAGNOSIS — E1149 Type 2 diabetes mellitus with other diabetic neurological complication: Secondary | ICD-10-CM | POA: Diagnosis present

## 2013-01-18 LAB — CBC
HCT: 42 % (ref 36.0–46.0)
Hemoglobin: 12.7 g/dL (ref 12.0–15.0)
MCH: 29.1 pg (ref 26.0–34.0)
MCHC: 33.3 g/dL (ref 30.0–36.0)
MCV: 86.1 fL (ref 78.0–100.0)
RBC: 4.88 MIL/uL (ref 3.87–5.11)
RBC: 4.38 MIL/uL (ref 3.87–5.11)
RDW: 13.2 % (ref 11.5–15.5)
WBC: 11.8 10*3/uL — ABNORMAL HIGH (ref 4.0–10.5)

## 2013-01-18 LAB — BASIC METABOLIC PANEL
BUN: 16 mg/dL (ref 6–23)
CO2: 25 mEq/L (ref 19–32)
Chloride: 101 mEq/L (ref 96–112)
Creatinine, Ser: 0.87 mg/dL (ref 0.50–1.10)
GFR calc Af Amer: 75 mL/min — ABNORMAL LOW (ref >90)
GFR calc non Af Amer: 65 mL/min — ABNORMAL LOW (ref >90)
Glucose, Bld: 113 mg/dL — ABNORMAL HIGH (ref 70–99)
Potassium: 3.3 mEq/L — ABNORMAL LOW (ref 3.5–5.1)
Sodium: 139 mEq/L (ref 135–145)

## 2013-01-18 LAB — TSH: TSH: 3.454 u[IU]/mL (ref 0.350–4.500)

## 2013-01-18 LAB — DIFFERENTIAL
Eosinophils Absolute: 0.3 10*3/uL (ref 0.0–0.7)
Eosinophils Relative: 3 % (ref 0–5)
Lymphocytes Relative: 31 % (ref 12–46)
Lymphs Abs: 3.6 10*3/uL (ref 0.7–4.0)
Monocytes Absolute: 1.1 10*3/uL — ABNORMAL HIGH (ref 0.1–1.0)

## 2013-01-18 LAB — URINALYSIS, ROUTINE W REFLEX MICROSCOPIC
Glucose, UA: NEGATIVE mg/dL
Hgb urine dipstick: NEGATIVE
Ketones, ur: NEGATIVE mg/dL
Protein, ur: NEGATIVE mg/dL
pH: 5.5 (ref 5.0–8.0)

## 2013-01-18 LAB — GLUCOSE, CAPILLARY
Glucose-Capillary: 114 mg/dL — ABNORMAL HIGH (ref 70–99)
Glucose-Capillary: 120 mg/dL — ABNORMAL HIGH (ref 70–99)

## 2013-01-18 LAB — URINE MICROSCOPIC-ADD ON

## 2013-01-18 MED ORDER — MORPHINE SULFATE 4 MG/ML IJ SOLN
4.0000 mg | INTRAMUSCULAR | Status: DC | PRN
  Filled 2013-01-18: qty 1

## 2013-01-18 MED ORDER — ENOXAPARIN SODIUM 60 MG/0.6ML ~~LOC~~ SOLN
55.0000 mg | SUBCUTANEOUS | Status: DC
  Administered 2013-01-18 – 2013-01-20 (×3): 55 mg via SUBCUTANEOUS
  Filled 2013-01-18 (×4): qty 0.6

## 2013-01-18 MED ORDER — HYDROCODONE-ACETAMINOPHEN 5-325 MG PO TABS
1.0000 | ORAL_TABLET | ORAL | Status: DC | PRN
  Administered 2013-01-18 – 2013-01-20 (×6): 2 via ORAL
  Filled 2013-01-18 (×6): qty 2

## 2013-01-18 MED ORDER — TRIAMTERENE-HCTZ 37.5-25 MG PO TABS
1.0000 | ORAL_TABLET | Freq: Every day | ORAL | Status: DC
  Administered 2013-01-18 – 2013-01-20 (×3): 1 via ORAL
  Filled 2013-01-18 (×4): qty 1

## 2013-01-18 MED ORDER — PANTOPRAZOLE SODIUM 40 MG PO TBEC
40.0000 mg | DELAYED_RELEASE_TABLET | Freq: Every day | ORAL | Status: DC
  Administered 2013-01-18 – 2013-01-20 (×3): 40 mg via ORAL
  Filled 2013-01-18 (×3): qty 1

## 2013-01-18 MED ORDER — METOPROLOL SUCCINATE ER 50 MG PO TB24
50.0000 mg | ORAL_TABLET | Freq: Every day | ORAL | Status: DC
  Administered 2013-01-18 – 2013-01-20 (×3): 50 mg via ORAL
  Filled 2013-01-18 (×4): qty 1

## 2013-01-18 MED ORDER — ASPIRIN 81 MG PO CHEW
81.0000 mg | CHEWABLE_TABLET | Freq: Every day | ORAL | Status: DC
  Administered 2013-01-18 – 2013-01-20 (×3): 81 mg via ORAL
  Filled 2013-01-18 (×4): qty 1

## 2013-01-18 MED ORDER — POTASSIUM CHLORIDE CRYS ER 10 MEQ PO TBCR
10.0000 meq | EXTENDED_RELEASE_TABLET | Freq: Three times a day (TID) | ORAL | Status: AC
  Administered 2013-01-18 – 2013-01-19 (×6): 10 meq via ORAL
  Filled 2013-01-18 (×3): qty 1

## 2013-01-18 MED ORDER — POTASSIUM CHLORIDE CRYS ER 10 MEQ PO TBCR
10.0000 meq | EXTENDED_RELEASE_TABLET | Freq: Three times a day (TID) | ORAL | Status: DC
  Filled 2013-01-18 (×3): qty 1

## 2013-01-18 MED ORDER — MORPHINE SULFATE 2 MG/ML IJ SOLN
INTRAMUSCULAR | Status: AC
  Filled 2013-01-18: qty 1

## 2013-01-18 MED ORDER — RAMIPRIL 5 MG PO CAPS
5.0000 mg | ORAL_CAPSULE | Freq: Every day | ORAL | Status: DC
  Administered 2013-01-18 – 2013-01-20 (×3): 5 mg via ORAL
  Filled 2013-01-18 (×4): qty 1

## 2013-01-18 MED ORDER — ONDANSETRON HCL 4 MG/2ML IJ SOLN
4.0000 mg | Freq: Four times a day (QID) | INTRAMUSCULAR | Status: DC | PRN
  Administered 2013-01-18: 4 mg via INTRAVENOUS
  Filled 2013-01-18: qty 2

## 2013-01-18 MED ORDER — SODIUM CHLORIDE 0.9 % IV SOLN: INTRAVENOUS | Status: DC

## 2013-01-18 MED ORDER — INSULIN ASPART 100 UNIT/ML ~~LOC~~ SOLN
0.0000 [IU] | Freq: Three times a day (TID) | SUBCUTANEOUS | Status: DC
  Administered 2013-01-19: 1 [IU] via SUBCUTANEOUS

## 2013-01-18 MED ORDER — MORPHINE SULFATE 2 MG/ML IJ SOLN
2.0000 mg | INTRAMUSCULAR | Status: DC | PRN
  Administered 2013-01-18 (×2): 2 mg via INTRAVENOUS
  Filled 2013-01-18: qty 1

## 2013-01-18 MED ORDER — METFORMIN HCL ER 500 MG PO TB24
500.0000 mg | ORAL_TABLET | Freq: Every day | ORAL | Status: DC
  Administered 2013-01-18 – 2013-01-20 (×3): 500 mg via ORAL
  Filled 2013-01-18 (×6): qty 1

## 2013-01-18 MED ORDER — SODIUM CHLORIDE 0.9 % IV SOLN
INTRAVENOUS | Status: DC
  Administered 2013-01-18 – 2013-01-19 (×4): via INTRAVENOUS

## 2013-01-18 MED ORDER — ONDANSETRON HCL 4 MG PO TABS: 4.0000 mg | ORAL_TABLET | Freq: Four times a day (QID) | ORAL | Status: DC | PRN

## 2013-01-18 NOTE — H&P (Addendum)
[facility] History and Physical  [name] RUE:[id number] DOB: [birth year] DOA: 01/17/2013  Referring physician: ED physician PCP: Ruthe Mannan, MD   Chief Complaint: Left knee pain  HPI:  Pt is 67 yo female who presents to [facility] ED with main concern of progressively worsening left knee pain that she initially noted several days prior to admission, sharp and intermittent, non radiating, no specific alleviating factors but aggravated with ambulation, no other specific associated symptoms, 10/10 in severity when present with no specific radiations. Pt denies similar event in the past with the left knee but has had right knee replacement.   In ED, pt with persistent pain, [facility] asked to admit for further evaluation and ortho was consulted by ED doctor.   Assessment and Plan: Left knee pain - no acute findings on xray - will admit pt to medical floor for observation and pain management - will follow upon ortho recommendations - analgesia as needed, for pain control PT evaluation  Diabetes mellitus, type II - continue Metformin and add SSI for now Leukocytosis - with low grade fever, unclear source - will check CBC  Code Status: Full Family Communication: Pt at bedside Disposition Plan: Admit to medical floor    Review of Systems:  Constitutional: Negative for fever, chills and malaise/fatigue. Negative for diaphoresis.  HENT: Negative for hearing loss, ear pain, nosebleeds, congestion, sore throat, neck pain, tinnitus and ear discharge.   Eyes: Negative for blurred vision, double vision, photophobia, pain, discharge and redness.  Respiratory: Negative for cough, hemoptysis, sputum production, shortness of breath, wheezing and stridor.   Cardiovascular: Negative for chest pain, palpitations, orthopnea, claudication and leg swelling.  Gastrointestinal: Negative for nausea, vomiting and abdominal pain. Negative for heartburn, constipation, blood in stool and melena.   Genitourinary: Negative for dysuria, urgency, frequency, hematuria and flank pain.  Musculoskeletal: Negative for myalgias, back pain  Skin: Negative for itching and rash.  Neurological: Negative for tingling, tremors, sensory change, speech change, focal weakness, loss of consciousness and headaches.  Endo/Heme/Allergies: Negative for environmental allergies and polydipsia. Does not bruise/bleed easily.  Psychiatric/Behavioral: Negative for suicidal ideas. The patient is not nervous/anxious.      Past Medical History  Diagnosis Date  . Depression   . Hypertension   . GERD (gastroesophageal reflux disease)   . Hyperglycemia   . DJD (degenerative joint disease)   . Restless leg   . Peripheral neuropathy   . Diabetes mellitus     borderline  . Syncope and collapse   . Blood transfusion without reported diagnosis     Past Surgical History  Procedure Laterality Date  . Right knee replacement  2008  . Tubal ligation  1976  . Appendectomy  1976  . Tonsillectomy  childhood  . Cardiac catheterization  2006    @ [facility]  . Multiple tooth extractions    . Joint replacement  06/2005    right total knee  . Umbilical hernia repair  07/21/2012    Procedure: HERNIA REPAIR UMBILICAL ADULT;  Surgeon: Adolph Pollack, MD;  Location: [facility] ORS;  Service: General;  Laterality: N/A;  . Omentectomy  07/21/2012    Procedure: OMENTECTOMY;  Surgeon: Adolph Pollack, MD;  Location: [facility]S;  Service: General;  Laterality: N/A;    Social History:  reports that she has quit smoking. Her smoking use included Cigarettes. She has a 20 pack-year smoking history. She has never used smokeless tobacco. She reports that  drinks alcohol. She reports that she does  not use illicit drugs.  No Known Allergies  Family History  Problem Relation Age of Onset  . Parkinsonism Mother   . Hypertension Father   . Heart attack Father     Prior to Admission medications   Medication Sig Start Date End Date  Taking? Authorizing Provider  aspirin 81 MG tablet Take 81 mg by mouth daily.     Yes Historical Provider, MD  metFORMIN (GLUCOPHAGE-XR) 500 MG 24 hr tablet Take 1 tablet (500 mg total) by mouth daily with breakfast. 04/02/12  Yes Dianne Dun, MD  metoprolol succinate (TOPROL-XL) 50 MG 24 hr tablet Take 50 mg by mouth daily. Take with or immediately following a meal.   Yes Historical Provider, MD  omeprazole (PRILOSEC) 10 MG capsule Take 10 mg by mouth every other day. Every other day.   Yes Historical Provider, MD  ramipril (ALTACE) 5 MG capsule Take 5 mg by mouth daily.   Yes Historical Provider, MD  triamterene-hydrochlorothiazide (MAXZIDE-25) 37.5-25 MG per tablet Take 1 tablet by mouth daily.   Yes Historical Provider, MD  oxyCODONE-acetaminophen (PERCOCET/ROXICET) 5-325 MG per tablet Take 1 tablet by mouth every 6 (six) hours as needed for pain. 01/17/13   Carlyle Dolly, PA-C    Physical Exam: Filed Vitals:   01/17/13 2116  BP: 151/67  Pulse: 65  Temp: 99.2 F (37.3 C)  TempSrc: Oral  Resp: 18  SpO2: 99%    Physical Exam  Constitutional: Appears well-developed and well-nourished. No distress.  HENT: Normocephalic. External right and left ear normal. Oropharynx is clear and moist.  Eyes: Conjunctivae and EOM are normal. PERRLA, no scleral icterus.  Neck: Normal ROM. Neck supple. No JVD. No tracheal deviation. No thyromegaly.  CVS: RRR, S1/S2 +, no murmurs, no gallops, no carotid bruit.  Pulmonary: Effort and breath sounds normal, no stridor, rhonchi, wheezes, rales.  Abdominal: Soft. BS +,  no distension, tenderness, rebound or guarding.  Musculoskeletal: Normal range of motion. No edema and no tenderness.  Lymphadenopathy: No lymphadenopathy noted, cervical, inguinal. Neuro: Alert. Normal reflexes, muscle tone coordination. No cranial nerve deficit. Skin: Skin is warm and dry. No rash noted. Not diaphoretic. No erythema. No pallor.  Psychiatric: Normal mood and affect.  Behavior, judgment, thought content normal.   Labs on Admission:  Basic Metabolic Panel:  Recent Labs Lab 01/18/13 0207  NA 139  K 3.6  CL 101  CO2 25  GLUCOSE 113*  BUN 16  CREATININE 0.87  CALCIUM 10.1   CBC:  Recent Labs Lab 01/18/13 0207  WBC 11.8*  NEUTROABS 6.7  HGB 14.2  HCT 42.0  MCV 86.1  PLT 279   Cardiac Enzymes: No results found for this basename: CKTOTAL, CKMB, CKMBINDEX, TROPONINI,  in the last 168 hours BNP: No components found with this basename: POCBNP,  CBG:  Recent Labs Lab 01/18/13 0359  GLUCAP 114*    Radiological Exams on Admission: Dg Knee Complete 4 Views Left  01/17/2013   *RADIOLOGY REPORT*  Clinical Data: Knee gave out while the patient was walking; diffuse left knee pain.  LEFT KNEE - COMPLETE 4+ VIEW  Comparison: None.  Findings: There is no evidence of fracture or dislocation.  The joint spaces are preserved.  No significant degenerative change is seen; the patellofemoral joint is grossly unremarkable in appearance.  A fabella is noted.  Trace joint fluid remains within normal limits.  Scattered soft tissue calcifications are seen along the proximal lower leg.  IMPRESSION: No evidence of fracture or dislocation.  Original Report Authenticated By: Tonia Ghent, M.D.    EKG: Normal sinus rhythm, no ST/T wave changes  Debbora Presto, MD  [facility] Pager [phone]  If 7PM-7AM, please contact night-coverage [email or web address] Password [facility] 01/18/2013, 4:17 AM

## 2013-01-18 NOTE — Progress Notes (Signed)
PT Cancellation Note  Patient Details Name: [name] MRN: [id number] DOB: March 21, 1946   Cancelled Treatment:    Reason Eval/Treat Not Completed: Medical issues which prohibited therapy.  Awaiting WB status and MRI results to mobilize pt.  Will follow.   Thanks,    Vista Deck 01/18/2013, 4:55 PM

## 2013-01-18 NOTE — ED Notes (Signed)
System went down for epic.  Pt charting transferred to paper charting.  See Paper charting.

## 2013-01-18 NOTE — Consult Note (Signed)
Reason for Consult: Left knee pain Referring Physician: Dr. Alfonse Alpers [name] is an 67 y.o. female.  HPI: [name] has had a  3-4 week history of left knee pain, atraumatic in onset, which became acutely severe yesterday. Pain has been mainly with weight bearing and minimal to no rest pain. Yesterday she was stepping down stairs and had sharp onset of severe knee pain and was unable to bear weight comfortably after that. She did not fall. Taken to ED by EMS. Denies fever, chills or constitutional symptoms. Has had remote falls in past but none recently. Has  had swelling anteriorly below the knee since these remote falls and this has not changed. States that pain in knee can radiate to ankle and to hip but majority of pain is centered in knee. Admitted to medical service and we were consulted to assess. Patient of Dr. Darrelyn Hillock from previous right TKA.  Past Medical History  Diagnosis Date  . Depression   . Hypertension   . GERD (gastroesophageal reflux disease)   . Hyperglycemia   . DJD (degenerative joint disease)   . Restless leg   . Peripheral neuropathy   . Diabetes mellitus     borderline  . Syncope and collapse   . Blood transfusion without reported diagnosis     Past Surgical History  Procedure Laterality Date  . Right knee replacement  2008  . Tubal ligation  1976  . Appendectomy  1976  . Tonsillectomy  childhood  . Cardiac catheterization  2006    @ [facility]  . Multiple tooth extractions    . Joint replacement  06/2005    right total knee  . Umbilical hernia repair  07/21/2012    Procedure: HERNIA REPAIR UMBILICAL ADULT;  Surgeon: Adolph Pollack, MD;  Location: [facility];  Service: General;  Laterality: N/A;  . Omentectomy  07/21/2012    Procedure: OMENTECTOMY;  Surgeon: Adolph Pollack, MD;  Location: [facility];  Service: General;  Laterality: N/A;    Family History  Problem Relation Age of Onset  . Parkinsonism Mother   . Hypertension Father   . Heart attack  Father     Social History:  reports that she has quit smoking. Her smoking use included Cigarettes. She has a 20 pack-year smoking history. She has never used smokeless tobacco. She reports that  drinks alcohol. She reports that she does not use illicit drugs.  Allergies: No Known Allergies  Medications: I have reviewed the patient's current medications.  Results for orders placed during the hospital encounter of 01/17/13 (from the past 48 hour(s))  BASIC METABOLIC PANEL     Status: Abnormal   Collection Time    01/18/13  2:07 AM      Result Value Range   Sodium 139  135 - 145 mEq/L   Potassium 3.6  3.5 - 5.1 mEq/L   Chloride 101  96 - 112 mEq/L   CO2 25  19 - 32 mEq/L   Glucose, Bld 113 (*) 70 - 99 mg/dL   BUN 16  6 - 23 mg/dL   Creatinine, Ser 1.61  0.50 - 1.10 mg/dL   Calcium 09.6  8.4 - 04.5 mg/dL   GFR calc non Af Amer 67 (*) >90 mL/min   GFR calc Af Amer 78 (*) >90 mL/min   Comment:            The eGFR has been calculated     using the CKD EPI equation.  This calculation has not been     validated in all clinical     situations.     eGFR's persistently     <90 mL/min signify     possible Chronic Kidney Disease.  CBC     Status: Abnormal   Collection Time    01/18/13  2:07 AM      Result Value Range   WBC 11.8 (*) 4.0 - 10.5 K/uL   RBC 4.88  3.87 - 5.11 MIL/uL   Hemoglobin 14.2  12.0 - 15.0 g/dL   HCT 40.9  81.1 - 91.4 %   MCV 86.1  78.0 - 100.0 fL   MCH 29.1  26.0 - 34.0 pg   MCHC 33.8  30.0 - 36.0 g/dL   RDW 78.2  95.6 - 21.3 %   Platelets 279  150 - 400 K/uL  DIFFERENTIAL     Status: Abnormal   Collection Time    01/18/13  2:07 AM      Result Value Range   Neutrophils Relative % 57  43 - 77 %   Neutro Abs 6.7  1.7 - 7.7 K/uL   Lymphocytes Relative 31  12 - 46 %   Lymphs Abs 3.6  0.7 - 4.0 K/uL   Monocytes Relative 9  3 - 12 %   Monocytes Absolute 1.1 (*) 0.1 - 1.0 K/uL   Eosinophils Relative 3  0 - 5 %   Eosinophils Absolute 0.3  0.0 - 0.7 K/uL    Basophils Relative 1  0 - 1 %   Basophils Absolute 0.1  0.0 - 0.1 K/uL  GLUCOSE, CAPILLARY     Status: Abnormal   Collection Time    01/18/13  3:59 AM      Result Value Range   Glucose-Capillary 114 (*) 70 - 99 mg/dL  URINALYSIS, ROUTINE W REFLEX MICROSCOPIC     Status: Abnormal   Collection Time    01/18/13  5:10 AM      Result Value Range   Color, Urine YELLOW  YELLOW   APPearance CLOUDY (*) CLEAR   Specific Gravity, Urine 1.024  1.005 - 1.030   pH 5.5  5.0 - 8.0   Glucose, UA NEGATIVE  NEGATIVE mg/dL   Hgb urine dipstick NEGATIVE  NEGATIVE   Bilirubin Urine NEGATIVE  NEGATIVE   Ketones, ur NEGATIVE  NEGATIVE mg/dL   Protein, ur NEGATIVE  NEGATIVE mg/dL   Urobilinogen, UA 0.2  0.0 - 1.0 mg/dL   Nitrite NEGATIVE  NEGATIVE   Leukocytes, UA SMALL (*) NEGATIVE  URINE MICROSCOPIC-ADD ON     Status: Abnormal   Collection Time    01/18/13  5:10 AM      Result Value Range   Squamous Epithelial / LPF MANY (*) RARE   WBC, UA 7-10  <3 WBC/hpf   RBC / HPF 0-2  <3 RBC/hpf   Bacteria, UA MANY (*) RARE   Urine-Other MUCOUS PRESENT    BASIC METABOLIC PANEL     Status: Abnormal   Collection Time    01/18/13  7:25 AM      Result Value Range   Sodium 139  135 - 145 mEq/L   Potassium 3.3 (*) 3.5 - 5.1 mEq/L   Chloride 103  96 - 112 mEq/L   CO2 27  19 - 32 mEq/L   Glucose, Bld 110 (*) 70 - 99 mg/dL   BUN 16  6 - 23 mg/dL   Creatinine, Ser 0.86  0.50 - 1.10 mg/dL  Calcium 9.5  8.4 - 10.5 mg/dL   GFR calc non Af Amer 65 (*) >90 mL/min   GFR calc Af Amer 75 (*) >90 mL/min   Comment:            The eGFR has been calculated     using the CKD EPI equation.     This calculation has not been     validated in all clinical     situations.     eGFR's persistently     <90 mL/min signify     possible Chronic Kidney Disease.  CBC     Status: Abnormal   Collection Time    01/18/13  7:25 AM      Result Value Range   WBC 11.6 (*) 4.0 - 10.5 K/uL   RBC 4.38  3.87 - 5.11 MIL/uL   Hemoglobin  12.7  12.0 - 15.0 g/dL   HCT 16.1  09.6 - 04.5 %   MCV 87.0  78.0 - 100.0 fL   MCH 29.0  26.0 - 34.0 pg   MCHC 33.3  30.0 - 36.0 g/dL   RDW 40.9  81.1 - 91.4 %   Platelets 258  150 - 400 K/uL  GLUCOSE, CAPILLARY     Status: Abnormal   Collection Time    01/18/13  8:44 AM      Result Value Range   Glucose-Capillary 110 (*) 70 - 99 mg/dL  SEDIMENTATION RATE     Status: None   Collection Time    01/18/13  9:05 AM      Result Value Range   Sed Rate 13  0 - 22 mm/hr    Dg Femur Left  01/18/2013   *RADIOLOGY REPORT*  Clinical Data: Knee gave out while the patient was walking; anterior knee pain, radiating of the femur.  LEFT FEMUR - 2 VIEW  Comparison: Left knee radiographs performed 01/17/2013  Findings: The left femur appears intact.  There is no evidence of fracture or dislocation.  Cortical irregularity is noted along the articular surface of the patella.  Visualized joint spaces are preserved.  No significant knee joint effusion is identified.  Scattered soft tissue calcifications are noted along the proximal lower leg.  The left femoral head remains seated at the acetabulum.  No additional soft tissue abnormalities are characterized on radiograph.  IMPRESSION:  1.  No evidence of fracture or dislocation. 2.  Mild degenerative change noted along the articular surface of the patella.   Original Report Authenticated By: Tonia Ghent, M.D.   Dg Knee Complete 4 Views Left  01/17/2013   *RADIOLOGY REPORT*  Clinical Data: Knee gave out while the patient was walking; diffuse left knee pain.  LEFT KNEE - COMPLETE 4+ VIEW  Comparison: None.  Findings: There is no evidence of fracture or dislocation.  The joint spaces are preserved.  No significant degenerative change is seen; the patellofemoral joint is grossly unremarkable in appearance.  A fabella is noted.  Trace joint fluid remains within normal limits.  Scattered soft tissue calcifications are seen along the proximal lower leg.  IMPRESSION: No  evidence of fracture or dislocation.   Original Report Authenticated By: Tonia Ghent, M.D.    Review of Systems  Constitutional: Negative.   HENT: Positive for neck pain.   Eyes: Negative.   Respiratory: Negative.   Cardiovascular: Negative.   Gastrointestinal: Negative.   Genitourinary: Negative.   Musculoskeletal: Positive for back pain and joint pain.  Skin: Negative.   Neurological: Negative.  Endo/Heme/Allergies: Negative.   Psychiatric/Behavioral: Negative.    Blood pressure 153/68, pulse 68, temperature 97.4 F (36.3 C), temperature source Oral, resp. rate 18, height 5' 3.5" (1.613 m), weight 240 lb 15.4 oz (109.3 kg), SpO2 100.00%. Physical Exam Physical Examination: General appearance - alert, well appearing, and in no distress Mental status - alert, oriented to person, place, and time Neck - supple, no significant adenopathy Chest - clear to auscultation, no wheezes, rales or rhonchi, symmetric air entry Heart - normal rate, regular rhythm, normal S1, S2, no murmurs, rubs, clicks or gallops Abdomen - soft, nontender, nondistended, no masses or organomegaly Neurological - alert, oriented, normal speech, no focal findings or movement disorder noted Extremities - intact peripheral pulses, mild edema LLE and tender over anterior tibia. Has swelling (chronic according to patient) just below knee Left KNEE EXAM- Trace effusion; no warmth; ROM 0-125- no pain on ROM; Very tender medial joint line and mild tenderness laterally; No instability; no pain on ROM left hip  X-rays- No fracture; minimal OA; small focus of heterotopic ossification anterior lower leg just below knee (corresponds to area of chronic swelling clinically)   Assessment/Plan: Left knee pain- Has significant weight bearing pain and not much pain at rest. Exam does not suggest septic joint. My concern is for a stress reaction/stress fracture. Recommend MRI to r/o fracture.Will follow.  Loanne Drilling 01/18/2013, 11:32 AM

## 2013-01-18 NOTE — Progress Notes (Signed)
[facility] PROGRESS NOTE  [name] ZOX:[id number] DOB: Dec 30, 1945 DOA: 01/17/2013 PCP: Ruthe Mannan, MD  Brief narrative: [name] is an 67 y.o. female with a past medical history of diabetes who presented to the emergency room on 01/18/2013 with severe left knee pain, negative for acute fracture or other findings on pain radiography. Orthopedics consultation has been requested and is pending.  Assessment/Plan: Principal Problem:   Left knee pain -Plain films negative for fracture or other acute findings. -Orthopedics consultation pending.  May need steroid injection as I suspect this is from OA. -Check ESR/CRP. May need to rule out septic joint. Active Problems:   Hypokalemia -Replace potassium.   Hypertension -Continue metoprolol, Altase, and Maxzide.   GERD (gastroesophageal reflux disease) -Continue PPI therapy.   Diabetes mellitus -Continue metformin.   Leukocytosis, unspecified -Mild.  Code Status: Full Family Communication: None present. Disposition Plan: Home when stable.   Medical Consultants:  Dr. Berton Lan, Orthopedics.  Other Consultants:  PT  Anti-infectives:  None.  HPI/Subjective: [name] is having some left-sided knee pain for which she is taking morphine. She reports that she has had a lot of pain and has been unable to ambulate or bear weight on her left knee secondary to pain. She has a history of right total knee replacement. She has a history of arthritis. She has had some nausea which she thinks is related to morphine for pain control. No vomiting.  Objective: Filed Vitals:   01/17/13 2116 01/18/13 0405  BP: 151/67 153/68  Pulse: 65 68  Temp: 99.2 F (37.3 C) 97.4 F (36.3 C)  TempSrc: Oral Oral  Resp: 18 18  Height:  5' 3.5" (1.613 m)  Weight:  109.3 kg (240 lb 15.4 oz)  SpO2: 99% 100%    Intake/Output Summary (Last 24 hours) at 01/18/13 0751 Last data filed at 01/18/13 0503  Gross per 24 hour  Intake  38.75  ml  Output      0 ml  Net  38.75 ml    Exam: Gen:  NAD, Obese Cardiovascular:  RRR, No M/R/G Respiratory:  Lungs CTAB Gastrointestinal:  Abdomen soft, NT/ND, + BS Extremities:  Trace edema.  Scar right knee, left knee tender to palpation with some soft tissue swelling inferior to knee.  Data Reviewed: Basic Metabolic Panel:  Recent Labs Lab 01/18/13 0207  NA 139  K 3.6  CL 101  CO2 25  GLUCOSE 113*  BUN 16  CREATININE 0.87  CALCIUM 10.1   GFR Estimated Creatinine Clearance: 74.2 ml/min (by C-G formula based on Cr of 0.87).  CBC:  Recent Labs Lab 01/18/13 0207  WBC 11.8*  NEUTROABS 6.7  HGB 14.2  HCT 42.0  MCV 86.1  PLT 279   CBG:  Recent Labs Lab 01/18/13 0359  GLUCAP 114*   Microbiology No results found for this or any previous visit (from the past 240 hour(s)).   Procedures and Diagnostic Studies: Dg Femur Left  01/18/2013   *RADIOLOGY REPORT*  Clinical Data: Knee gave out while the patient was walking; anterior knee pain, radiating of the femur.  LEFT FEMUR - 2 VIEW  Comparison: Left knee radiographs performed 01/17/2013  Findings: The left femur appears intact.  There is no evidence of fracture or dislocation.  Cortical irregularity is noted along the articular surface of the patella.  Visualized joint spaces are preserved.  No significant knee joint effusion is identified.  Scattered soft tissue calcifications are noted along the proximal lower leg.  The  left femoral head remains seated at the acetabulum.  No additional soft tissue abnormalities are characterized on radiograph.  IMPRESSION:  1.  No evidence of fracture or dislocation. 2.  Mild degenerative change noted along the articular surface of the patella.   Original Report Authenticated By: Tonia Ghent, M.D.   Dg Knee Complete 4 Views Left  01/17/2013   *RADIOLOGY REPORT*  Clinical Data: Knee gave out while the patient was walking; diffuse left knee pain.  LEFT KNEE - COMPLETE 4+ VIEW   Comparison: None.  Findings: There is no evidence of fracture or dislocation.  The joint spaces are preserved.  No significant degenerative change is seen; the patellofemoral joint is grossly unremarkable in appearance.  A fabella is noted.  Trace joint fluid remains within normal limits.  Scattered soft tissue calcifications are seen along the proximal lower leg.  IMPRESSION: No evidence of fracture or dislocation.   Original Report Authenticated By: Tonia Ghent, M.D.    Scheduled Meds: . aspirin  81 mg Oral Daily  . enoxaparin (LOVENOX) injection  55 mg Subcutaneous Q24H  . insulin aspart  0-9 Units Subcutaneous TID WC  . metFORMIN  500 mg Oral Q breakfast  . metoprolol succinate  50 mg Oral Daily  . morphine      . pantoprazole  40 mg Oral Daily  . ramipril  5 mg Oral Daily  . triamterene-hydrochlorothiazide  1 tablet Oral Daily   Continuous Infusions: . sodium chloride 75 mL/hr at 01/18/13 0432    Time spent: 25 minutes.   LOS: 1 day   [name]  [facility] Pager [phone].  If 8PM-8AM, please contact night-coverage at [email or web address], password [facility] 01/18/2013, 7:51 AM

## 2013-01-19 ENCOUNTER — Encounter (HOSPITAL_COMMUNITY): Payer: Self-pay | Admitting: Internal Medicine

## 2013-01-19 DIAGNOSIS — S83207A Unspecified tear of unspecified meniscus, current injury, left knee, initial encounter: Secondary | ICD-10-CM

## 2013-01-19 HISTORY — DX: Unspecified tear of unspecified meniscus, current injury, left knee, initial encounter: S83.207A

## 2013-01-19 LAB — GLUCOSE, CAPILLARY: Glucose-Capillary: 103 mg/dL — ABNORMAL HIGH (ref 70–99)

## 2013-01-19 LAB — URINE CULTURE: Colony Count: 50000

## 2013-01-19 NOTE — Progress Notes (Signed)
Rehab Admissions Coordinator Note:  Patient was screened by Trish Mage for appropriateness for an Inpatient Acute Rehab Consult.  Noted PT recommending CIR.  At this time, we are recommending Inpatient Rehab consult.  Trish Mage 01/19/2013, 1:44 PM  I can be reached at [phone].

## 2013-01-19 NOTE — Evaluation (Signed)
Physical Therapy Evaluation Patient Details Name: [name] MRN: [id number] DOB: 04/16/1946 Today's Date: 01/19/2013 Time: 3086-5784 PT Time Calculation (min): 36 min  PT Assessment / Plan / Recommendation Clinical Impression  Pt presents with acute left knee pain with MRI showing meniscal tear.  Per ortho MD, she is to be 50% PWB and this PT maintained KI during session for pain control and stability.  Tolerated OOB and some steps with RW, however requires mod/max assist at this time.  Pt with husband available 24/7, however he has charcot foot disorder and unable to provide heavy lifting assist.  Pt will benefit from skilled PT in acute venue to address deficits.  PT recommends CIR for follow up at DC to maximize pts safety and fucntion.     PT Assessment  Patient needs continued PT services    Follow Up Recommendations  CIR;Supervision/Assistance - 24 hour    Does the patient have the potential to tolerate intense rehabilitation      Barriers to Discharge Decreased caregiver support      Equipment Recommendations  None recommended by PT    Recommendations for Other Services OT consult   Frequency Min 5X/week    Precautions / Restrictions Precautions Precautions: Knee;Fall Precaution Comments: Pt with no order for maintaining KI, however maintained during session for pain control and stability.  Required Braces or Orthoses: Knee Immobilizer - Left Restrictions Weight Bearing Restrictions: Yes LLE Weight Bearing: Partial weight bearing LLE Partial Weight Bearing Percentage or Pounds: 50%   Pertinent Vitals/Pain 6/10 pain with WB      Mobility  Bed Mobility Bed Mobility: Supine to Sit Supine to Sit: 4: Min guard;With rails;HOB elevated Details for Bed Mobility Assistance: Min/guard for LLE out of bed with cues for hand placement on bed to self assist.  Transfers Transfers: Sit to Stand;Stand to Sit Sit to Stand: 4: Min assist;With upper extremity assist;From  bed Stand to Sit: 4: Min assist;With upper extremity assist;With armrests;To chair/3-in-1 Details for Transfer Assistance: Assist to rise, steady and ensure controlled descent with cues for hand placement, LE management and for putting some weight through LLE when in standing, as she tends to avoid WB.  Ambulation/Gait Ambulation/Gait Assistance: 3: Mod assist;2: Max Environmental consultant (Feet): 7 Feet Assistive device: Rolling walker Ambulation/Gait Assistance Details: Assist to steady throughout and provide assist for weight shifting with cues for sequencing/technique with RW and increasing UE WB to maintain PWB status.  Gait Pattern: Step-to pattern;Decreased stride length;Antalgic;Trunk flexed Gait velocity: decreased Stairs: No Wheelchair Mobility Wheelchair Mobility: No    Exercises     PT Diagnosis: Difficulty walking;Generalized weakness;Abnormality of gait;Acute pain  PT Problem List: Decreased strength;Decreased range of motion;Decreased activity tolerance;Decreased balance;Decreased mobility;Decreased coordination;Decreased knowledge of use of DME;Decreased safety awareness;Decreased knowledge of precautions;Pain PT Treatment Interventions: DME instruction;Gait training;Functional mobility training;Therapeutic activities;Therapeutic exercise;Balance training;Patient/family education   PT Goals Acute Rehab PT Goals PT Goal Formulation: With patient/family Time For Goal Achievement: 01/26/13 Potential to Achieve Goals: Good Pt will go Supine/Side to Sit: with supervision PT Goal: Supine/Side to Sit - Progress: Goal set today Pt will go Sit to Supine/Side: with supervision PT Goal: Sit to Supine/Side - Progress: Goal set today Pt will go Sit to Stand: with supervision PT Goal: Sit to Stand - Progress: Goal set today Pt will go Stand to Sit: with supervision PT Goal: Stand to Sit - Progress: Goal set today Pt will Ambulate: 51 - 150 feet;with min assist;with least  restrictive assistive device PT Goal:  Ambulate - Progress: Goal set today Pt will Perform Home Exercise Program: with supervision, verbal cues required/provided PT Goal: Perform Home Exercise Program - Progress: Goal set today  Visit Information  Last PT Received On: 01/19/13 Assistance Needed: +2 (if ambulating. )    Subjective Data  Subjective: I can't go home like this Patient Stated Goal: to get some rehab before going home.    Prior Functioning  Home Living Lives With: Spouse Available Help at Discharge: Family;Available 24 hours/day (husband has charcot foot and unable to fully assist. ) Type of Home: House Home Access: Stairs to enter [employer] of Steps: 3 then 1 Entrance Stairs-Rails: Left Home Layout: One level Bathroom Shower/Tub: Health visitor:  (comfort height) Home Adaptive Equipment: Built-in shower seat;Walker - rolling;Straight cane;Bedside commode/3-in-1 Prior Function Level of Independence: Independent Able to Take Stairs?: Yes Driving: Yes Vocation: Retired Musician: No difficulties    Copywriter, advertising Arousal/Alertness: Awake/alert Behavior During Therapy: WFL for tasks assessed/performed Overall Cognitive Status: Within Functional Limits for tasks assessed    Extremity/Trunk Assessment Right Lower Extremity Assessment RLE ROM/Strength/Tone: WFL for tasks assessed RLE Sensation: WFL - Light Touch Left Lower Extremity Assessment LLE ROM/Strength/Tone: Deficits LLE ROM/Strength/Tone Deficits: painful SLR, however able to perform through most ROM.  LLE Sensation: WFL - Light Touch Trunk Assessment Trunk Assessment: Normal   Balance    End of Session PT - End of Session Equipment Utilized During Treatment: Gait belt;Left knee immobilizer Activity Tolerance: Patient limited by pain Patient left: in chair;with call bell/phone within reach;with family/visitor present Nurse Communication: Mobility  status  GP     Vista Deck 01/19/2013, 1:29 PM

## 2013-01-19 NOTE — Progress Notes (Signed)
[facility] PROGRESS NOTE  [name]:[id number] DOB: 1946-07-06 DOA: 01/17/2013 PCP: Ruthe Mannan, MD  Brief narrative: [name] is an 67 y.o. female with a past medical history of diabetes who presented to the emergency room on 01/18/2013 with severe left knee pain, negative for acute fracture or other findings on pain radiography. Subsequent MRI revealed a meniscal tear. Seen by orthopedic surgeon with no urgent indication for surgery.  Assessment/Plan: Principal Problem:   Left knee pain secondary to acute meniscal tear -Plain films negative for fracture or other acute findings. -MRI with meniscal tear and stress reaction in the tibia. -Orthopedics consultation for form by Dr. Despina Hick on 01/19/2013.   -ESR within normal limits and physical exam findings not consistent with septic joint. -CIR consult for rehabilitation. Active Problems:   Hypokalemia -Potassium replacement ordered.   Hypertension -Continue metoprolol, Altace, and Maxzide.   GERD (gastroesophageal reflux disease) -Continue PPI therapy.   Diabetes mellitus -Continue metformin.   Leukocytosis, unspecified -Mild.  Code Status: Full Family Communication: None present. Disposition Plan: ? CIR.   Medical Consultants:  Dr. Berton Lan, Orthopedics.  Other Consultants:  PT: CIR versus home with 24 hour supervision.  Anti-infectives:  None.  HPI/Subjective: [name] continues to complain of left-sided knee pain and inability to ambulate. Appetite is good. No nausea or vomiting.  Objective: Filed Vitals:   01/18/13 0405 01/18/13 2100 01/19/13 0500 01/19/13 1320  BP: 153/68 113/52 124/58 107/42  Pulse: 68 57 64 67  Temp: 97.4 F (36.3 C) 97.9 F (36.6 C) 97.5 F (36.4 C) 97.2 F (36.2 C)  TempSrc: Oral Oral Oral   Resp: 18 18 20 16   Height: 5' 3.5" (1.613 m)     Weight: 109.3 kg (240 lb 15.4 oz)     SpO2: 100% 99% 99% 98%    Intake/Output Summary (Last 24 hours) at 01/19/13  1543 Last data filed at 01/19/13 1300  Gross per 24 hour  Intake 1358.75 ml  Output   1600 ml  Net -241.25 ml    Exam: Gen:  NAD, Obese Cardiovascular:  RRR, No M/R/G Respiratory:  Lungs CTAB Gastrointestinal:  Abdomen soft, NT/ND, + BS Extremities:  Trace edema.  Scar right knee, left knee tender to palpation with some soft tissue swelling inferior to knee, unchanged.  Data Reviewed: Basic Metabolic Panel:  Recent Labs Lab 01/18/13 0207 01/18/13 0725  NA 139 139  K 3.6 3.3*  CL 101 103  CO2 25 27  GLUCOSE 113* 110*  BUN 16 16  CREATININE 0.87 0.89  CALCIUM 10.1 9.5   GFR Estimated Creatinine Clearance: 72.5 ml/min (by C-G formula based on Cr of 0.89).  CBC:  Recent Labs Lab 01/18/13 0207 01/18/13 0725  WBC 11.8* 11.6*  NEUTROABS 6.7  --   HGB 14.2 12.7  HCT 42.0 38.1  MCV 86.1 87.0  PLT 279 258   CBG:  Recent Labs Lab 01/18/13 1145 01/18/13 1743 01/18/13 2115 01/19/13 0733 01/19/13 1221  GLUCAP 114* 109* 120* 101* 111*   Microbiology Recent Results (from the past 240 hour(s))  URINE CULTURE     Status: None   Collection Time    01/18/13  5:10 AM      Result Value Range Status   Specimen Description URINE, RANDOM   Final   Special Requests NONE   Final   Culture  Setup Time 01/18/2013 13:11   Final   Colony Count 50,000 COLONIES/ML   Final   Culture  Final   Value: Multiple bacterial morphotypes present, none predominant. Suggest appropriate recollection if clinically indicated.   Report Status 01/19/2013 FINAL   Final     Procedures and Diagnostic Studies: Dg Femur Left  01/18/2013   *RADIOLOGY REPORT*  Clinical Data: Knee gave out while the patient was walking; anterior knee pain, radiating of the femur.  LEFT FEMUR - 2 VIEW  Comparison: Left knee radiographs performed 01/17/2013  Findings: The left femur appears intact.  There is no evidence of fracture or dislocation.  Cortical irregularity is noted along the articular surface of the  patella.  Visualized joint spaces are preserved.  No significant knee joint effusion is identified.  Scattered soft tissue calcifications are noted along the proximal lower leg.  The left femoral head remains seated at the acetabulum.  No additional soft tissue abnormalities are characterized on radiograph.  IMPRESSION:  1.  No evidence of fracture or dislocation. 2.  Mild degenerative change noted along the articular surface of the patella.   Original Report Authenticated By: Tonia Ghent, M.D.   Dg Knee Complete 4 Views Left  01/17/2013   *RADIOLOGY REPORT*  Clinical Data: Knee gave out while the patient was walking; diffuse left knee pain.  LEFT KNEE - COMPLETE 4+ VIEW  Comparison: None.  Findings: There is no evidence of fracture or dislocation.  The joint spaces are preserved.  No significant degenerative change is seen; the patellofemoral joint is grossly unremarkable in appearance.  A fabella is noted.  Trace joint fluid remains within normal limits.  Scattered soft tissue calcifications are seen along the proximal lower leg.  IMPRESSION: No evidence of fracture or dislocation.   Original Report Authenticated By: Tonia Ghent, M.D.   Mr Knee Left  Wo Contrast  01/18/2013   *RADIOLOGY REPORT*  Clinical Data:  Knee pain.  MRI OF THE LEFT KNEE WITHOUT CONTRAST  Technique:  Multiplanar, multisequence MR imaging of the left knee was performed.  No intravenous contrast was administered.  Comparison:  Left knee radiographs 01/18/2013.  FINDINGS: MENISCI Medial:  Posterior horn tear at the meniscal root with partial detachment and mild medial protrusion of the meniscus.  Type 2 signal changes in the anterior horn and midbody regions. Lateral:  Intact.  LIGAMENTS Cruciates:  Intact. Collaterals:  Intact.  CARTILAGE Patellofemoral:  Advanced degenerative chondrosis with full or near full-thickness cartilage loss along the medial aspect of the patellar apex. Medial:  Moderate degenerative chondrosis. Lateral:   Mild to moderate degenerative chondrosis.  Joint:  A small joint effusion is noted.  A medial patellar plica is present. Popliteal Fossa:  Small Baker's cyst. Extensor Mechanism:  The patella retinacular structures are intact and the quadriceps and patellar tendons are intact. Bones: Marrow edema in the medial tibial spine posteriorly may be related to the meniscal root tear with associated stress-related change.  No stress fracture or osteochondral lesions.  IMPRESSION:  1.  Posterior horn medial meniscus tear at the meniscal root with partial detachment. 2.  Intact ligamentous structures. 3.  Tricompartmental degenerative changes. 4.  Edema like signal abnormality in the medial tibial spine posteriorly likely stress-related process. 5.  Small joint effusion and small Baker's cyst.   Original Report Authenticated By: Rudie Meyer, M.D.    Scheduled Meds: . aspirin  81 mg Oral Daily  . enoxaparin (LOVENOX) injection  55 mg Subcutaneous Q24H  . insulin aspart  0-9 Units Subcutaneous TID WC  . metFORMIN  500 mg Oral Q breakfast  . metoprolol succinate  50 mg Oral Daily  . pantoprazole  40 mg Oral Daily  . potassium chloride  10 mEq Oral TID  . ramipril  5 mg Oral Daily  . triamterene-hydrochlorothiazide  1 tablet Oral Daily   Continuous Infusions: . sodium chloride 75 mL/hr at 01/19/13 1000    Time spent: 25 minutes.   LOS: 2 days   RAMA,CHRISTINA  [facility] Pager [phone].  If 8PM-8AM, please contact night-coverage at [email or web address], password [facility] 01/19/2013, 3:43 PM

## 2013-01-19 NOTE — Progress Notes (Signed)
Subjective: Patient still with knee pain but not as intense. Had MRI yesterday and is wondering about activity and weight bearing status   Objective: Vital signs in last 24 hours: Temp:  [97.5 F (36.4 C)-97.9 F (36.6 C)] 97.5 F (36.4 C) (05/26 0500) Pulse Rate:  [57-64] 64 (05/26 0500) Resp:  [18-20] 20 (05/26 0500) BP: (113-124)/(52-58) 124/58 mmHg (05/26 0500) SpO2:  [99 %] 99 % (05/26 0500)  Intake/Output from previous day: 05/25 0701 - 05/26 0700 In: 878.8 [P.O.:120; I.V.:758.8] Out: 900 [Urine:900] Intake/Output this shift: Total I/O In: 758.8 [I.V.:758.8] Out: 900 [Urine:900]   Recent Labs  01/18/13 0207 01/18/13 0725  HGB 14.2 12.7    Recent Labs  01/18/13 0207 01/18/13 0725  WBC 11.8* 11.6*  RBC 4.88 4.38  HCT 42.0 38.1  PLT 279 258    Recent Labs  01/18/13 0207 01/18/13 0725  NA 139 139  K 3.6 3.3*  CL 101 103  CO2 25 27  BUN 16 16  CREATININE 0.87 0.89  GLUCOSE 113* 110*  CALCIUM 10.1 9.5   No results found for this basename: LABPT, INR,  in the last 72 hours  Left knee no warmth or effusion.Has medial tenderness but overall less sensitivity than yesterday. No instability. ROm 0-110  Assessment/Plan: Left knee pain- MRI with meniscal tear and stress reaction in tibia. The stress reaction is probably accounting for her weight bearing pain as I have never seen a meniscal tear prevent someone from bearing weight unless their knee is locked, and her knee is not locked.She can be 50% partial weight bearing LLE with a walker. PT to evaluate and she can go home if she can mobilize well enough today. Follow-up in office in 2 weeks.   Loanne Drilling 01/19/2013, 6:59 AM

## 2013-01-20 DIAGNOSIS — G609 Hereditary and idiopathic neuropathy, unspecified: Secondary | ICD-10-CM

## 2013-01-20 DIAGNOSIS — IMO0002 Reserved for concepts with insufficient information to code with codable children: Secondary | ICD-10-CM

## 2013-01-20 DIAGNOSIS — D72829 Elevated white blood cell count, unspecified: Secondary | ICD-10-CM

## 2013-01-20 LAB — GLUCOSE, CAPILLARY: Glucose-Capillary: 105 mg/dL — ABNORMAL HIGH (ref 70–99)

## 2013-01-20 LAB — BASIC METABOLIC PANEL
BUN: 14 mg/dL (ref 6–23)
Calcium: 9.7 mg/dL (ref 8.4–10.5)
Creatinine, Ser: 0.81 mg/dL (ref 0.50–1.10)
GFR calc non Af Amer: 73 mL/min — ABNORMAL LOW (ref >90)
Glucose, Bld: 100 mg/dL — ABNORMAL HIGH (ref 70–99)
Sodium: 138 mEq/L (ref 135–145)

## 2013-01-20 MED ORDER — INSULIN ASPART 100 UNIT/ML ~~LOC~~ SOLN: 0.0000 [IU] | Freq: Three times a day (TID) | SUBCUTANEOUS | Status: DC

## 2013-01-20 MED ORDER — MAGNESIUM CITRATE PO SOLN
1.0000 | Freq: Once | ORAL | Status: DC
  Filled 2013-01-20: qty 296

## 2013-01-20 MED ORDER — HYDROCODONE-ACETAMINOPHEN 5-325 MG PO TABS: 1.0000 | ORAL_TABLET | ORAL | Status: DC | PRN

## 2013-01-20 NOTE — Progress Notes (Signed)
Thank you for consult on Ms. [name]. Progress notes and PT evaluation reviewed. PT with OA and meniscal tear with knee pain limiting mobility. Would recommend SNF for further therapies if husband unable to provide assistance past discharge.

## 2013-01-20 NOTE — Discharge Summary (Signed)
Physician Discharge Summary  [name] ZOX:[id number] DOB: 01/20/1946 DOA: 01/17/2013  PCP: Ruthe Mannan, MD  Admit date: 01/17/2013 Discharge date: 01/20/2013  Recommendations for Outpatient Follow-up:  1. F/U with Dr. Despina Hick in 2 weeks. 2. 50% partial weight bearing with a walker, PT recommended at SNF.  Discharge Diagnoses:  Principal Problem:    Left knee pain secondary to posterior horn medial meniscus tear at the meniscal root with partial detachment Active Problems:    Hypertension    GERD (gastroesophageal reflux disease)    Diabetes mellitus    Leukocytosis, unspecified    Acute meniscal tear of left knee   Discharge Condition: Improved.  Diet recommendation: Carbohydrate-modified.  History of present illness:  [name] is an 67 y.o. female with a past medical history of diabetes who presented to the emergency room on 01/18/2013 with severe left knee pain, negative for acute fracture or other findings on pain radiography. Subsequent MRI revealed a meniscal tear. Seen by orthopedic surgeon with no urgent indication for surgery.  Hospital Course by problem:  Left knee pain secondary to acute meniscal tear  -Plain films negative for fracture or other acute findings.  -MRI with meniscal tear and stress reaction in the tibia.  -Orthopedics consultation performed by Dr. Despina Hick on 01/19/2013.  -ESR within normal limits and physical exam findings not consistent with septic joint.  -CIR declined admission, will need SNF for rehabilitation as she does not have care at home and is unable to ambulate.  Active Problems:  Hypokalemia  -Potassium replaced.  Hypertension  -Continue metoprolol, Altace, and Maxzide.  GERD (gastroesophageal reflux disease)  -Continue PPI therapy.  Diabetes mellitus  -Continue metformin. Controlled. Leukocytosis, unspecified  -Mild.  Medical Consultants:  Dr. Berton Lan, Orthopedics. Other Consultants:  PT: CIR versus home with 24  hour supervision.   Discharge Exam: Filed Vitals:   01/20/13 0634  BP: 132/86  Pulse: 57  Temp: 97.6 F (36.4 C)  Resp: 16   Filed Vitals:   01/19/13 0500 01/19/13 1320 01/19/13 2138 01/20/13 0634  BP: 124/58 107/42 131/55 132/86  Pulse: 64 67 62 57  Temp: 97.5 F (36.4 C) 97.2 F (36.2 C) 97.9 F (36.6 C) 97.6 F (36.4 C)  TempSrc: Oral  Oral Oral  Resp: 20 16 16 16   Height:      Weight:      SpO2: 99% 98% 98% 98%    Gen: NAD, Obese  Cardiovascular: RRR, No M/R/G  Respiratory: Lungs CTAB  Gastrointestinal: Abdomen soft, NT/ND, + BS  Extremities: Trace edema. Scar right knee, left knee tender to palpation with some soft tissue swelling inferior to knee, unchanged.  Discharge Instructions      Discharge Orders   Future Orders Complete By Expires     Call MD for:  severe uncontrolled pain  As directed     Diet Carb Modified  As directed     Increase activity slowly  As directed     Walk with assistance  As directed     Walker   As directed         Medication List    TAKE these medications       aspirin 81 MG tablet  Take 81 mg by mouth daily.     HYDROcodone-acetaminophen 5-325 MG per tablet  Commonly known as:  NORCO/VICODIN  Take 1-2 tablets by mouth every 4 (four) hours as needed.     metFORMIN 500 MG 24 hr tablet  Commonly known as:  GLUCOPHAGE-XR  Take 1 tablet (500 mg total) by mouth daily with breakfast.     metoprolol succinate 50 MG 24 hr tablet  Commonly known as:  TOPROL-XL  Take 50 mg by mouth daily. Take with or immediately following a meal.     omeprazole 10 MG capsule  Commonly known as:  PRILOSEC  Take 10 mg by mouth every other day. Every other day.     oxyCODONE-acetaminophen 5-325 MG per tablet  Commonly known as:  PERCOCET/ROXICET  Take 1 tablet by mouth every 6 (six) hours as needed for pain.     ramipril 5 MG capsule  Commonly known as:  ALTACE  Take 5 mg by mouth daily.     triamterene-hydrochlorothiazide 37.5-25 MG  per tablet  Commonly known as:  MAXZIDE-25  Take 1 tablet by mouth daily.       Follow-up Information   Schedule an appointment as soon as possible for a visit with Jacki Cones, MD.   Contact information:   [address] [place] [address] [phone]        The results of significant diagnostics from this hospitalization (including imaging, microbiology, ancillary and laboratory) are listed below for reference.    Significant Diagnostic Studies: Dg Femur Left  01/18/2013   *RADIOLOGY REPORT*  Clinical Data: Knee gave out while the patient was walking; anterior knee pain, radiating of the femur.  LEFT FEMUR - 2 VIEW  Comparison: Left knee radiographs performed 01/17/2013  Findings: The left femur appears intact.  There is no evidence of fracture or dislocation.  Cortical irregularity is noted along the articular surface of the patella.  Visualized joint spaces are preserved.  No significant knee joint effusion is identified.  Scattered soft tissue calcifications are noted along the proximal lower leg.  The left femoral head remains seated at the acetabulum.  No additional soft tissue abnormalities are characterized on radiograph.  IMPRESSION:  1.  No evidence of fracture or dislocation. 2.  Mild degenerative change noted along the articular surface of the patella.   Original Report Authenticated By: Tonia Ghent, M.D.   Mr Knee Left  Wo Contrast  01/18/2013   *RADIOLOGY REPORT*  Clinical Data:  Knee pain.  MRI OF THE LEFT KNEE WITHOUT CONTRAST  Technique:  Multiplanar, multisequence MR imaging of the left knee was performed.  No intravenous contrast was administered.  Comparison:  Left knee radiographs 01/18/2013.  FINDINGS: MENISCI Medial:  Posterior horn tear at the meniscal root with partial detachment and mild medial protrusion of the meniscus.  Type 2 signal changes in the anterior horn and midbody regions. Lateral:  Intact.  LIGAMENTS  Cruciates:  Intact. Collaterals:  Intact.  CARTILAGE Patellofemoral:  Advanced degenerative chondrosis with full or near full-thickness cartilage loss along the medial aspect of the patellar apex. Medial:  Moderate degenerative chondrosis. Lateral:  Mild to moderate degenerative chondrosis.  Joint:  A small joint effusion is noted.  A medial patellar plica is present. Popliteal Fossa:  Small Baker's cyst. Extensor Mechanism:  The patella retinacular structures are intact and the quadriceps and patellar tendons are intact. Bones: Marrow edema in the medial tibial spine posteriorly may be related to the meniscal root tear with associated stress-related change.  No stress fracture or osteochondral lesions.  IMPRESSION:  1.  Posterior horn medial meniscus tear at the meniscal root with partial detachment. 2.  Intact ligamentous structures. 3.  Tricompartmental degenerative changes. 4.  Edema like signal abnormality in the  medial tibial spine posteriorly likely stress-related process. 5.  Small joint effusion and small Baker's cyst.   Original Report Authenticated By: Rudie Meyer, M.D.   Dg Knee Complete 4 Views Left  01/17/2013   *RADIOLOGY REPORT*  Clinical Data: Knee gave out while the patient was walking; diffuse left knee pain.  LEFT KNEE - COMPLETE 4+ VIEW  Comparison: None.  Findings: There is no evidence of fracture or dislocation.  The joint spaces are preserved.  No significant degenerative change is seen; the patellofemoral joint is grossly unremarkable in appearance.  A fabella is noted.  Trace joint fluid remains within normal limits.  Scattered soft tissue calcifications are seen along the proximal lower leg.  IMPRESSION: No evidence of fracture or dislocation.   Original Report Authenticated By: Tonia Ghent, M.D.    Labs:  Basic Metabolic Panel:  Recent Labs Lab 01/18/13 0207 01/18/13 0725 01/20/13 0419  NA 139 139 138  K 3.6 3.3* 3.8  CL 101 103 103  CO2 25 27 27   GLUCOSE 113* 110*  100*  BUN 16 16 14   CREATININE 0.87 0.89 0.81  CALCIUM 10.1 9.5 9.7   GFR Estimated Creatinine Clearance: 79.6 ml/min (by C-G formula based on Cr of 0.81).  CBC:  Recent Labs Lab 01/18/13 0207 01/18/13 0725  WBC 11.8* 11.6*  NEUTROABS 6.7  --   HGB 14.2 12.7  HCT 42.0 38.1  MCV 86.1 87.0  PLT 279 258  CBG:  Recent Labs Lab 01/19/13 1221 01/19/13 1720 01/19/13 2133 01/20/13 0733 01/20/13 1143  GLUCAP 111* 138* 103* 94 105*   Thyroid function studies  Recent Labs  01/18/13 0725  TSH 3.454   Microbiology Recent Results (from the past 240 hour(s))  URINE CULTURE     Status: None   Collection Time    01/18/13  5:10 AM      Result Value Range Status   Specimen Description URINE, RANDOM   Final   Special Requests NONE   Final   Culture  Setup Time 01/18/2013 13:11   Final   Colony Count 50,000 COLONIES/ML   Final   Culture     Final   Value: Multiple bacterial morphotypes present, none predominant. Suggest appropriate recollection if clinically indicated.   Report Status 01/19/2013 FINAL   Final    Time coordinating discharge: 35 minutes with > 50% of the time devoted to diagnosis, management of condition, expected recovery period and need for orthopedic follow up.  Signed:    RAMA,CHRISTINA  Pager [phone] [facility] 01/20/2013, 1:19 PM

## 2013-01-20 NOTE — Progress Notes (Signed)
Physical Therapy Treatment Patient Details Name: [name] MRN: [id number] DOB: 1946/04/13 Today's Date: 01/20/2013 Time: 4098-1191 PT Time Calculation (min): 53 min  PT Assessment / Plan / Recommendation Comments on Treatment Session  Pt able to tolerate bed exercises and increased ambulation today, however continues to have increased pain with WB.  Also noted soft "puffy" (fluid filled like) at back of left knee.  RN notified.     Follow Up Recommendations  CIR;Supervision/Assistance - 24 hour     Does the patient have the potential to tolerate intense rehabilitation     Barriers to Discharge        Equipment Recommendations  None recommended by PT    Recommendations for Other Services OT consult  Frequency Min 5X/week   Plan Discharge plan remains appropriate    Precautions / Restrictions Precautions Precautions: Knee;Fall Precaution Comments: Pt with no order for maintaining KI, however maintained during session for pain control and stability.  Required Braces or Orthoses: Knee Immobilizer - Left Restrictions Weight Bearing Restrictions: Yes LLE Weight Bearing: Partial weight bearing LLE Partial Weight Bearing Percentage or Pounds: 50%   Pertinent Vitals/Pain 6/10 pain, ice packs applied, premedicated.     Mobility  Bed Mobility Bed Mobility: Supine to Sit Supine to Sit: 5: Supervision;HOB elevated;With rails Details for Bed Mobility Assistance: Pt able to use rails to self assist to EOB with cues for trying to use bed as much as possible to better simulate home environment.  Transfers Transfers: Sit to Stand;Stand to Sit Sit to Stand: 4: Min assist;With upper extremity assist;From bed;From chair/3-in-1 Stand to Sit: 4: Min assist;With upper extremity assist;With armrests;To chair/3-in-1 Details for Transfer Assistance: Performed x 2 to use 3in1 by bedside with mod cues for hand placement and LE management.  Ambulation/Gait Ambulation/Gait Assistance: 3: Mod  assist Ambulation Distance (Feet): 35 Feet Assistive device: Rolling walker Ambulation/Gait Assistance Details: Assist to steady and provide weight shift with max cues for sequencing/technique with RW, upright and relaxed posture and equal step lengths.  Gait Pattern: Step-to pattern;Decreased stride length;Antalgic;Trunk flexed Gait velocity: decreased Stairs: No Wheelchair Mobility Wheelchair Mobility: No    Exercises Total Joint Exercises Ankle Circles/Pumps: AROM;Both;20 reps Quad Sets: Strengthening;Left;10 reps Short Arc Quad: AROM;Strengthening;Left;10 reps Heel Slides: AAROM;Left;10 reps Hip ABduction/ADduction: AROM;Left;10 reps Straight Leg Raises: AAROM;Left;10 reps   PT Diagnosis:    PT Problem List:   PT Treatment Interventions:     PT Goals Acute Rehab PT Goals PT Goal Formulation: With patient/family Time For Goal Achievement: 01/26/13 Potential to Achieve Goals: Good Pt will go Supine/Side to Sit: with modified independence;with HOB 0 degrees PT Goal: Supine/Side to Sit - Progress: Updated due to goal met Pt will go Sit to Stand: with supervision PT Goal: Sit to Stand - Progress: Progressing toward goal Pt will go Stand to Sit: with supervision PT Goal: Stand to Sit - Progress: Progressing toward goal Pt will Ambulate: 51 - 150 feet;with min assist;with least restrictive assistive device PT Goal: Ambulate - Progress: Progressing toward goal Pt will Perform Home Exercise Program: with supervision, verbal cues required/provided PT Goal: Perform Home Exercise Program - Progress: Progressing toward goal  Visit Information  Last PT Received On: 01/20/13 Assistance Needed: +2    Subjective Data  Subjective: I did a little better today.  Patient Stated Goal: to get some rehab before going home.    Cognition  Cognition Arousal/Alertness: Awake/alert Behavior During Therapy: WFL for tasks assessed/performed Overall Cognitive Status: Within Functional Limits  for tasks  assessed    Balance     End of Session PT - End of Session Equipment Utilized During Treatment: Gait belt;Left knee immobilizer Activity Tolerance: Patient limited by pain Patient left: in chair;with call bell/phone within reach Nurse Communication: Mobility status   GP     Vista Deck 01/20/2013, 10:13 AM

## 2013-01-20 NOTE — Progress Notes (Signed)
Clinical Social Work Department BRIEF PSYCHOSOCIAL ASSESSMENT 01/20/2013  Patient:  [name], [name]     Account Number:  [id number]     Admit date:  01/17/2013  Clinical Social Worker:  Candie Chroman  Date/Time:  01/20/2013 01:05 PM  Referred by:  Physician  Date Referred:  01/20/2013 Referred for  SNF Placement   Other Referral:   Interview type:   Other interview type:    PSYCHOSOCIAL DATA Living Status:  HUSBAND Admitted from facility:   Level of care:   Primary support name:  [name] Primary support relationship to patient:  SPOUSE Degree of support available:   limited due to his health issues    CURRENT CONCERNS Current Concerns  Post-Acute Placement   Other Concerns:    SOCIAL WORK ASSESSMENT / PLAN Pt is a 67 yr old female living at home prior to hospitalization. CSW met with pt to assist with d/c planning. Pt was referred to CIR but they are unable to assist .ST SNF placement is needed. SNF search has been initiated and pt has chosen [facility] for rehab. CSW will assist with d/c planning to SNF.   Assessment/plan status:  Psychosocial Support/Ongoing Assessment of Needs Other assessment/ plan:   Information/referral to community resources:   SNF list provided.    PATIENT'S/FAMILY'S RESPONSE TO PLAN OF CARE: Pt is looking forward to having rehab at [facility].   Cori Razor LCSW [phone]

## 2013-01-20 NOTE — Progress Notes (Signed)
[facility] PROGRESS NOTE  [name] UJW:[id number] DOB: 1946/05/31 DOA: 01/17/2013 PCP: Ruthe Mannan, MD  Brief narrative: [name] is an 67 y.o. female with a past medical history of diabetes who presented to the emergency room on 01/18/2013 with severe left knee pain, negative for acute fracture or other findings on pain radiography. Subsequent MRI revealed a meniscal tear. Seen by orthopedic surgeon with no urgent indication for surgery.  Assessment/Plan: Principal Problem:   Left knee pain secondary to acute meniscal tear -Plain films negative for fracture or other acute findings. -MRI with meniscal tear and stress reaction in the tibia. -Orthopedics consultation performed by Dr. Despina Hick on 01/19/2013.   -ESR within normal limits and physical exam findings not consistent with septic joint. -CIR declined admission, we'll need SNF for rehabilitation as she does not have care at home and is unable to ambulate. Active Problems:   Hypokalemia -Potassium replaced.   Hypertension -Continue metoprolol, Altace, and Maxzide.   GERD (gastroesophageal reflux disease) -Continue PPI therapy.   Diabetes mellitus -Continue metformin.   Leukocytosis, unspecified -Mild.  Code Status: Full Family Communication: None present. Disposition Plan: ? CIR.   Medical Consultants:  Dr. Berton Lan, Orthopedics.  Other Consultants:  PT: CIR versus home with 24 hour supervision.  Anti-infectives:  None.  HPI/Subjective: [name] continues to complain of left-sided knee pain and inability to ambulate. Appetite is good. No nausea or vomiting.  Objective: Filed Vitals:   01/19/13 0500 01/19/13 1320 01/19/13 2138 01/20/13 0634  BP: 124/58 107/42 131/55 132/86  Pulse: 64 67 62 57  Temp: 97.5 F (36.4 C) 97.2 F (36.2 C) 97.9 F (36.6 C) 97.6 F (36.4 C)  TempSrc: Oral  Oral Oral  Resp: 20 16 16 16   Height:      Weight:      SpO2: 99% 98% 98% 98%    Intake/Output  Summary (Last 24 hours) at 01/20/13 0815 Last data filed at 01/20/13 5621  Gross per 24 hour  Intake 3272.5 ml  Output   1500 ml  Net 1772.5 ml    Exam: Gen:  NAD, Obese Cardiovascular:  RRR, No M/R/G Respiratory:  Lungs CTAB Gastrointestinal:  Abdomen soft, NT/ND, + BS Extremities:  Trace edema.  Scar right knee, left knee tender to palpation with some soft tissue swelling inferior to knee, unchanged.  Data Reviewed: Basic Metabolic Panel:  Recent Labs Lab 01/18/13 0207 01/18/13 0725 01/20/13 0419  NA 139 139 138  K 3.6 3.3* 3.8  CL 101 103 103  CO2 25 27 27   GLUCOSE 113* 110* 100*  BUN 16 16 14   CREATININE 0.87 0.89 0.81  CALCIUM 10.1 9.5 9.7   GFR Estimated Creatinine Clearance: 79.6 ml/min (by C-G formula based on Cr of 0.81).  CBC:  Recent Labs Lab 01/18/13 0207 01/18/13 0725  WBC 11.8* 11.6*  NEUTROABS 6.7  --   HGB 14.2 12.7  HCT 42.0 38.1  MCV 86.1 87.0  PLT 279 258   CBG:  Recent Labs Lab 01/19/13 0733 01/19/13 1221 01/19/13 1720 01/19/13 2133 01/20/13 0733  GLUCAP 101* 111* 138* 103* 94   Microbiology Recent Results (from the past 240 hour(s))  URINE CULTURE     Status: None   Collection Time    01/18/13  5:10 AM      Result Value Range Status   Specimen Description URINE, RANDOM   Final   Special Requests NONE   Final   Culture  Setup Time 01/18/2013 13:11  Final   Colony Count 50,000 COLONIES/ML   Final   Culture     Final   Value: Multiple bacterial morphotypes present, none predominant. Suggest appropriate recollection if clinically indicated.   Report Status 01/19/2013 FINAL   Final     Procedures and Diagnostic Studies: Dg Femur Left  01/18/2013   *RADIOLOGY REPORT*  Clinical Data: Knee gave out while the patient was walking; anterior knee pain, radiating of the femur.  LEFT FEMUR - 2 VIEW  Comparison: Left knee radiographs performed 01/17/2013  Findings: The left femur appears intact.  There is no evidence of fracture or  dislocation.  Cortical irregularity is noted along the articular surface of the patella.  Visualized joint spaces are preserved.  No significant knee joint effusion is identified.  Scattered soft tissue calcifications are noted along the proximal lower leg.  The left femoral head remains seated at the acetabulum.  No additional soft tissue abnormalities are characterized on radiograph.  IMPRESSION:  1.  No evidence of fracture or dislocation. 2.  Mild degenerative change noted along the articular surface of the patella.   Original Report Authenticated By: Tonia Ghent, M.D.   Dg Knee Complete 4 Views Left  01/17/2013   *RADIOLOGY REPORT*  Clinical Data: Knee gave out while the patient was walking; diffuse left knee pain.  LEFT KNEE - COMPLETE 4+ VIEW  Comparison: None.  Findings: There is no evidence of fracture or dislocation.  The joint spaces are preserved.  No significant degenerative change is seen; the patellofemoral joint is grossly unremarkable in appearance.  A fabella is noted.  Trace joint fluid remains within normal limits.  Scattered soft tissue calcifications are seen along the proximal lower leg.  IMPRESSION: No evidence of fracture or dislocation.   Original Report Authenticated By: Tonia Ghent, M.D.   Mr Knee Left  Wo Contrast  01/18/2013   *RADIOLOGY REPORT*  Clinical Data:  Knee pain.  MRI OF THE LEFT KNEE WITHOUT CONTRAST  Technique:  Multiplanar, multisequence MR imaging of the left knee was performed.  No intravenous contrast was administered.  Comparison:  Left knee radiographs 01/18/2013.  FINDINGS: MENISCI Medial:  Posterior horn tear at the meniscal root with partial detachment and mild medial protrusion of the meniscus.  Type 2 signal changes in the anterior horn and midbody regions. Lateral:  Intact.  LIGAMENTS Cruciates:  Intact. Collaterals:  Intact.  CARTILAGE Patellofemoral:  Advanced degenerative chondrosis with full or near full-thickness cartilage loss along the medial  aspect of the patellar apex. Medial:  Moderate degenerative chondrosis. Lateral:  Mild to moderate degenerative chondrosis.  Joint:  A small joint effusion is noted.  A medial patellar plica is present. Popliteal Fossa:  Small Baker's cyst. Extensor Mechanism:  The patella retinacular structures are intact and the quadriceps and patellar tendons are intact. Bones: Marrow edema in the medial tibial spine posteriorly may be related to the meniscal root tear with associated stress-related change.  No stress fracture or osteochondral lesions.  IMPRESSION:  1.  Posterior horn medial meniscus tear at the meniscal root with partial detachment. 2.  Intact ligamentous structures. 3.  Tricompartmental degenerative changes. 4.  Edema like signal abnormality in the medial tibial spine posteriorly likely stress-related process. 5.  Small joint effusion and small Baker's cyst.   Original Report Authenticated By: Rudie Meyer, M.D.    Scheduled Meds: . aspirin  81 mg Oral Daily  . enoxaparin (LOVENOX) injection  55 mg Subcutaneous Q24H  . insulin aspart  0-9 Units  Subcutaneous TID WC  . metFORMIN  500 mg Oral Q breakfast  . metoprolol succinate  50 mg Oral Daily  . pantoprazole  40 mg Oral Daily  . ramipril  5 mg Oral Daily  . triamterene-hydrochlorothiazide  1 tablet Oral Daily   Continuous Infusions: . sodium chloride 75 mL/hr at 01/19/13 2305    Time spent: 25 minutes.   LOS: 3 days   Anjulie Dipierro  [facility] Pager [phone].  If 8PM-8AM, please contact night-coverage at [email or web address], password [facility] 01/20/2013, 8:15 AM

## 2013-01-20 NOTE — Progress Notes (Signed)
Pt ready for d/c. No change in pt status since AM assessment. PIV removed, WNL. Pt d/c'd to [facility] via husband. Report given to D. Wilson RN @ [address] Eugene Garnet RN

## 2013-01-20 NOTE — Progress Notes (Signed)
Clinical Social Work Department CLINICAL SOCIAL WORK PLACEMENT NOTE 01/20/2013  Patient:  [name], [name]  Account Number:  [id number] Admit date:  01/17/2013  Clinical Social Worker:  Cori Razor, LCSW  Date/time:  01/20/2013 02:24 PM  Clinical Social Work is seeking post-discharge placement for this patient at the following level of care:   SKILLED NURSING   (*CSW will update this form in Epic as items are completed)   01/20/2013  Patient/family provided with [facility] Department of Clinical Social Work's list of facilities offering this level of care within the geographic area requested by the patient (or if unable, by the patient's family).  01/20/2013  Patient/family informed of their freedom to choose among providers that offer the needed level of care, that participate in Medicare, Medicaid or managed care program needed by the patient, have an available bed and are willing to accept the patient.    Patient/family informed of [facility]' ownership interest in [facility], as well as of the fact that they are under no obligation to receive care at this facility.  PASARR submitted to EDS on 01/20/2013 PASARR number received from EDS on 01/20/2013  FL2 transmitted to all facilities in geographic area requested by pt/family on  01/20/2013 FL2 transmitted to all facilities within larger geographic area on   Patient informed that his/her managed care company has contracts with or will negotiate with  certain facilities, including the following:     Patient/family informed of bed offers received:  01/20/2013 Patient chooses bed at [facility] Physician recommends and patient chooses bed at    Patient to be transferred to [facility] on  01/20/2013 Patient to be transferred to facility by FAMILY  The following physician request were entered in Epic:   Additional Comments:  Cori Razor LCSW [phone]

## 2013-01-22 DIAGNOSIS — K219 Gastro-esophageal reflux disease without esophagitis: Secondary | ICD-10-CM

## 2013-01-22 DIAGNOSIS — I10 Essential (primary) hypertension: Secondary | ICD-10-CM

## 2013-01-22 DIAGNOSIS — E119 Type 2 diabetes mellitus without complications: Secondary | ICD-10-CM

## 2013-01-22 DIAGNOSIS — IMO0002 Reserved for concepts with insufficient information to code with codable children: Secondary | ICD-10-CM

## 2013-02-02 ENCOUNTER — Encounter (HOSPITAL_COMMUNITY): Payer: Self-pay | Admitting: Pharmacy Technician

## 2013-02-02 ENCOUNTER — Other Ambulatory Visit (HOSPITAL_COMMUNITY): Payer: Self-pay | Admitting: *Deleted

## 2013-02-10 NOTE — H&P (Signed)
[name] DOB: [birth year]  Chief Complaint: left knee pain  History of Present Illness The patient is a 67 year old female who presents with knee complaints. The patient reports left knee symptoms including: pain, swelling, giving way and weakness which began 1 year ago following a specific injury. The injury occurred due to a fall. The patient describes the severity of the symptoms as moderate in severity.The patient feels that the symptoms are improving. The patient has the current diagnosis of anterior knee pain. Past treatment for this problem has included crutches (partial weight bearing), use of a walker and non-opioid analgesics. Symptoms are reported to be located in the left anterior knee, left lateral knee, left medial knee and left posterior knee and include anterior knee pain, medial knee pain, lateral knee pain, swelling, instability, difficulty ambulating and audible pop at the time of injury. The patient describes their pain as sharp and throbbing. Onset of symptoms was sudden. Current treatment includes application of ice, restricted activity, use of a walker, non-opioid analgesics and physical therapy. Symptoms present at the patient's previous evaluation included knee pain, swelling, an audible pop at the time of injury, instability and difficulty bearing weight. The patient states that this is not a Financial risk analyst case. The patient does not have a lawsuit pending in regards to this matter. She was seen in the ER at [facility]. She had xray and MRI. She was sent from the hospital to [facility] for care and rehab. She had some posterior and bil lateral pain. She states when she fell, the leg gave away and she felt something pop. MRI revealed a tear of the posterior horn of the medial meniscus.  Allergies No Known Drug Allergies.    Family History Cancer. mother, father and brother Cerebrovascular Accident. grandfather fathers side Depression. mother and  brother Diabetes Mellitus. father and child Heart Disease. father and grandfather fathers side Hypertension. father and grandfather fathers side Osteoporosis. grandmother mothers side Rheumatoid Arthritis. father Severe allergy. child   Social History Alcohol use. current drinker; drinks wine; only occasionally per week Children. 2 Current work status. retired Exercise. Exercises never Illicit drug use. no Living situation. live with spouse Marital status. married Number of flights of stairs before winded. 1 Tobacco use. former smoker; smoke(d) 1 pack(s) per day   Medication History MetFORMIN HCl ER (500MG  Tablet ER 24HR, Oral) Active. Metoprolol Succinate ER (50MG  Tablet ER 24HR, Oral) Active. Ramipril (5MG  Capsule, Oral) Active. Triamterene-HCTZ (37.5-25MG  Tablet, Oral) Active. Aspirin (81MG  Tablet, 1 (one) Oral) Active. NexIUM (5MG  Packet, Oral) Active. (generic [facility] brand)  Past Surgical History Appendectomy Arthroscopy of Knee. right Inguinal Hernia Repair. open: right Tonsillectomy Total Knee Replacement. right Tubal Ligation   Past Medical History Anemia Anxiety Disorder Chronic Pain Depression Diabetes Mellitus, Type II Gastroesophageal Reflux Disease Hypertension Peripheral Neuropathy Rheumatoid Arthritis   Review of Systems General:Present- Fatigue, Feeling sick and Weight Gain. Not Present- Chills, Fever, Night Sweats, Appetite Loss and Weight Loss. Skin:Not Present- Itching, Rash, Skin Color Changes, Ulcer, Psoriasis and Change in Hair or Nails. HEENT:Present- Ringing in the Ears. Not Present- Sensitivity to light, Hearing problems and Nose Bleed. Neck:Not Present- Swollen Glands and Neck Mass. Respiratory:Present- Snoring and Dyspnea. Not Present- Chronic Cough and Bloody sputum. Cardiovascular:Present- Shortness of Breath, Chest Pain, Swelling of Extremities, Leg Cramps and Palpitations. Gastrointestinal:Present-  Heartburn. Not Present- Bloody Stool, Abdominal Pain, Vomiting, Nausea and Incontinence of Stool. Female Genitourinary:Present- Frequency. Not Present- Blood in Urine, Menstrual Irregularities, Incontinence and Nocturia. Musculoskeletal:Present- Muscle  Weakness, Muscle Pain, Joint Stiffness, Joint Swelling, Joint Pain and Back Pain. Neurological:Present- Tingling, Numbness and Dizziness. Not Present- Burning, Tremor and Headaches. Psychiatric:Present- Anxiety, Depression and Memory Loss. Endocrine:Not Present- Cold Intolerance, Heat Intolerance, Excessive hunger and Excessive Thirst. Hematology:Not Present- Abnormal Bleeding, Anemia, Blood Clots and Easy Bruising.   Vitals Weight: 241 lb Height: 63 in Body Surface Area: 2.2 m Body Mass Index: 42.69 kg/m BP: 128/70 (Sitting, Left Arm, Standard)    Objective  Physical exam today of her right total knee shows it to be functioning well. A little tight in flexion. The wound looks good. No complications. The left knee shows pain and swelling with motion. Her motion is limited in flexion because of her pain. Her left knee is somewhat unstable. The collateral ligaments and cruciates appear to be intact. Calf is soft and nontender. No phlebitis. Good circulation. LUNGS: On auscultating her lungs, her lungs are clear. HEART: Normal sinus rhythm and no murmur. ORAL CAVITY: Negative. NECK: Negative.    Assessment & Plan Acute Medial Meniscal Tear (836.0) She needs a left knee arthroscopy with medial menisectomy. This will be an outpatient procedure. Risks and benefits of the surgery were discussed with the patient by Dr. Darrelyn Hillock.      Dimitri Ped, PA-C

## 2013-02-11 ENCOUNTER — Encounter (HOSPITAL_COMMUNITY): Payer: Self-pay | Admitting: Anesthesiology

## 2013-02-11 ENCOUNTER — Encounter (HOSPITAL_COMMUNITY)
Admission: RE | Disposition: A | Discharge status: Home / Self Care | Payer: Self-pay | Source: Ambulatory Visit | Attending: Orthopedic Surgery

## 2013-02-11 ENCOUNTER — Ambulatory Visit (HOSPITAL_COMMUNITY): Payer: Medicare Other | Admitting: Anesthesiology

## 2013-02-11 ENCOUNTER — Encounter (HOSPITAL_COMMUNITY): Payer: Self-pay

## 2013-02-11 ENCOUNTER — Ambulatory Visit (HOSPITAL_COMMUNITY): Payer: Medicare Other

## 2013-02-11 ENCOUNTER — Ambulatory Visit (HOSPITAL_COMMUNITY)
Admission: RE | Admit: 2013-02-11 | Discharge: 2013-02-11 | Disposition: A | Discharge status: Home / Self Care | Payer: Medicare Other | Source: Ambulatory Visit | Attending: Orthopedic Surgery | Admitting: Orthopedic Surgery

## 2013-02-11 DIAGNOSIS — M1710 Unilateral primary osteoarthritis, unspecified knee: Secondary | ICD-10-CM | POA: Insufficient documentation

## 2013-02-11 DIAGNOSIS — M659 Unspecified synovitis and tenosynovitis, unspecified site: Secondary | ICD-10-CM | POA: Insufficient documentation

## 2013-02-11 DIAGNOSIS — Z96659 Presence of unspecified artificial knee joint: Secondary | ICD-10-CM | POA: Insufficient documentation

## 2013-02-11 DIAGNOSIS — E119 Type 2 diabetes mellitus without complications: Secondary | ICD-10-CM | POA: Insufficient documentation

## 2013-02-11 DIAGNOSIS — M23305 Other meniscus derangements, unspecified medial meniscus, unspecified knee: Secondary | ICD-10-CM | POA: Insufficient documentation

## 2013-02-11 DIAGNOSIS — M1712 Unilateral primary osteoarthritis, left knee: Secondary | ICD-10-CM | POA: Diagnosis present

## 2013-02-11 DIAGNOSIS — I10 Essential (primary) hypertension: Secondary | ICD-10-CM | POA: Insufficient documentation

## 2013-02-11 DIAGNOSIS — M23302 Other meniscus derangements, unspecified lateral meniscus, unspecified knee: Secondary | ICD-10-CM | POA: Insufficient documentation

## 2013-02-11 DIAGNOSIS — K219 Gastro-esophageal reflux disease without esophagitis: Secondary | ICD-10-CM | POA: Insufficient documentation

## 2013-02-11 HISTORY — PX: KNEE ARTHROSCOPY WITH MEDIAL MENISECTOMY: SHX5651

## 2013-02-11 LAB — GLUCOSE, CAPILLARY

## 2013-02-11 LAB — SURGICAL PCR SCREEN
MRSA, PCR: NEGATIVE
Staphylococcus aureus: NEGATIVE

## 2013-02-11 SURGERY — ARTHROSCOPY, KNEE, WITH MEDIAL MENISCECTOMY
Anesthesia: General | Site: Knee | Laterality: Left | Wound class: Clean

## 2013-02-11 MED ORDER — ONDANSETRON HCL 4 MG/2ML IJ SOLN
INTRAMUSCULAR | Status: DC | PRN
  Administered 2013-02-11: 2 mg via INTRAVENOUS

## 2013-02-11 MED ORDER — CHLORHEXIDINE GLUCONATE 4 % EX LIQD: 60.0000 mL | Freq: Once | CUTANEOUS | Status: DC

## 2013-02-11 MED ORDER — MIDAZOLAM HCL 5 MG/5ML IJ SOLN
INTRAMUSCULAR | Status: DC | PRN
  Administered 2013-02-11: 1 mg via INTRAVENOUS

## 2013-02-11 MED ORDER — ENOXAPARIN SODIUM 40 MG/0.4ML ~~LOC~~ SOLN: 40.0000 mg | SUBCUTANEOUS | Status: DC

## 2013-02-11 MED ORDER — PROPOFOL 10 MG/ML IV BOLUS
INTRAVENOUS | Status: DC | PRN
  Administered 2013-02-11: 150 mg via INTRAVENOUS

## 2013-02-11 MED ORDER — BACITRACIN ZINC 500 UNIT/GM EX OINT
TOPICAL_OINTMENT | CUTANEOUS | Status: AC
  Filled 2013-02-11: qty 15

## 2013-02-11 MED ORDER — HYDROMORPHONE HCL 2 MG PO TABS
4.0000 mg | ORAL_TABLET | ORAL | Status: DC | PRN
  Administered 2013-02-11: 4 mg via ORAL
  Filled 2013-02-11: qty 2

## 2013-02-11 MED ORDER — FENTANYL CITRATE 0.05 MG/ML IJ SOLN
INTRAMUSCULAR | Status: AC
  Filled 2013-02-11: qty 2

## 2013-02-11 MED ORDER — HYDROMORPHONE HCL 4 MG PO TABS: 4.0000 mg | ORAL_TABLET | ORAL | Status: DC | PRN

## 2013-02-11 MED ORDER — LACTATED RINGERS IV SOLN: INTRAVENOUS | Status: DC

## 2013-02-11 MED ORDER — BACITRACIN ZINC 500 UNIT/GM EX OINT
TOPICAL_OINTMENT | CUTANEOUS | Status: DC | PRN
  Administered 2013-02-11: 1 via TOPICAL

## 2013-02-11 MED ORDER — CEFAZOLIN SODIUM-DEXTROSE 2-3 GM-% IV SOLR
2.0000 g | INTRAVENOUS | Status: AC
  Administered 2013-02-11: 2 g via INTRAVENOUS

## 2013-02-11 MED ORDER — PROMETHAZINE HCL 25 MG/ML IJ SOLN: 6.2500 mg | INTRAMUSCULAR | Status: DC | PRN

## 2013-02-11 MED ORDER — LACTATED RINGERS IR SOLN
Status: DC | PRN
  Administered 2013-02-11: 6000 mL

## 2013-02-11 MED ORDER — FENTANYL CITRATE 0.05 MG/ML IJ SOLN
25.0000 ug | INTRAMUSCULAR | Status: DC | PRN
  Administered 2013-02-11 (×4): 25 ug via INTRAVENOUS
  Administered 2013-02-11: 50 ug via INTRAVENOUS

## 2013-02-11 MED ORDER — BUPIVACAINE-EPINEPHRINE 0.25% -1:200000 IJ SOLN
INTRAMUSCULAR | Status: DC | PRN
  Administered 2013-02-11: 20 mL

## 2013-02-11 MED ORDER — LIDOCAINE HCL (CARDIAC) 20 MG/ML IV SOLN
INTRAVENOUS | Status: DC | PRN
  Administered 2013-02-11: 30 mg via INTRAVENOUS

## 2013-02-11 MED ORDER — LACTATED RINGERS IV SOLN
INTRAVENOUS | Status: DC
  Administered 2013-02-11: 1000 mL via INTRAVENOUS

## 2013-02-11 MED ORDER — BUPIVACAINE-EPINEPHRINE PF 0.25-1:200000 % IJ SOLN
INTRAMUSCULAR | Status: AC
  Filled 2013-02-11: qty 30

## 2013-02-11 MED ORDER — LACTATED RINGERS IV SOLN
INTRAVENOUS | Status: DC | PRN
  Administered 2013-02-11 (×2): via INTRAVENOUS

## 2013-02-11 MED ORDER — MUPIROCIN 2 % EX OINT
TOPICAL_OINTMENT | Freq: Once | CUTANEOUS | Status: DC
  Filled 2013-02-11: qty 22

## 2013-02-11 MED ORDER — FENTANYL CITRATE 0.05 MG/ML IJ SOLN
INTRAMUSCULAR | Status: DC | PRN
  Administered 2013-02-11 (×3): 50 ug via INTRAVENOUS

## 2013-02-11 SURGICAL SUPPLY — 33 items
BANDAGE ELASTIC 4 VELCRO ST LF (GAUZE/BANDAGES/DRESSINGS) ×1 IMPLANT
BANDAGE ELASTIC 6 VELCRO ST LF (GAUZE/BANDAGES/DRESSINGS) ×1 IMPLANT
BANDAGE GAUZE ELAST BULKY 4 IN (GAUZE/BANDAGES/DRESSINGS) ×1 IMPLANT
BLADE GREAT WHITE 4.2 (BLADE) ×2 IMPLANT
BNDG COHESIVE 6X5 TAN STRL LF (GAUZE/BANDAGES/DRESSINGS) ×2 IMPLANT
CLOTH BEACON ORANGE TIMEOUT ST (SAFETY) ×2 IMPLANT
DRAPE LG THREE QUARTER DISP (DRAPES) ×2 IMPLANT
DRAPE U-SHAPE 47X51 STRL (DRAPES) ×1 IMPLANT
DRSG ADAPTIC 3X8 NADH LF (GAUZE/BANDAGES/DRESSINGS) ×1 IMPLANT
DRSG PAD ABDOMINAL 8X10 ST (GAUZE/BANDAGES/DRESSINGS) ×3 IMPLANT
DURAPREP 26ML APPLICATOR (WOUND CARE) ×2 IMPLANT
GLOVE BIOGEL PI IND STRL 8 (GLOVE) ×1 IMPLANT
GLOVE BIOGEL PI IND STRL 8.5 (GLOVE) ×1 IMPLANT
GLOVE BIOGEL PI INDICATOR 8 (GLOVE) ×1
GLOVE BIOGEL PI INDICATOR 8.5 (GLOVE) ×1
GLOVE ECLIPSE 8.0 STRL XLNG CF (GLOVE) ×6 IMPLANT
GOWN PREVENTION PLUS LG XLONG (DISPOSABLE) ×4 IMPLANT
GOWN PREVENTION PLUS XLARGE (GOWN DISPOSABLE) ×2 IMPLANT
GOWN STRL NON-REIN LRG LVL3 (GOWN DISPOSABLE) ×2 IMPLANT
GOWN STRL REIN XL XLG (GOWN DISPOSABLE) ×4 IMPLANT
MANIFOLD NEPTUNE II (INSTRUMENTS) ×2 IMPLANT
PACK ARTHROSCOPY WL (CUSTOM PROCEDURE TRAY) ×2 IMPLANT
PACK ICE MAXI GEL EZY WRAP (MISCELLANEOUS) ×2 IMPLANT
PAD MASON LEG HOLDER (PIN) ×2 IMPLANT
SET ARTHROSCOPY TUBING (MISCELLANEOUS) ×2
SET ARTHROSCOPY TUBING LN (MISCELLANEOUS) ×1 IMPLANT
SPONGE GAUZE 4X4 12PLY (GAUZE/BANDAGES/DRESSINGS) ×1 IMPLANT
SUT ETHILON 3 0 PS 1 (SUTURE) ×3 IMPLANT
SYR 20CC LL (SYRINGE) ×1 IMPLANT
TOWEL OR 17X26 10 PK STRL BLUE (TOWEL DISPOSABLE) ×6 IMPLANT
TUBING CONNECTING 10 (TUBING) ×2 IMPLANT
WAND 90 DEG TURBOVAC W/CORD (SURGICAL WAND) ×1 IMPLANT
WRAP KNEE MAXI GEL POST OP (GAUZE/BANDAGES/DRESSINGS) ×5 IMPLANT

## 2013-02-11 NOTE — Op Note (Signed)
[name], [name]               ACCOUNT NO.:  [id number]  MEDICAL RECORD NO.:  [id number]  LOCATION:  [facility]                         FACILITY:  [facility]  PHYSICIAN:  Georges Lynch. Halo Shevlin, M.D.DATE OF BIRTH:  07-02-1946  DATE OF PROCEDURE:  02/11/2013 DATE OF DISCHARGE:  02/11/2013                              OPERATIVE REPORT   PREOPERATIVE DIAGNOSES: 1. SEVERE DEGENERATIVE ARTHRITIS, LEFT KNEE. 2. DEGENERATIVE TEARS OF THE LATERAL MEDIAL MENISCI, LEFT KNEE. 3. SEVERE CHRONIC SYNOVITIS, LEFT KNEE.  POSTOPERATIVE DIAGNOSES: 1. SEVERE DEGENERATIVE ARTHRITIS, LEFT KNEE. 2. DEGENERATIVE TEARS OF THE LATERAL MEDIAL MENISCI, LEFT KNEE. 3. SEVERE CHRONIC SYNOVITIS, LEFT KNEE.  PROCEDURE: 1. DIAGNOSTIC ARTHROSCOPY, LEFT KNEE. 2. SYNOVECTOMY, LEFT KNEE.  SURGEON:  Georges Lynch. Zayquan Bogard, M.D.  ASSISTANT:  The OR tech.  DESCRIPTION OF PROCEDURE:  Under general anesthesia, the patient's left leg in a knee holder, routine orthopedic prep and draping of the left lower extremity was carried out.  Note at this time appropriate time-out was carried out.  I also marked the appropriate left leg in the holding area.  A small punctate incision was made in the suprapatellar pouch. Inflow cannula was inserted and knee was distended with saline.  Another small punctate incision was made in the anterolateral joint.  The arthroscope was entered from lateral approach and a diagnostic arthroscopy was carried out.  Note, immediately upon going into the knee, her knee really was collapsed, there was really not much left to the joint and I elected just to do a synovectomy.  I did not feel that there was any other thing that we could do to help her at this point. The menisci were degenerated as well as the chondral surfaces of the bone were all degenerated in nature.  The ACL was really intact and PCL. She has had a large amount of arthritic changes and synovitis.  I did a clean out on the knee as best as I  could.  She is eventually going to need a total knee arthroplasty.  There was no other real issues other than the severe osteoarthritis and the synovitis.  After the procedure, I thoroughly irrigated out the knee and then injected 30 mL of 0.25% Marcaine with epinephrine in the joint.  All 3 punctate incisions were closed with 3-0 nylon suture.  Sterile dressing was applied.  Postop, I am going to start her on Lovenox 40 mg subcu here in the a.m. for 1 week ago and then we will put her on aspirin.  I am using Lovenox mainly because of her body weight and risk of clots.  She will be on Dilaudid 2 mg by mouth every 3-4 hours p.r.n. for pain.  She will be on a walker, partial weightbearing as tolerated.          ______________________________ Georges Lynch. Darrelyn Hillock, M.D.     RAG/MEDQ  D:  02/11/2013  T:  02/11/2013  Job:  [id number]

## 2013-02-11 NOTE — Transfer of Care (Signed)
Immediate Anesthesia Transfer of Care Note  Patient: [name]  Procedure(s) Performed: Procedure(s): LEFT KNEE ARTHROSCOPY WITH MEDIAL MENISECTOMY (Left)  Patient Location: PACU  Anesthesia Type:General  Level of Consciousness: awake, sedated and patient cooperative  Airway & Oxygen Therapy: Patient Spontanous Breathing and Patient connected to face mask oxygen  Post-op Assessment: Report given to PACU RN and Post -op Vital signs reviewed and stable  Post vital signs: Reviewed and stable  Complications: No apparent anesthesia complications

## 2013-02-11 NOTE — Interval H&P Note (Signed)
History and Physical Interval Note:  02/11/2013 10:55 AM  [name]  has presented today for surgery, with the diagnosis of left knee medial menical tear  The various methods of treatment have been discussed with the patient and family. After consideration of risks, benefits and other options for treatment, the patient has consented to  Procedure(s): LEFT KNEE ARTHROSCOPY WITH MEDIAL MENISECTOMY (Left) as a surgical intervention .  The patient's history has been reviewed, patient examined, no change in status, stable for surgery.  I have reviewed the patient's chart and labs.  Questions were answered to the patient's satisfaction.     Grayling Schranz A

## 2013-02-11 NOTE — Brief Op Note (Signed)
02/11/2013  12:08 PM  PATIENT:  [name]  67 y.o. female  PRE-OPERATIVE DIAGNOSIS:  left knee medial menical tear,severe Osteoarthritis and synovitis  POST-OPERATIVE DIAGNOSIS:  left knee medial menical tear;severe Osteoarthritis and Severe Synovitis.  PROCEDURE:  Diagnostic Arthroscopy and Synovectomy Left Knee SURGEON:  Surgeon(s) and Role:    * Jacki Cones, MD - Primary    ASSISTANTS: OR Tech   ANESTHESIA:   general  EBL:     BLOOD ADMINISTERED:none  DRAINS: none   LOCAL MEDICATIONS USED:  MARCAINE  0.25% marcaine with Epinephrine,30cc   SPECIMEN:  No Specimen  DISPOSITION OF SPECIMEN:  N/A  COUNTS:  YES  TOURNIQUET:  * No tourniquets in log *  DICTATION: .Other Dictation: Dictation Number [id number]  PLAN OF CARE: Discharge to home after PACU  PATIENT DISPOSITION:  PACU - hemodynamically stable.   Delay start of Pharmacological VTE agent (>24hrs) due to surgical blood loss or risk of bleeding: yes

## 2013-02-11 NOTE — Progress Notes (Signed)
Pt states she still feels weak and is not ready to sit back up, she is requesting to rest a little more.  Husband is at bedside and call light is within reach.

## 2013-02-11 NOTE — Progress Notes (Signed)
Immediately after removing pt's PIV pt became dizzy and weak. The color in her face became pale.  I positioned her back to ly down in the stretcher.  Her VS at this time are 145/76, HR 71 and O2 sats 95%.  Lights turned out in room and pt relaxing with slow deep breathing encouraged.  Pt does state she has very mild nausea but tolerable.

## 2013-02-11 NOTE — Anesthesia Preprocedure Evaluation (Addendum)
Anesthesia Evaluation  Patient identified by MRN, date of birth, ID band Patient awake  General Assessment Comment:Anemia Anxiety Disorder Chronic Pain Depression Diabetes Mellitus, Type II Gastroesophageal Reflux Disease Hypertension Peripheral Neuropathy Rheumatoid Arthritis   Reviewed: Allergy & Precautions, H&P , NPO status , Patient's Chart, lab work & pertinent test results  Airway Mallampati: II TM Distance: >3 FB Neck ROM: Full    Dental  (+) Edentulous Upper and Dental Advisory Given   Pulmonary former smoker,  CXR: no acute disease.  SOB which she attributes to her weight. breath sounds clear to auscultation  Pulmonary exam normal       Cardiovascular Exercise Tolerance: Good hypertension, Pt. on medications and Pt. on home beta blockers negative cardio ROS  Rhythm:Regular Rate:Normal  She states her heart was "racing" the other night. She attributed this to anxiety, and this rarely occurs.  ECG reviewed.  She has had a cardiac evaluation in the past in [place] because of an abnormal ECG and it was negative.   Neuro/Psych PSYCHIATRIC DISORDERS Anxiety Depression  Neuromuscular disease    GI/Hepatic negative GI ROS, Neg liver ROS, GERD-  Medicated,  Endo/Other  diabetes, Type 2, Oral Hypoglycemic AgentsMorbid obesity  Renal/GU negative Renal ROS  negative genitourinary   Musculoskeletal  (+) Arthritis -, Rheumatoid disorders,    Abdominal   Peds negative pediatric ROS (+)  Hematology negative hematology ROS (+)   Anesthesia Other Findings   Reproductive/Obstetrics negative OB ROS                        Anesthesia Physical Anesthesia Plan  ASA: III  Anesthesia Plan: General   Post-op Pain Management:    Induction: Intravenous  Airway Management Planned: LMA  Additional Equipment:   Intra-op Plan:   Post-operative Plan: Extubation in OR  Informed Consent: I  have reviewed the patients History and Physical, chart, labs and discussed the procedure including the risks, benefits and alternatives for the proposed anesthesia with the patient or authorized representative who has indicated his/her understanding and acceptance.   Dental advisory given  Plan Discussed with: CRNA  Anesthesia Plan Comments:        Anesthesia Quick Evaluation

## 2013-02-11 NOTE — Anesthesia Postprocedure Evaluation (Signed)
  Anesthesia Post-op Note  Patient: [name]  Procedure(s) Performed: Procedure(s) (LRB): LEFT KNEE ARTHROSCOPY WITH MEDIAL MENISECTOMY (Left)  Patient Location: PACU  Anesthesia Type: General  Level of Consciousness: awake and alert   Airway and Oxygen Therapy: Patient Spontanous Breathing  Post-op Pain: mild  Post-op Assessment: Post-op Vital signs reviewed, Patient's Cardiovascular Status Stable, Respiratory Function Stable, Patent Airway and No signs of Nausea or vomiting  Last Vitals:  Filed Vitals:   02/11/13 1520  BP: 145/76  Pulse: 71  Temp:   Resp:     Post-op Vital Signs: stable   Complications: No apparent anesthesia complications

## 2013-02-12 ENCOUNTER — Encounter (HOSPITAL_COMMUNITY): Payer: Self-pay | Admitting: Orthopedic Surgery

## 2013-03-27 ENCOUNTER — Other Ambulatory Visit: Payer: Self-pay | Admitting: Family Medicine

## 2013-05-01 ENCOUNTER — Other Ambulatory Visit: Payer: Self-pay | Admitting: Family Medicine

## 2013-05-08 ENCOUNTER — Other Ambulatory Visit: Payer: Self-pay | Admitting: Family Medicine

## 2013-06-24 ENCOUNTER — Other Ambulatory Visit: Payer: Self-pay | Admitting: Family Medicine

## 2013-06-24 NOTE — Telephone Encounter (Signed)
Last office visit 07/21/2012.  Ok to refill?

## 2013-06-30 LAB — HM DIABETES EYE EXAM: HM DIABETIC EYE EXAM: NEGATIVE

## 2013-08-04 ENCOUNTER — Other Ambulatory Visit: Payer: Self-pay | Admitting: Family Medicine

## 2013-09-14 ENCOUNTER — Other Ambulatory Visit: Payer: Self-pay | Admitting: Family Medicine

## 2013-09-14 DIAGNOSIS — Z0000 Encounter for general adult medical examination without abnormal findings: Secondary | ICD-10-CM

## 2013-09-14 DIAGNOSIS — Z136 Encounter for screening for cardiovascular disorders: Secondary | ICD-10-CM

## 2013-09-14 DIAGNOSIS — I10 Essential (primary) hypertension: Secondary | ICD-10-CM

## 2013-09-14 DIAGNOSIS — E119 Type 2 diabetes mellitus without complications: Secondary | ICD-10-CM

## 2013-09-24 ENCOUNTER — Other Ambulatory Visit (INDEPENDENT_AMBULATORY_CARE_PROVIDER_SITE_OTHER): Payer: Medicare Other

## 2013-09-24 DIAGNOSIS — Z136 Encounter for screening for cardiovascular disorders: Secondary | ICD-10-CM

## 2013-09-24 DIAGNOSIS — E119 Type 2 diabetes mellitus without complications: Secondary | ICD-10-CM

## 2013-09-24 DIAGNOSIS — Z0000 Encounter for general adult medical examination without abnormal findings: Secondary | ICD-10-CM

## 2013-09-24 LAB — LIPID PANEL
CHOL/HDL RATIO: 3
Cholesterol: 148 mg/dL (ref 0–200)
HDL: 54.7 mg/dL (ref >39.00)
LDL Cholesterol: 56 mg/dL (ref 0–99)
TRIGLYCERIDES: 185 mg/dL — AB (ref 0.0–149.0)
VLDL: 37 mg/dL (ref 0.0–40.0)

## 2013-09-24 LAB — CBC WITH DIFFERENTIAL/PLATELET
BASOS PCT: 0.4 % (ref 0.0–3.0)
Basophils Absolute: 0.1 10*3/uL (ref 0.0–0.1)
EOS PCT: 3.2 % (ref 0.0–5.0)
Eosinophils Absolute: 0.4 10*3/uL (ref 0.0–0.7)
HEMATOCRIT: 45.5 % (ref 36.0–46.0)
HEMOGLOBIN: 14.8 g/dL (ref 12.0–15.0)
LYMPHS ABS: 4.7 10*3/uL — AB (ref 0.7–4.0)
Lymphocytes Relative: 33.9 % (ref 12.0–46.0)
MCHC: 32.6 g/dL (ref 30.0–36.0)
MCV: 89.1 fl (ref 78.0–100.0)
MONOS PCT: 8.7 % (ref 3.0–12.0)
Monocytes Absolute: 1.2 10*3/uL — ABNORMAL HIGH (ref 0.1–1.0)
NEUTROS ABS: 7.4 10*3/uL (ref 1.4–7.7)
Neutrophils Relative %: 53.8 % (ref 43.0–77.0)
Platelets: 324 10*3/uL (ref 150.0–400.0)
RBC: 5.1 Mil/uL (ref 3.87–5.11)
RDW: 13.9 % (ref 11.5–14.6)
WBC: 13.8 10*3/uL — ABNORMAL HIGH (ref 4.5–10.5)

## 2013-09-24 LAB — COMPREHENSIVE METABOLIC PANEL
ALBUMIN: 4.2 g/dL (ref 3.5–5.2)
ALT: 30 U/L (ref 0–35)
AST: 23 U/L (ref 0–37)
Alkaline Phosphatase: 109 U/L (ref 39–117)
BUN: 21 mg/dL (ref 6–23)
CALCIUM: 9.8 mg/dL (ref 8.4–10.5)
CHLORIDE: 105 meq/L (ref 96–112)
CO2: 26 mEq/L (ref 19–32)
Creatinine, Ser: 1 mg/dL (ref 0.4–1.2)
GFR: 61.93 mL/min (ref >60.00)
GLUCOSE: 109 mg/dL — AB (ref 70–99)
POTASSIUM: 3.6 meq/L (ref 3.5–5.1)
Sodium: 140 mEq/L (ref 135–145)
Total Bilirubin: 0.6 mg/dL (ref 0.3–1.2)
Total Protein: 7.3 g/dL (ref 6.0–8.3)

## 2013-09-24 LAB — MICROALBUMIN / CREATININE URINE RATIO
Creatinine,U: 269.2 mg/dL
MICROALB/CREAT RATIO: 0.6 mg/g (ref 0.0–30.0)
Microalb, Ur: 1.7 mg/dL (ref 0.0–1.9)

## 2013-09-24 LAB — TSH: TSH: 1.33 u[IU]/mL (ref 0.35–5.50)

## 2013-09-24 LAB — HEMOGLOBIN A1C: Hgb A1c MFr Bld: 5.5 % (ref 4.6–6.5)

## 2013-09-30 ENCOUNTER — Encounter: Payer: Self-pay | Admitting: Family Medicine

## 2013-09-30 ENCOUNTER — Ambulatory Visit (INDEPENDENT_AMBULATORY_CARE_PROVIDER_SITE_OTHER): Payer: Medicare Other | Admitting: Family Medicine

## 2013-09-30 VITALS — BP 120/66 | HR 63 | Temp 97.4°F | Ht 63.0 in | Wt 243.5 lb

## 2013-09-30 DIAGNOSIS — F329 Major depressive disorder, single episode, unspecified: Secondary | ICD-10-CM

## 2013-09-30 DIAGNOSIS — R739 Hyperglycemia, unspecified: Secondary | ICD-10-CM

## 2013-09-30 DIAGNOSIS — L600 Ingrowing nail: Secondary | ICD-10-CM | POA: Insufficient documentation

## 2013-09-30 DIAGNOSIS — I10 Essential (primary) hypertension: Secondary | ICD-10-CM

## 2013-09-30 DIAGNOSIS — Z1231 Encounter for screening mammogram for malignant neoplasm of breast: Secondary | ICD-10-CM

## 2013-09-30 DIAGNOSIS — Z780 Asymptomatic menopausal state: Secondary | ICD-10-CM

## 2013-09-30 DIAGNOSIS — Z23 Encounter for immunization: Secondary | ICD-10-CM

## 2013-09-30 DIAGNOSIS — Z1211 Encounter for screening for malignant neoplasm of colon: Secondary | ICD-10-CM

## 2013-09-30 DIAGNOSIS — F32A Depression, unspecified: Secondary | ICD-10-CM

## 2013-09-30 DIAGNOSIS — B372 Candidiasis of skin and nail: Secondary | ICD-10-CM | POA: Insufficient documentation

## 2013-09-30 DIAGNOSIS — H6120 Impacted cerumen, unspecified ear: Secondary | ICD-10-CM | POA: Insufficient documentation

## 2013-09-30 DIAGNOSIS — Z2911 Encounter for prophylactic immunotherapy for respiratory syncytial virus (RSV): Secondary | ICD-10-CM

## 2013-09-30 DIAGNOSIS — H918X9 Other specified hearing loss, unspecified ear: Secondary | ICD-10-CM

## 2013-09-30 DIAGNOSIS — K219 Gastro-esophageal reflux disease without esophagitis: Secondary | ICD-10-CM

## 2013-09-30 DIAGNOSIS — E119 Type 2 diabetes mellitus without complications: Secondary | ICD-10-CM

## 2013-09-30 DIAGNOSIS — D72829 Elevated white blood cell count, unspecified: Secondary | ICD-10-CM

## 2013-09-30 DIAGNOSIS — Z1239 Encounter for other screening for malignant neoplasm of breast: Secondary | ICD-10-CM

## 2013-09-30 DIAGNOSIS — Z0000 Encounter for general adult medical examination without abnormal findings: Secondary | ICD-10-CM

## 2013-09-30 LAB — HM DIABETES FOOT EXAM

## 2013-09-30 MED ORDER — NYSTATIN 100000 UNIT/GM EX POWD: CUTANEOUS | Status: DC

## 2013-09-30 NOTE — Assessment & Plan Note (Addendum)
Well controlled on current dose of metformin. On ACEI. Urine microalbumin UTD.

## 2013-09-30 NOTE — Assessment & Plan Note (Signed)
Well controlled on current rx.

## 2013-09-30 NOTE — Addendum Note (Signed)
Addended by: Dianne DunARON, Magdeline Prange M on: 09/30/2013 09:52 AM   Modules accepted: Orders

## 2013-09-30 NOTE — Assessment & Plan Note (Signed)
Ceruminosis is noted.  Wax is removed by syringing and manual debridement. Instructions for home care to prevent wax buildup are given.  

## 2013-09-30 NOTE — Patient Instructions (Addendum)
Great to see you. Please stop by the lab to pick up your stool cards.  Please use nystatin powder on your lower abdomen for this rash.  Let me know if it doesn't improve after 10 days.  Please call [facility] to set up your mammogram.  I am referring you to a blood doctor to keep any eye on your elevated white blood cell count. We are also referring you to a podiatrist- we will call you with these appointments.

## 2013-09-30 NOTE — Assessment & Plan Note (Signed)
New- treat with nystatin powder to area. Call or return to clinic prn if these symptoms worsen or fail to improve as anticipated.

## 2013-09-30 NOTE — Addendum Note (Signed)
Addended by: Desmond DikeKNIGHT, Rudell Marlowe H on: 09/30/2013 09:58 AM   Modules accepted: Orders

## 2013-09-30 NOTE — Assessment & Plan Note (Signed)
The patients weight, height, BMI and visual acuity have been recorded in the chart I have made referrals, counseling and provided education to the patient based review of the above and I have provided the pt with a written personalized care plan for preventive services.  Zostavax and influenza vaccine today. IFOB. Mammogram ordered.

## 2013-09-30 NOTE — Assessment & Plan Note (Signed)
Appears chronic but slightly worse- refer to hematology for further evaluation and monitoring.

## 2013-09-30 NOTE — Progress Notes (Signed)
Pre-visit discussion using our clinic review tool. No additional management support is needed unless otherwise documented below in the visit note.  

## 2013-09-30 NOTE — Assessment & Plan Note (Signed)
Without sign of infection at this point. Refer to podiatry for tx. The patient indicates understanding of these issues and agrees with the plan.

## 2013-09-30 NOTE — Progress Notes (Signed)
68 yo here for annual medicare wellness visit.  I have personally reviewed the Medicare Annual Wellness questionnaire and have noted 1. The patient's medical and social history 2. Their use of alcohol, tobacco or illicit drugs 3. Their current medications and supplements 4. The patient's functional ability including ADL's, fall risks, home safety risks and hearing or visual             impairment. 5. Diet and physical activities 6. Evidence for depression or mood disorders  End of life wishes discussed and updated in Social History.  Last pap smear done by me in 02/2011. Has not had colonoscopy- IFOB last done in 11/2010.  Denies any changes in her bowel habits or blood in her stool. Last mammogram 03/2012.  Leukocytosis- persistent.  NO recent infections other than rash on her abdomen. Lab Results  Component Value Date   WBC 13.8* 09/24/2013   HGB 14.8 09/24/2013   HCT 45.5 09/24/2013   MCV 89.1 09/24/2013   PLT 324.0 09/24/2013    She has had a rash "under her fat roll" of her abdomen.    Also feels her ingrown toe nail is returning.  More painful but no drainage or erythema.  DM-  Does not check FSBS regularly.  Compliant with her Metformin.   Lab Results  Component Value Date   HGBA1C 5.5 09/24/2013    HTN- takes Maxzide 1 tab daily, Toprol, and Ramipril 5 mg daily. Denies any blurred vision, CP, or SOB. Has some LE edema, no worse than usual. BP stable today.  Lab Results  Component Value Date   CREATININE 1.0 09/24/2013    Lab Results  Component Value Date   CHOL 148 09/24/2013   HDL 54.70 09/24/2013   LDLCALC 56 09/24/2013   TRIG 185.0* 09/24/2013   CHOLHDL 3 09/24/2013    Patient Active Problem List   Diagnosis Date Noted  . Osteoarthritis of left knee 02/11/2013  . Left knee pain 01/18/2013  . Diabetes mellitus 01/18/2013  . Leukocytosis, unspecified 01/18/2013  . Palpitations 04/04/2012  . Chest pain, atypical 04/02/2012  . Vertigo 04/19/2011  . Anxiety  04/19/2011  . Routine general medical examination at a health care facility 03/08/2011  . Depression   . Hypertension   . GERD (gastroesophageal reflux disease)   . Hyperglycemia   . DJD (degenerative joint disease)   . Restless leg   . Peripheral neuropathy    Past Medical History  Diagnosis Date  . Depression   . Hypertension   . GERD (gastroesophageal reflux disease)   . Hyperglycemia   . DJD (degenerative joint disease)   . Restless leg   . Peripheral neuropathy   . Diabetes mellitus     borderline  . Syncope and collapse   . Blood transfusion without reported diagnosis   . Acute meniscal tear of left knee 01/19/2013   Past Surgical History  Procedure Laterality Date  . Right knee replacement  2008  . Tubal ligation  1976  . Appendectomy  1976  . Tonsillectomy  childhood  . Cardiac catheterization  2006    @ [facility]  . Multiple tooth extractions    . Joint replacement  06/2005    right total knee  . Umbilical hernia repair  07/21/2012    Procedure: HERNIA REPAIR UMBILICAL ADULT;  Surgeon: Adolph Pollack, MD;  Location: [facility];  Service: General;  Laterality: N/A;  . Omentectomy  07/21/2012    Procedure: OMENTECTOMY;  Surgeon: Adolph Pollack, MD;  Location: [facility];  Service: General;  Laterality: N/A;  . Knee arthroscopy with medial menisectomy Left 02/11/2013    Procedure: LEFT KNEE ARTHROSCOPY WITH MEDIAL MENISECTOMY;  Surgeon: Jacki Conesonald A Gioffre, MD;  Location: [facility];  Service: Orthopedics;  Laterality: Left;   History  Substance Use Topics  . Smoking status: Former Smoker -- 1.00 packs/day for 20 years    Types: Cigarettes  . Smokeless tobacco: Never Used  . Alcohol Use: 0.0 oz/week    1-2 Glasses of wine per week     Comment: ocassionally   Family History  Problem Relation Age of Onset  . Parkinsonism Mother   . Hypertension Father   . Heart attack Father    No Known Allergies Current Outpatient Prescriptions on File Prior to Visit  Medication  Sig Dispense Refill  . metFORMIN (GLUCOPHAGE-XR) 500 MG 24 hr tablet TAKE 1 TABLET (500 MG TOTAL) BY MOUTH DAILY WITH BREAKFAST.  90 tablet  0  . metoprolol succinate (TOPROL-XL) 50 MG 24 hr tablet TAKE 1 TABLET BY MOUTH ONCE DAILY  90 tablet  3  . omeprazole (PRILOSEC) 20 MG capsule Take 20 mg by mouth daily.      . ramipril (ALTACE) 5 MG capsule Take 5 mg by mouth daily.      . ramipril (ALTACE) 5 MG capsule TAKE ONE CAPSULE BY MOUTH EVERY DAY  90 capsule  1  . triamterene-hydrochlorothiazide (MAXZIDE-25) 37.5-25 MG per tablet TAKE 1 TABLET BY MOUTH DAILY  90 tablet  0   No current facility-administered medications on file prior to visit.   The PMH, PSH, Social History, Family History, Medications, and allergies have been [facility], and have been updated if relevant.   ROS: See HPI General: Denies fever, chills, sweats. No significant weight loss. Eyes: Denies blurring,significant itching ENT: Denies earache, sore throat, and hoarseness.  Cardiovascular: Denies chest pains, palpitations, dyspnea on exertion,  Respiratory: Denies cough, dyspnea at rest,wheeezing Breast: no concerns about lumps GI: Denies nausea, vomiting, diarrhea, constipation, change in bowel habits, abdominal pain, melena, hematochezia GU: Denies dysuria, hematuria, urinary hesitancy, nocturia, denies STD risk, no concerns about discharge Musculoskeletal: Endorses joint and back pain. Derm: Denies rash, itching Neuro: Denies  paresthesias, frequent falls, frequent headaches Psych: Denies depression, anxiety Endocrine: Denies cold intolerance, heat intolerance, polydipsia Heme: Denies enlarged lymph nodes  Patient Active Problem List   Diagnosis Date Noted  . Osteoarthritis of left knee 02/11/2013  . Left knee pain 01/18/2013  . Diabetes mellitus 01/18/2013  . Leukocytosis, unspecified 01/18/2013  . Palpitations 04/04/2012  . Chest pain, atypical 04/02/2012  . Vertigo 04/19/2011  . Anxiety 04/19/2011   . Routine general medical examination at a health care facility 03/08/2011  . Depression   . Hypertension   . GERD (gastroesophageal reflux disease)   . Hyperglycemia   . DJD (degenerative joint disease)   . Restless leg   . Peripheral neuropathy    Past Medical History  Diagnosis Date  . Depression   . Hypertension   . GERD (gastroesophageal reflux disease)   . Hyperglycemia   . DJD (degenerative joint disease)   . Restless leg   . Peripheral neuropathy   . Diabetes mellitus     borderline  . Syncope and collapse   . Blood transfusion without reported diagnosis   . Acute meniscal tear of left knee 01/19/2013   Past Surgical History  Procedure Laterality Date  . Right knee replacement  2008  . Tubal ligation  1976  .  Appendectomy  1976  . Tonsillectomy  childhood  . Cardiac catheterization  2006    @ [facility]  . Multiple tooth extractions    . Joint replacement  06/2005    right total knee  . Umbilical hernia repair  07/21/2012    Procedure: HERNIA REPAIR UMBILICAL ADULT;  Surgeon: Adolph Pollack, MD;  Location: [facility];  Service: General;  Laterality: N/A;  . Omentectomy  07/21/2012    Procedure: OMENTECTOMY;  Surgeon: Adolph Pollack, MD;  Location: [facility];  Service: General;  Laterality: N/A;  . Knee arthroscopy with medial menisectomy Left 02/11/2013    Procedure: LEFT KNEE ARTHROSCOPY WITH MEDIAL MENISECTOMY;  Surgeon: Jacki Cones, MD;  Location: [facility];  Service: Orthopedics;  Laterality: Left;   History  Substance Use Topics  . Smoking status: Former Smoker -- 1.00 packs/day for 20 years    Types: Cigarettes  . Smokeless tobacco: Never Used  . Alcohol Use: 0.0 oz/week    1-2 Glasses of wine per week     Comment: ocassionally   Family History  Problem Relation Age of Onset  . Parkinsonism Mother   . Hypertension Father   . Heart attack Father    No Known Allergies Current Outpatient Prescriptions on File Prior to Visit  Medication Sig  Dispense Refill  . metFORMIN (GLUCOPHAGE-XR) 500 MG 24 hr tablet TAKE 1 TABLET (500 MG TOTAL) BY MOUTH DAILY WITH BREAKFAST.  90 tablet  0  . metoprolol succinate (TOPROL-XL) 50 MG 24 hr tablet TAKE 1 TABLET BY MOUTH ONCE DAILY  90 tablet  3  . omeprazole (PRILOSEC) 20 MG capsule Take 20 mg by mouth daily.      . ramipril (ALTACE) 5 MG capsule Take 5 mg by mouth daily.      . ramipril (ALTACE) 5 MG capsule TAKE ONE CAPSULE BY MOUTH EVERY DAY  90 capsule  1  . triamterene-hydrochlorothiazide (MAXZIDE-25) 37.5-25 MG per tablet TAKE 1 TABLET BY MOUTH DAILY  90 tablet  0   No current facility-administered medications on file prior to visit.    The PMH, PSH, Social History, Family History, Medications, and allergies have been reviewed in [facility], and have been updated if relevant.  Physical exam: BP 120/66  Pulse 63  Temp(Src) 97.4 F (36.3 C) (Oral)  Ht 5\' 3"  (1.6 m)  Wt 243 lb 8 oz (110.451 kg)  BMI 43.14 kg/m2  SpO2 97%  General:  Well-developed,overweight, well-nourished,in no acute distress; alert,appropriate and cooperative throughout examination Head:  normocephalic and atraumatic.   Eyes:  vision grossly intact, pupils equal, pupils round, and pupils reactive to light.   Ears:  +cerumen bilaterally Nose:  no external deformity.   Mouth:  good dentition.   Neck:  No deformities, masses, or tenderness noted. Breasts:  No mass, nodules, thickening, tenderness, bulging, retraction, inflamation, nipple discharge or skin changes noted.   +multiple SKs Lungs:  Normal respiratory effort, chest expands symmetrically. Lungs are clear to auscultation, no crackles or wheezes. Heart:  Normal rate and regular rhythm. S1 and S2 normal without gallop, murmur, click, rub or other extra sounds. Abdomen:  Bowel sounds positive,abdomen soft and non-tender without masses, organomegaly or hernias noted. + intertrigo of abdomen/groin Msk:  No deformity or scoliosis noted of thoracic or lumbar spine.    Extremities:  No clubbing, cyanosis, edema, or deformity noted with normal full range of motion of all joints.   Neurologic:  alert & oriented X3 and gait normal.   Skin:  Intact without suspicious lesions or rashes, +multiple SK Cervical Nodes:  No lymphadenopathy noted Axillary Nodes:  No palpable lymphadenopathy Psych:  Cognition and judgment appear intact. Alert and cooperative with normal attention span and concentration. No apparent delusions, illusions, hallucinations Diabetic foot exam: Left foot- + great toe ingrown, no signs of paronychia at this point, some discoloration of toe nails. No skin breakdown No calluses  Normal DP pulses Normal sensation to light touch and monofilament Nails normal     Assessment and Plan:

## 2013-10-01 NOTE — Addendum Note (Signed)
Addended by: Dianne DunARON, TALIA M on: 10/01/2013 09:24 AM   Modules accepted: Orders, Medications

## 2013-10-02 ENCOUNTER — Telehealth: Payer: Self-pay | Admitting: Family Medicine

## 2013-10-02 ENCOUNTER — Telehealth: Payer: Self-pay

## 2013-10-02 NOTE — Telephone Encounter (Signed)
Relevant patient education mailed to patient.  

## 2013-10-08 ENCOUNTER — Ambulatory Visit: Payer: Self-pay | Admitting: Family Medicine

## 2013-10-09 ENCOUNTER — Encounter: Payer: Self-pay | Admitting: Family Medicine

## 2013-10-12 ENCOUNTER — Ambulatory Visit: Payer: Self-pay | Admitting: Family Medicine

## 2013-10-12 ENCOUNTER — Other Ambulatory Visit: Payer: Self-pay | Admitting: Internal Medicine

## 2013-10-12 ENCOUNTER — Encounter: Payer: Self-pay | Admitting: Family Medicine

## 2013-10-15 ENCOUNTER — Ambulatory Visit: Payer: Self-pay | Admitting: Hematology and Oncology

## 2013-10-15 LAB — CBC CANCER CENTER
BASOS ABS: 0.1 x10 3/mm (ref 0.0–0.1)
Basophil %: 1.2 %
EOS PCT: 2.8 %
Eosinophil #: 0.3 x10 3/mm (ref 0.0–0.7)
HCT: 44.2 % (ref 35.0–47.0)
HGB: 14.4 g/dL (ref 12.0–16.0)
LYMPHS PCT: 31.9 %
Lymphocyte #: 3.2 x10 3/mm (ref 1.0–3.6)
MCH: 28.3 pg (ref 26.0–34.0)
MCHC: 32.5 g/dL (ref 32.0–36.0)
MCV: 87 fL (ref 80–100)
Monocyte #: 0.8 x10 3/mm (ref 0.2–0.9)
Monocyte %: 8 %
Neutrophil #: 5.7 x10 3/mm (ref 1.4–6.5)
Neutrophil %: 56.1 %
Platelet: 268 x10 3/mm (ref 150–440)
RBC: 5.07 10*6/uL (ref 3.80–5.20)
RDW: 13.3 % (ref 11.5–14.5)
WBC: 10.1 x10 3/mm (ref 3.6–11.0)

## 2013-10-16 ENCOUNTER — Other Ambulatory Visit: Payer: Self-pay | Admitting: Internal Medicine

## 2013-10-23 ENCOUNTER — Other Ambulatory Visit (INDEPENDENT_AMBULATORY_CARE_PROVIDER_SITE_OTHER): Payer: Medicare Other

## 2013-10-23 DIAGNOSIS — Z1211 Encounter for screening for malignant neoplasm of colon: Secondary | ICD-10-CM

## 2013-10-23 LAB — FECAL OCCULT BLOOD, IMMUNOCHEMICAL: Fecal Occult Bld: NEGATIVE

## 2013-10-25 ENCOUNTER — Ambulatory Visit: Payer: Self-pay | Admitting: Hematology and Oncology

## 2013-10-26 ENCOUNTER — Other Ambulatory Visit: Payer: Self-pay | Admitting: Family Medicine

## 2013-10-26 ENCOUNTER — Encounter: Payer: Self-pay | Admitting: *Deleted

## 2013-10-26 DIAGNOSIS — R928 Other abnormal and inconclusive findings on diagnostic imaging of breast: Secondary | ICD-10-CM

## 2013-11-01 ENCOUNTER — Other Ambulatory Visit: Payer: Self-pay | Admitting: Family Medicine

## 2013-11-09 ENCOUNTER — Encounter: Payer: Self-pay | Admitting: *Deleted

## 2013-11-09 NOTE — Telephone Encounter (Signed)
Opened in error

## 2014-02-03 ENCOUNTER — Other Ambulatory Visit: Payer: Self-pay | Admitting: Family Medicine

## 2014-04-12 ENCOUNTER — Other Ambulatory Visit: Payer: Self-pay | Admitting: Family Medicine

## 2014-05-06 ENCOUNTER — Other Ambulatory Visit: Payer: Self-pay | Admitting: Family Medicine

## 2014-06-30 ENCOUNTER — Other Ambulatory Visit: Payer: Self-pay

## 2014-06-30 MED ORDER — METOPROLOL SUCCINATE ER 50 MG PO TB24: 50.0000 mg | ORAL_TABLET | Freq: Every day | ORAL | Status: DC

## 2014-08-03 ENCOUNTER — Other Ambulatory Visit: Payer: Self-pay | Admitting: *Deleted

## 2014-08-03 MED ORDER — METFORMIN HCL ER 500 MG PO TB24: ORAL_TABLET | ORAL | Status: DC

## 2014-08-06 ENCOUNTER — Other Ambulatory Visit: Payer: Self-pay | Admitting: *Deleted

## 2014-08-06 MED ORDER — RAMIPRIL 5 MG PO CAPS: 5.0000 mg | ORAL_CAPSULE | Freq: Every day | ORAL | Status: DC

## 2014-08-06 MED ORDER — TRIAMTERENE-HCTZ 37.5-25 MG PO TABS: 1.0000 | ORAL_TABLET | Freq: Every day | ORAL | Status: DC

## 2014-08-06 NOTE — Addendum Note (Signed)
Addended by: Desmond DikeKNIGHT, Aniyiah Zell H on: 08/06/2014 09:47 AM   Modules accepted: Orders

## 2014-09-04 ENCOUNTER — Other Ambulatory Visit: Payer: Self-pay | Admitting: Family Medicine

## 2014-09-15 ENCOUNTER — Other Ambulatory Visit: Payer: Self-pay | Admitting: Internal Medicine

## 2014-09-15 DIAGNOSIS — I10 Essential (primary) hypertension: Secondary | ICD-10-CM

## 2014-09-15 DIAGNOSIS — E119 Type 2 diabetes mellitus without complications: Secondary | ICD-10-CM

## 2014-09-17 ENCOUNTER — Other Ambulatory Visit: Payer: Medicare Other

## 2014-09-20 ENCOUNTER — Other Ambulatory Visit (INDEPENDENT_AMBULATORY_CARE_PROVIDER_SITE_OTHER): Payer: Medicare HMO

## 2014-09-20 DIAGNOSIS — E119 Type 2 diabetes mellitus without complications: Secondary | ICD-10-CM

## 2014-09-20 DIAGNOSIS — I10 Essential (primary) hypertension: Secondary | ICD-10-CM

## 2014-09-20 LAB — LIPID PANEL
Cholesterol: 132 mg/dL (ref 0–200)
HDL: 52.3 mg/dL (ref >39.00)
LDL Cholesterol: 48 mg/dL (ref 0–99)
NonHDL: 79.7
Total CHOL/HDL Ratio: 3
Triglycerides: 161 mg/dL — ABNORMAL HIGH (ref 0.0–149.0)
VLDL: 32.2 mg/dL (ref 0.0–40.0)

## 2014-09-20 LAB — CBC
HEMATOCRIT: 42.2 % (ref 36.0–46.0)
Hemoglobin: 14.4 g/dL (ref 12.0–15.0)
MCHC: 34.1 g/dL (ref 30.0–36.0)
MCV: 85.6 fl (ref 78.0–100.0)
PLATELETS: 305 10*3/uL (ref 150.0–400.0)
RBC: 4.93 Mil/uL (ref 3.87–5.11)
RDW: 13.4 % (ref 11.5–15.5)
WBC: 12.4 10*3/uL — ABNORMAL HIGH (ref 4.0–10.5)

## 2014-09-20 LAB — COMPREHENSIVE METABOLIC PANEL
ALT: 36 U/L — ABNORMAL HIGH (ref 0–35)
AST: 28 U/L (ref 0–37)
Albumin: 4.2 g/dL (ref 3.5–5.2)
Alkaline Phosphatase: 86 U/L (ref 39–117)
BUN: 21 mg/dL (ref 6–23)
CO2: 26 meq/L (ref 19–32)
CREATININE: 0.93 mg/dL (ref 0.40–1.20)
Calcium: 10 mg/dL (ref 8.4–10.5)
Chloride: 102 mEq/L (ref 96–112)
GFR: 63.29 mL/min (ref >60.00)
Glucose, Bld: 118 mg/dL — ABNORMAL HIGH (ref 70–99)
Potassium: 3.8 mEq/L (ref 3.5–5.1)
Sodium: 138 mEq/L (ref 135–145)
Total Bilirubin: 0.6 mg/dL (ref 0.2–1.2)
Total Protein: 6.8 g/dL (ref 6.0–8.3)

## 2014-09-20 LAB — MICROALBUMIN / CREATININE URINE RATIO
Creatinine,U: 392.4 mg/dL
MICROALB UR: 2.2 mg/dL — AB (ref 0.0–1.9)
MICROALB/CREAT RATIO: 0.6 mg/g (ref 0.0–30.0)

## 2014-09-20 LAB — HEMOGLOBIN A1C: Hgb A1c MFr Bld: 5.8 % (ref 4.6–6.5)

## 2014-09-20 LAB — TSH: TSH: 6.1 u[IU]/mL — AB (ref 0.35–4.50)

## 2014-09-22 ENCOUNTER — Other Ambulatory Visit: Payer: Self-pay | Admitting: Family Medicine

## 2014-09-23 ENCOUNTER — Ambulatory Visit (INDEPENDENT_AMBULATORY_CARE_PROVIDER_SITE_OTHER): Payer: Medicare HMO | Admitting: Family Medicine

## 2014-09-23 ENCOUNTER — Encounter: Payer: Self-pay | Admitting: Family Medicine

## 2014-09-23 VITALS — BP 136/66 | HR 71 | Temp 97.8°F | Ht 63.0 in | Wt 244.5 lb

## 2014-09-23 DIAGNOSIS — Z0000 Encounter for general adult medical examination without abnormal findings: Secondary | ICD-10-CM

## 2014-09-23 DIAGNOSIS — I10 Essential (primary) hypertension: Secondary | ICD-10-CM

## 2014-09-23 DIAGNOSIS — R928 Other abnormal and inconclusive findings on diagnostic imaging of breast: Secondary | ICD-10-CM

## 2014-09-23 DIAGNOSIS — D72829 Elevated white blood cell count, unspecified: Secondary | ICD-10-CM

## 2014-09-23 DIAGNOSIS — Z23 Encounter for immunization: Secondary | ICD-10-CM

## 2014-09-23 DIAGNOSIS — R7989 Other specified abnormal findings of blood chemistry: Secondary | ICD-10-CM

## 2014-09-23 DIAGNOSIS — E1142 Type 2 diabetes mellitus with diabetic polyneuropathy: Secondary | ICD-10-CM

## 2014-09-23 DIAGNOSIS — F419 Anxiety disorder, unspecified: Secondary | ICD-10-CM

## 2014-09-23 DIAGNOSIS — R946 Abnormal results of thyroid function studies: Secondary | ICD-10-CM

## 2014-09-23 DIAGNOSIS — Z1211 Encounter for screening for malignant neoplasm of colon: Secondary | ICD-10-CM

## 2014-09-23 LAB — T4, FREE: Free T4: 0.86 ng/dL (ref 0.60–1.60)

## 2014-09-23 LAB — T3, FREE: T3 FREE: 3.3 pg/mL (ref 2.3–4.2)

## 2014-09-23 LAB — TSH: TSH: 4.66 u[IU]/mL — AB (ref 0.35–4.50)

## 2014-09-23 MED ORDER — METFORMIN HCL ER 500 MG PO TB24: ORAL_TABLET | ORAL | Status: DC

## 2014-09-23 MED ORDER — METOPROLOL SUCCINATE ER 50 MG PO TB24: ORAL_TABLET | ORAL | Status: DC

## 2014-09-23 MED ORDER — TRIAMTERENE-HCTZ 37.5-25 MG PO TABS: 1.0000 | ORAL_TABLET | Freq: Every day | ORAL | Status: DC

## 2014-09-23 MED ORDER — RAMIPRIL 5 MG PO CAPS: ORAL_CAPSULE | ORAL | Status: DC

## 2014-09-23 NOTE — Assessment & Plan Note (Signed)
Given number for [facility]- mammo already ordered. Advised to schedule ASAP.

## 2014-09-23 NOTE — Progress Notes (Addendum)
69 yo pleasant female here for annual medicare wellness visit.  I have personally reviewed the Medicare Annual Wellness questionnaire and have noted 1. The patient's medical and social history 2. Their use of alcohol, tobacco or illicit drugs 3. Their current medications and supplements 4. The patient's functional ability including ADL's, fall risks, home safety risks and hearing or visual             impairment. 5. Diet and physical activities 6. Evidence for depression or mood disorders  End of life wishes discussed and updated in Social History.  The roster of all physicians providing medical care to patient - is listed in the Snapshot section of the chart.   Last pap smear done by me in 02/2011.  No h/o postmenopausal bleeding. Has not had colonoscopy- IFOB last done in 10/03/13- neg  Denies any changes in her bowel habits or blood in her stool. Last mammogram 10/12/13- advised 6 month follow up- we placed order but she did not keep appointment. Zoster 09/30/13 Pneumovax 04/02/12 Influenza vaccine 09/2013 Eye exam 07/03/13  Elevated TSH-  Has been more fatigued lately.  Denies any other symptoms of thyroid dysfunction.  Wt stable. Wt Readings from Last 3 Encounters:  09/23/14 244 lb 8 oz (110.904 kg)  09/30/13 243 lb 8 oz (110.451 kg)  02/11/13 238 lb 9.6 oz (108.228 kg)     Leukocytosis- persistent.  NO recent infections other than rash on her abdomen.  I did refer her to hematology last year. Per pt, saw hematologist at [facility]- no notes found in Epic.  Per pt, was told everything "fine." Lab Results  Component Value Date   WBC 12.4* 09/20/2014   HGB 14.4 09/20/2014   HCT 42.2 09/20/2014   MCV 85.6 09/20/2014   PLT 305.0 09/20/2014     DM-  Does not check FSBS regularly.  Compliant with her Metformin.   Lab Results  Component Value Date   HGBA1C 5.8 09/20/2014   +urine micro- does take ACEI daily.  HTN- takes Maxzide 1 tab daily, Toprol, and Ramipril 5 mg daily. Denies  any blurred vision, CP, or SOB. Has some LE edema, no worse than usual. BP stable today.  Lab Results  Component Value Date   CREATININE 0.93 09/20/2014    Lab Results  Component Value Date   CHOL 132 09/20/2014   HDL 52.30 09/20/2014   LDLCALC 48 09/20/2014   TRIG 161.0* 09/20/2014   CHOLHDL 3 09/20/2014    Patient Active Problem List   Diagnosis Date Noted  . 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 Leukocytosis, unspecified 01/18/2013    Priority: High  . Depression     Priority: High  . Hypertension     Priority: High  . GERD (gastroesophageal reflux disease)     Priority: Medium  . Peripheral neuropathy     Priority: Medium  . Medicare annual wellness visit, subsequent 09/23/2014  . Ingrown left greater toenail 09/30/2013  . DJD (degenerative joint disease)   . Restless leg    Past Medical History  Diagnosis Date  . Depression   . Hypertension   . GERD (gastroesophageal reflux disease)   . Hyperglycemia   . DJD (degenerative joint disease)   . Restless leg   . Peripheral neuropathy   . Diabetes mellitus     borderline  . Syncope and collapse   . Blood transfusion without reported diagnosis   . Acute meniscal tear of left knee 01/19/2013  Past Surgical History  Procedure Laterality Date  . Right knee replacement  2008  . Tubal ligation  1976  . Appendectomy  1976  . Tonsillectomy  childhood  . Cardiac catheterization  2006    @ [facility]  . Multiple tooth extractions    . Joint replacement  06/2005    right total knee  . Umbilical hernia repair  07/21/2012    Procedure: HERNIA REPAIR UMBILICAL ADULT;  Surgeon: Adolph Pollack, MD;  Location: [facility];  Service: General;  Laterality: N/A;  . Omentectomy  07/21/2012    Procedure: OMENTECTOMY;  Surgeon: Adolph Pollack, MD;  Location: [facility];  Service: General;  Laterality: N/A;  . Knee arthroscopy with medial menisectomy Left 02/11/2013    Procedure: LEFT KNEE  ARTHROSCOPY WITH MEDIAL MENISECTOMY;  Surgeon: Jacki Cones, MD;  Location: [facility];  Service: Orthopedics;  Laterality: Left;   History  Substance Use Topics  . Smoking status: Former Smoker -- 1.00 packs/day for 20 years    Types: Cigarettes  . Smokeless tobacco: Never Used  . Alcohol Use: 0.0 oz/week    1-2 Glasses of wine per week     Comment: ocassionally   Family History  Problem Relation Age of Onset  . Parkinsonism Mother   . Hypertension Father   . Heart attack Father    No Known Allergies Current Outpatient Prescriptions on File Prior to Visit  Medication Sig Dispense Refill  . nystatin (MYCOSTATIN/NYSTOP) 100000 UNIT/GM POWD Apply to area up twice daily until rash resolves. 60 g 0  . omeprazole (PRILOSEC) 20 MG capsule Take 20 mg by mouth daily.     No current facility-administered medications on file prior to visit.   The PMH, PSH, Social History, Family History, Medications, and allergies have been reviewed in [facility], and have been updated if relevant.   ROS: See HPI General: +fatigue, Denies fever, chills, sweats. No significant weight loss. Eyes: Denies blurring,significant itching ENT: Denies earache, sore throat, and hoarseness.  No dysphagia Cardiovascular: Denies chest pains, palpitations, dyspnea on exertion,  Respiratory: Denies cough, dyspnea at rest,wheeezing Breast: no concerns about lumps GI: Denies nausea, vomiting, diarrhea, constipation, change in bowel habits, abdominal pain, melena, hematochezia GU: Denies dysuria, hematuria, urinary hesitancy, nocturia, denies STD risk, no concerns about discharge Musculoskeletal: Endorses joint and back pain. Derm: Denies rash, itching Neuro: Denies  paresthesias, frequent falls, frequent headaches Psych: Denies depression, anxiety Endocrine: Denies cold intolerance, heat intolerance, polydipsia Heme: Denies enlarged lymph nodes  Patient Active Problem List   Diagnosis Date Noted  . Diabetes with  neurologic complications 01/18/2013    Priority: High  . Leukocytosis, unspecified 01/18/2013    Priority: High  . Depression     Priority: High  . Hypertension     Priority: High  . GERD (gastroesophageal reflux disease)     Priority: Medium  . Peripheral neuropathy     Priority: Medium  . Medicare annual wellness visit, subsequent 09/23/2014  . Ingrown left greater toenail 09/30/2013  . DJD (degenerative joint disease)   . Restless leg    Past Medical History  Diagnosis Date  . Depression   . Hypertension   . GERD (gastroesophageal reflux disease)   . Hyperglycemia   . DJD (degenerative joint disease)   . Restless leg   . Peripheral neuropathy   . Diabetes mellitus     borderline  . Syncope and collapse   . Blood transfusion without reported diagnosis   . Acute meniscal tear  of left knee 01/19/2013   Past Surgical History  Procedure Laterality Date  . Right knee replacement  2008  . Tubal ligation  1976  . Appendectomy  1976  . Tonsillectomy  childhood  . Cardiac catheterization  2006    @ [facility]  . Multiple tooth extractions    . Joint replacement  06/2005    right total knee  . Umbilical hernia repair  07/21/2012    Procedure: HERNIA REPAIR UMBILICAL ADULT;  Surgeon: Adolph Pollack, MD;  [facility];  Service: General;  Laterality: N/A;  . Omentectomy  07/21/2012    Procedure: OMENTECTOMY;  Surgeon: Adolph Pollack, MD;  [facility];  Service: General;  Laterality: N/A;  . Knee arthroscopy with medial menisectomy Left 02/11/2013    Procedure: LEFT KNEE ARTHROSCOPY WITH MEDIAL MENISECTOMY;  Surgeon: Jacki Cones, MD;  [facility];  Service: Orthopedics;  Laterality: Left;   History  Substance Use Topics  . Smoking status: Former Smoker -- 1.00 packs/day for 20 years    Types: Cigarettes  . Smokeless tobacco: Never Used  . Alcohol Use: 0.0 oz/week    1-2 Glasses of wine per week     Comment: ocassionally   Family History  Problem  Relation Age of Onset  . Parkinsonism Mother   . Hypertension Father   . Heart attack Father    No Known Allergies Current Outpatient Prescriptions on File Prior to Visit  Medication Sig Dispense Refill  . nystatin (MYCOSTATIN/NYSTOP) 100000 UNIT/GM POWD Apply to area up twice daily until rash resolves. 60 g 0  . omeprazole (PRILOSEC) 20 MG capsule Take 20 mg by mouth daily.     No current facility-administered medications on file prior to visit.    The PMH, PSH, Social History, Family History, Medications, and allergies have been reviewed in [facility], and have been updated if relevant.  Physical exam: BP 136/66 mmHg  Pulse 71  Temp(Src) 97.8 F (36.6 C) (Oral)  Ht  (1.6 m)  Wt 244 lb 8 oz (110.904 kg)  BMI 43.32 kg/m2  SpO2 97%  General:  Well-developed,overweight, well-nourished,in no acute distress; alert,appropriate and cooperative throughout examination Head:  normocephalic and atraumatic.   Eyes:  vision grossly intact, pupils equal, pupils round, and pupils reactive to light.   Nose:  no external deformity.   Mouth:  good dentition.   Neck:  No deformities, masses, or tenderness noted. Breasts:  No mass, nodules, thickening, tenderness, bulging, retraction, inflamation, nipple discharge or skin changes noted right breast- ?area of dense tissue/mass deep within left breast   +multiple SKs Lungs:  Normal respiratory effort, chest expands symmetrically. Lungs are clear to auscultation, no crackles or wheezes. Heart:  Normal rate and regular rhythm. S1 and S2 normal without gallop, murmur, click, rub or other extra sounds. Abdomen:  Bowel sounds positive,abdomen soft and non-tender without masses, organomegaly or hernias noted. Msk:  No deformity or scoliosis noted of thoracic or lumbar spine.   Extremities:  No clubbing, cyanosis, edema, or deformity noted with normal full range of motion of all joints.   Neurologic:  alert & oriented X3 and gait normal.   Skin:  Intact  without suspicious lesions or rashes, +multiple SK Cervical Nodes:  No lymphadenopathy noted Axillary Nodes:  No palpable lymphadenopathy Psych:  Cognition and judgment appear intact. Alert and cooperative with normal attention span and concentration. No apparent delusions, illusions, hallucinations

## 2014-09-23 NOTE — Assessment & Plan Note (Addendum)
The patients weight, height, BMI and visual acuity have been recorded in the chart I have made referrals, counseling and provided education to the patient based review of the above and I have provided the pt with a written personalized care plan for preventive services.  prevnar 13 today. Advised to schedule mammogram ASAP as well. Declining colonoscopy but agrees to IFOB today.

## 2014-09-23 NOTE — Assessment & Plan Note (Signed)
Stable

## 2014-09-23 NOTE — Addendum Note (Signed)
Addended by: Dianne DunARON, Greenly Rarick M on: 09/23/2014 09:53 AM   Modules accepted: Kipp BroodSmartSet

## 2014-09-23 NOTE — Addendum Note (Signed)
Addended by: Dianne DunARON, Marris Frontera M on: 09/23/2014 09:51 AM   Modules accepted: Kipp BroodSmartSet

## 2014-09-23 NOTE — Progress Notes (Signed)
Pre visit review using our clinic review tool, if applicable. No additional management support is needed unless otherwise documented below in the visit note. 

## 2014-09-23 NOTE — Patient Instructions (Addendum)
Good to see you. Please schedule an eye exam.  Please call to schedule your mammogram.

## 2014-09-23 NOTE — Assessment & Plan Note (Signed)
New- clinically hypothyroid as well. Check full thyroid panel, will likely need to start thyroid replacement once we have those results.

## 2014-09-23 NOTE — Addendum Note (Signed)
Addended by: Desmond DikeKNIGHT, Kaleisha Bhargava H on: 09/23/2014 09:50 AM   Modules accepted: Orders

## 2014-09-23 NOTE — Assessment & Plan Note (Signed)
Now with pos urine micro. Due for eye exam. On ACEI BP and LDL at goal.

## 2014-09-23 NOTE — Addendum Note (Signed)
Addended by: Dianne DunARON, TALIA M on: 09/23/2014 11:24 AM   Modules accepted: Orders, SmartSet

## 2014-10-12 ENCOUNTER — Other Ambulatory Visit (INDEPENDENT_AMBULATORY_CARE_PROVIDER_SITE_OTHER): Payer: Medicare HMO

## 2014-10-12 DIAGNOSIS — Z1211 Encounter for screening for malignant neoplasm of colon: Secondary | ICD-10-CM

## 2014-10-12 LAB — FECAL OCCULT BLOOD, IMMUNOCHEMICAL: Fecal Occult Bld: NEGATIVE

## 2014-10-13 ENCOUNTER — Encounter: Payer: Self-pay | Admitting: *Deleted

## 2014-10-21 ENCOUNTER — Ambulatory Visit: Payer: Self-pay | Admitting: Family Medicine

## 2014-10-22 ENCOUNTER — Encounter: Payer: Self-pay | Admitting: Family Medicine

## 2015-03-24 ENCOUNTER — Ambulatory Visit (INDEPENDENT_AMBULATORY_CARE_PROVIDER_SITE_OTHER): Payer: Medicare HMO | Admitting: Family Medicine

## 2015-03-24 ENCOUNTER — Encounter: Payer: Self-pay | Admitting: Family Medicine

## 2015-03-24 VITALS — BP 128/70 | HR 63 | Temp 98.0°F | Wt 241.5 lb

## 2015-03-24 DIAGNOSIS — M150 Primary generalized (osteo)arthritis: Secondary | ICD-10-CM

## 2015-03-24 DIAGNOSIS — E1142 Type 2 diabetes mellitus with diabetic polyneuropathy: Secondary | ICD-10-CM

## 2015-03-24 DIAGNOSIS — M159 Polyosteoarthritis, unspecified: Secondary | ICD-10-CM

## 2015-03-24 DIAGNOSIS — I10 Essential (primary) hypertension: Secondary | ICD-10-CM | POA: Diagnosis not present

## 2015-03-24 DIAGNOSIS — R7989 Other specified abnormal findings of blood chemistry: Secondary | ICD-10-CM

## 2015-03-24 DIAGNOSIS — R946 Abnormal results of thyroid function studies: Secondary | ICD-10-CM | POA: Diagnosis not present

## 2015-03-24 LAB — LIPID PANEL
CHOLESTEROL: 132 mg/dL (ref 0–200)
HDL: 53.3 mg/dL (ref >39.00)
LDL CALC: 53 mg/dL (ref 0–99)
NonHDL: 78.68
Total CHOL/HDL Ratio: 2
Triglycerides: 126 mg/dL (ref 0.0–149.0)
VLDL: 25.2 mg/dL (ref 0.0–40.0)

## 2015-03-24 LAB — CBC WITH DIFFERENTIAL/PLATELET
Basophils Absolute: 0.1 10*3/uL (ref 0.0–0.1)
Basophils Relative: 0.6 % (ref 0.0–3.0)
EOS ABS: 0.4 10*3/uL (ref 0.0–0.7)
Eosinophils Relative: 3.6 % (ref 0.0–5.0)
HCT: 42 % (ref 36.0–46.0)
Hemoglobin: 13.9 g/dL (ref 12.0–15.0)
LYMPHS ABS: 3.1 10*3/uL (ref 0.7–4.0)
LYMPHS PCT: 31.2 % (ref 12.0–46.0)
MCHC: 33.1 g/dL (ref 30.0–36.0)
MCV: 88.4 fl (ref 78.0–100.0)
MONO ABS: 0.9 10*3/uL (ref 0.1–1.0)
Monocytes Relative: 8.7 % (ref 3.0–12.0)
NEUTROS ABS: 5.6 10*3/uL (ref 1.4–7.7)
NEUTROS PCT: 55.9 % (ref 43.0–77.0)
Platelets: 262 10*3/uL (ref 150.0–400.0)
RBC: 4.75 Mil/uL (ref 3.87–5.11)
RDW: 13.8 % (ref 11.5–15.5)
WBC: 10 10*3/uL (ref 4.0–10.5)

## 2015-03-24 LAB — COMPREHENSIVE METABOLIC PANEL
ALBUMIN: 4 g/dL (ref 3.5–5.2)
ALK PHOS: 89 U/L (ref 39–117)
ALT: 29 U/L (ref 0–35)
AST: 25 U/L (ref 0–37)
BUN: 14 mg/dL (ref 6–23)
CALCIUM: 9.8 mg/dL (ref 8.4–10.5)
CO2: 28 mEq/L (ref 19–32)
Chloride: 105 mEq/L (ref 96–112)
Creatinine, Ser: 0.91 mg/dL (ref 0.40–1.20)
GFR: 64.81 mL/min (ref >60.00)
GLUCOSE: 103 mg/dL — AB (ref 70–99)
Potassium: 4.3 mEq/L (ref 3.5–5.1)
Sodium: 140 mEq/L (ref 135–145)
TOTAL PROTEIN: 6.6 g/dL (ref 6.0–8.3)
Total Bilirubin: 0.6 mg/dL (ref 0.2–1.2)

## 2015-03-24 LAB — T4, FREE: FREE T4: 0.96 ng/dL (ref 0.60–1.60)

## 2015-03-24 LAB — TSH: TSH: 2.51 u[IU]/mL (ref 0.35–4.50)

## 2015-03-24 LAB — HEMOGLOBIN A1C: Hgb A1c MFr Bld: 5.4 % (ref 4.6–6.5)

## 2015-03-24 NOTE — Assessment & Plan Note (Signed)
Well controlled and at goal for a diabetic. 

## 2015-03-24 NOTE — Progress Notes (Signed)
Pre visit review using our clinic review tool, if applicable. No additional management support is needed unless otherwise documented below in the visit note. 

## 2015-03-24 NOTE — Assessment & Plan Note (Signed)
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 One ACEI. Still has not had her eye exam. BP at goal today. Recheck LDL

## 2015-03-24 NOTE — Progress Notes (Signed)
69 yo pleasant female here for follow up of chronic medical conditions.   Elevated TSH-  Elevated in January but clinically euthyroid and FT4 and T3 were normal at follow up. Lab Results  Component Value Date   TSH 4.66* 09/23/2014    Wt Readings from Last 3 Encounters:  03/24/15 241 lb 8 oz (109.544 kg)  09/23/14 244 lb 8 oz (110.904 kg)  09/30/13 243 lb 8 oz (110.451 kg)     Leukocytosis- Per pt, saw hematologist at [facility]- no notes found in Epic.  Per pt, was told everything "fine." Lab Results  Component Value Date   WBC 12.4* 09/20/2014   HGB 14.4 09/20/2014   HCT 42.2 09/20/2014   MCV 85.6 09/20/2014   PLT 305.0 09/20/2014     DM-  Does not check FSBS regularly.  Compliant with her Metformin.   Lab Results  Component Value Date   HGBA1C 5.8 09/20/2014   +urine micro in 08/2014- does take ACEI daily.  HTN- takes Maxzide 1 tab daily, Toprol, and Ramipril 5 mg daily. Denies any blurred vision, CP, or SOB. No LE edema  Lab Results  Component Value Date   CREATININE 0.93 09/20/2014    Lab Results  Component Value Date   CHOL 132 09/20/2014   HDL 52.30 09/20/2014   LDLCALC 48 09/20/2014   TRIG 161.0* 09/20/2014   CHOLHDL 3 09/20/2014    Patient Active Problem List   Diagnosis Date Noted  . 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 Leukocytosis 01/18/2013    Priority: High  . Depression     Priority: High  . Hypertension     Priority: High  . GERD (gastroesophageal reflux disease)     Priority: Medium  . Peripheral neuropathy     Priority: Medium  . Elevated TSH 09/23/2014  . DJD (degenerative joint disease)   . Restless leg    Past Medical History  Diagnosis Date  . Depression   . Hypertension   . GERD (gastroesophageal reflux disease)   . Hyperglycemia   . DJD (degenerative joint disease)   . Restless leg   . Peripheral neuropathy   . Diabetes mellitus     borderline  . Syncope and collapse   . Blood  transfusion without reported diagnosis   . Acute meniscal tear of left knee 01/19/2013   Past Surgical History  Procedure Laterality Date  . Right knee replacement  2008  . Tubal ligation  1976  . Appendectomy  1976  . Tonsillectomy  childhood  . Cardiac catheterization  2006    @ [facility]  . Multiple tooth extractions    . Joint replacement  06/2005    right total knee  . Umbilical hernia repair  07/21/2012    Procedure: HERNIA REPAIR UMBILICAL ADULT;  Surgeon: Adolph Pollack, MD;  [facility];  Service: General;  Laterality: N/A;  . Omentectomy  07/21/2012    Procedure: OMENTECTOMY;  Surgeon: Adolph Pollack, MD;  [facility];  Service: General;  Laterality: N/A;  . Knee arthroscopy with medial menisectomy Left 02/11/2013    Procedure: LEFT KNEE ARTHROSCOPY WITH MEDIAL MENISECTOMY;  Surgeon: Jacki Cones, MD;  Location: [facility];  Service: Orthopedics;  Laterality: Left;   History  Substance Use Topics  . Smoking status: Former Smoker -- 1.00 packs/day for 20 years    Types: Cigarettes  . Smokeless tobacco: Never Used  . Alcohol Use: 0.0 oz/week    1-2  Glasses of wine per week     Comment: ocassionally   Family History  Problem Relation Age of Onset  . Parkinsonism Mother   . Hypertension Father   . Heart attack Father    No Known Allergies Current Outpatient Prescriptions on File Prior to Visit  Medication Sig Dispense Refill  . metFORMIN (GLUCOPHAGE-XR) 500 MG 24 hr tablet TAKE 1 TABLET (500 MG TOTAL) BY MOUTH DAILY WITH BREAKFAST. 90 tablet 1  . metoprolol succinate (TOPROL-XL) 50 MG 24 hr tablet TAKE 1 TABLET (50 MG TOTAL) BY MOUTH DAILY. TAKE WITH OR IMMEDIATELY FOLLOWING A MEAL. 90 tablet 3  . nystatin (MYCOSTATIN/NYSTOP) 100000 UNIT/GM POWD Apply to area up twice daily until rash resolves. 60 g 0  . omeprazole (PRILOSEC) 20 MG capsule Take 20 mg by mouth daily.    . ramipril (ALTACE) 5 MG capsule TAKE 1 CAPSULE (5 MG TOTAL) BY MOUTH DAILY. 90  capsule 3  . triamterene-hydrochlorothiazide (MAXZIDE-25) 37.5-25 MG per tablet Take 1 tablet by mouth daily. 90 tablet 3   No current facility-administered medications on file prior to visit.   The PMH, PSH, Social History, Family History, Medications, and allergies have been reviewed in [facility], and have been updated if relevant.   ROS: Review of Systems  Constitutional: Negative.   Eyes: Negative.   Respiratory: Negative.   Cardiovascular: Negative.   Gastrointestinal: Negative.   Genitourinary: Negative.   Musculoskeletal: Negative.   Skin: Negative.   Psychiatric/Behavioral: Negative.   All other systems reviewed and are negative.    Patient Active Problem List   Diagnosis Date Noted  . 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 Leukocytosis 01/18/2013    Priority: High  . Depression     Priority: High  . Hypertension     Priority: High  . GERD (gastroesophageal reflux disease)     Priority: Medium  . Peripheral neuropathy     Priority: Medium  . Elevated TSH 09/23/2014  . DJD (degenerative joint disease)   . Restless leg    Past Medical History  Diagnosis Date  . Depression   . Hypertension   . GERD (gastroesophageal reflux disease)   . Hyperglycemia   . DJD (degenerative joint disease)   . Restless leg   . Peripheral neuropathy   . Diabetes mellitus     borderline  . Syncope and collapse   . Blood transfusion without reported diagnosis   . Acute meniscal tear of left knee 01/19/2013   Past Surgical History  Procedure Laterality Date  . Right knee replacement  2008  . Tubal ligation  1976  . Appendectomy  1976  . Tonsillectomy  childhood  . Cardiac catheterization  2006    @ [facility]  . Multiple tooth extractions    . Joint replacement  06/2005    right total knee  . Umbilical hernia repair  07/21/2012    Procedure: HERNIA REPAIR UMBILICAL ADULT;  Surgeon: Adolph Pollack, MD;  [facility];  Service: General;   Laterality: N/A;  . Omentectomy  07/21/2012    Procedure: OMENTECTOMY;  Surgeon: Adolph Pollack, MD;  [facility];  Service: General;  Laterality: N/A;  . Knee arthroscopy with medial menisectomy Left 02/11/2013    Procedure: LEFT KNEE ARTHROSCOPY WITH MEDIAL MENISECTOMY;  Surgeon: Jacki Cones, MD;  Location[facility];  Service: Orthopedics;  Laterality: Left;   History  Substance Use Topics  . Smoking status: Former Smoker -- 1.00 packs/day  for 20 years    Types: Cigarettes  . Smokeless tobacco: Never Used  . Alcohol Use: 0.0 oz/week    1-2 Glasses of wine per week     Comment: ocassionally   Family History  Problem Relation Age of Onset  . Parkinsonism Mother   . Hypertension Father   . Heart attack Father    No Known Allergies Current Outpatient Prescriptions on File Prior to Visit  Medication Sig Dispense Refill  . metFORMIN (GLUCOPHAGE-XR) 500 MG 24 hr tablet TAKE 1 TABLET (500 MG TOTAL) BY MOUTH DAILY WITH BREAKFAST. 90 tablet 1  . metoprolol succinate (TOPROL-XL) 50 MG 24 hr tablet TAKE 1 TABLET (50 MG TOTAL) BY MOUTH DAILY. TAKE WITH OR IMMEDIATELY FOLLOWING A MEAL. 90 tablet 3  . nystatin (MYCOSTATIN/NYSTOP) 100000 UNIT/GM POWD Apply to area up twice daily until rash resolves. 60 g 0  . omeprazole (PRILOSEC) 20 MG capsule Take 20 mg by mouth daily.    . ramipril (ALTACE) 5 MG capsule TAKE 1 CAPSULE (5 MG TOTAL) BY MOUTH DAILY. 90 capsule 3  . triamterene-hydrochlorothiazide (MAXZIDE-25) 37.5-25 MG per tablet Take 1 tablet by mouth daily. 90 tablet 3   No current facility-administered medications on file prior to visit.    The PMH, PSH, Social History, Family History, Medications, and allergies have been reviewed in [facility], and have been updated if relevant.  Physical exam: BP 128/70 mmHg  Pulse 63  Temp(Src) 98 F (36.7 C) (Oral)  Wt 241 lb 8 oz (109.544 kg)  SpO2 95%  General:  Well-developed,overweight, well-nourished,in no acute distress;  alert,appropriate and cooperative throughout examination Head:  normocephalic and atraumatic.   Eyes:  vision grossly intact, pupils equal, pupils round, and pupils reactive to light.   Nose:  no external deformity.   Mouth:  good dentition.   Lungs:  Normal respiratory effort, chest expands symmetrically. Lungs are clear to auscultation, no crackles or wheezes. Heart:  Normal rate and regular rhythm. S1 and S2 normal without gallop, murmur, click, rub or other extra sounds. Abdomen:  Bowel sounds positive,abdomen soft and non-tender without masses, organomegaly or hernias noted. Msk:  No deformity or scoliosis noted of thoracic or lumbar spine.   Extremities:  No clubbing, cyanosis, edema, or deformity noted with normal full range of motion of all joints.   Neurologic:  alert & oriented X3 and gait normal.   Skin:  Intact without suspicious lesions or rashes, +multiple SK Psych:  Cognition and judgment appear intact. Alert and cooperative with normal attention span and concentration. No apparent delusions, illusions, hallucinations

## 2015-03-24 NOTE — Assessment & Plan Note (Signed)
Recheck thyroid panel today. Clinically euthyroid.

## 2015-03-25 ENCOUNTER — Encounter: Payer: Self-pay | Admitting: *Deleted

## 2015-03-28 ENCOUNTER — Other Ambulatory Visit: Payer: Self-pay | Admitting: Family Medicine

## 2015-06-30 DIAGNOSIS — H524 Presbyopia: Secondary | ICD-10-CM | POA: Diagnosis not present

## 2015-06-30 DIAGNOSIS — E119 Type 2 diabetes mellitus without complications: Secondary | ICD-10-CM | POA: Diagnosis not present

## 2015-06-30 DIAGNOSIS — H2513 Age-related nuclear cataract, bilateral: Secondary | ICD-10-CM | POA: Diagnosis not present

## 2015-06-30 DIAGNOSIS — H40023 Open angle with borderline findings, high risk, bilateral: Secondary | ICD-10-CM | POA: Diagnosis not present

## 2015-06-30 LAB — HM DIABETES EYE EXAM

## 2015-07-05 DIAGNOSIS — Z23 Encounter for immunization: Secondary | ICD-10-CM | POA: Diagnosis not present

## 2015-07-11 ENCOUNTER — Encounter: Payer: Self-pay | Admitting: Family Medicine

## 2015-09-20 ENCOUNTER — Other Ambulatory Visit: Payer: Self-pay | Admitting: *Deleted

## 2015-09-20 MED ORDER — METFORMIN HCL ER 500 MG PO TB24: ORAL_TABLET | ORAL | Status: DC

## 2015-10-06 ENCOUNTER — Other Ambulatory Visit: Payer: Self-pay | Admitting: Family Medicine

## 2015-10-07 ENCOUNTER — Other Ambulatory Visit: Payer: Self-pay | Admitting: Family Medicine

## 2015-12-17 ENCOUNTER — Other Ambulatory Visit: Payer: Self-pay | Admitting: Family Medicine

## 2015-12-26 ENCOUNTER — Ambulatory Visit (INDEPENDENT_AMBULATORY_CARE_PROVIDER_SITE_OTHER): Payer: Medicare HMO

## 2015-12-26 VITALS — BP 110/76 | HR 55 | Temp 98.5°F | Ht 62.5 in | Wt 232.5 lb

## 2015-12-26 DIAGNOSIS — Z0000 Encounter for general adult medical examination without abnormal findings: Secondary | ICD-10-CM

## 2015-12-26 DIAGNOSIS — Z1159 Encounter for screening for other viral diseases: Secondary | ICD-10-CM | POA: Diagnosis not present

## 2015-12-26 DIAGNOSIS — E1140 Type 2 diabetes mellitus with diabetic neuropathy, unspecified: Secondary | ICD-10-CM | POA: Diagnosis not present

## 2015-12-26 DIAGNOSIS — I10 Essential (primary) hypertension: Secondary | ICD-10-CM | POA: Diagnosis not present

## 2015-12-26 LAB — CBC WITH DIFFERENTIAL/PLATELET
BASOS PCT: 0.7 % (ref 0.0–3.0)
Basophils Absolute: 0.1 10*3/uL (ref 0.0–0.1)
EOS PCT: 2.3 % (ref 0.0–5.0)
Eosinophils Absolute: 0.3 10*3/uL (ref 0.0–0.7)
HCT: 41.8 % (ref 36.0–46.0)
Hemoglobin: 13.9 g/dL (ref 12.0–15.0)
LYMPHS ABS: 2.7 10*3/uL (ref 0.7–4.0)
Lymphocytes Relative: 24.8 % (ref 12.0–46.0)
MCHC: 33.4 g/dL (ref 30.0–36.0)
MCV: 87 fl (ref 78.0–100.0)
MONO ABS: 0.8 10*3/uL (ref 0.1–1.0)
MONOS PCT: 7 % (ref 3.0–12.0)
NEUTROS ABS: 7.2 10*3/uL (ref 1.4–7.7)
NEUTROS PCT: 65.2 % (ref 43.0–77.0)
Platelets: 289 10*3/uL (ref 150.0–400.0)
RBC: 4.8 Mil/uL (ref 3.87–5.11)
RDW: 13.9 % (ref 11.5–15.5)
WBC: 11 10*3/uL — ABNORMAL HIGH (ref 4.0–10.5)

## 2015-12-26 LAB — COMPREHENSIVE METABOLIC PANEL
ALT: 24 U/L (ref 0–35)
AST: 23 U/L (ref 0–37)
Albumin: 4.4 g/dL (ref 3.5–5.2)
Alkaline Phosphatase: 68 U/L (ref 39–117)
BILIRUBIN TOTAL: 0.6 mg/dL (ref 0.2–1.2)
BUN: 15 mg/dL (ref 6–23)
CALCIUM: 10.1 mg/dL (ref 8.4–10.5)
CHLORIDE: 104 meq/L (ref 96–112)
CO2: 26 meq/L (ref 19–32)
Creatinine, Ser: 0.87 mg/dL (ref 0.40–1.20)
GFR: 68.11 mL/min (ref >60.00)
GLUCOSE: 106 mg/dL — AB (ref 70–99)
POTASSIUM: 4.3 meq/L (ref 3.5–5.1)
Sodium: 138 mEq/L (ref 135–145)
Total Protein: 6.9 g/dL (ref 6.0–8.3)

## 2015-12-26 LAB — HEPATITIS C ANTIBODY: HCV Ab: NEGATIVE

## 2015-12-26 LAB — TSH: TSH: 1.54 u[IU]/mL (ref 0.35–4.50)

## 2015-12-26 LAB — HEMOGLOBIN A1C: Hgb A1c MFr Bld: 5.7 % (ref 4.6–6.5)

## 2015-12-26 NOTE — Patient Instructions (Signed)
Ms. [name] , Thank you for taking time to come for your Medicare Wellness Visit. I appreciate your ongoing commitment to your health goals. Please review the following plan we discussed and let me know if I can assist you in the future.   These are the goals we discussed: Goals    . Increase physical activity     Starting 12/26/2015, I will continue to exercise at [facility] for at least 60 min 3-4 days per week.        This is a list of the screening recommended for you and due dates:  Health Maintenance  Topic Date Due  . Complete foot exam   01/24/2016*  . Stool Blood Test  05/27/2016*  . Mammogram  10/21/2016*  . DEXA scan (bone density measurement)  10/21/2016*  . Tetanus Vaccine  12/25/2016*  . Colon Cancer Screening  12/25/2025*  . Flu Shot  03/27/2016  . Hemoglobin A1C  06/27/2016  . Eye exam for diabetics  06/29/2016  . Shingles Vaccine  Completed  .  Hepatitis C: One time screening is recommended by Center for Disease Control  (CDC) for  adults born from [birth year] through 1967.   Completed  . Pneumonia vaccines  Completed  *Topic was postponed. The date shown is not the original due date.    Preventive Care for Adults  A healthy lifestyle and preventive care can promote health and wellness. Preventive health guidelines for adults include the following key practices.  . A routine yearly physical is a good way to check with your health care provider about your health and preventive screening. It is a chance to share any concerns and updates on your health and to receive a thorough exam.  . Visit your dentist for a routine exam and preventive care every 6 months. Brush your teeth twice a day and floss once a day. Good oral hygiene prevents tooth decay and gum disease.  . The frequency of eye exams is based on your age, health, family medical history, use  of contact lenses, and other factors. Follow your health care provider's ecommendations for frequency of eye exams.  . Eat a  healthy diet. Foods like vegetables, fruits, whole grains, low-fat dairy products, and lean protein foods contain the nutrients you need without too many calories. Decrease your intake of foods high in solid fats, added sugars, and salt. Eat the right amount of calories for you. Get information about a proper diet from your health care provider, if necessary.  . Regular physical exercise is one of the most important things you can do for your health. Most adults should get at least 150 minutes of moderate-intensity exercise (any activity that increases your heart rate and causes you to sweat) each week. In addition, most adults need muscle-strengthening exercises on 2 or more days a week.  Silver Sneakers may be a benefit available to you. To determine eligibility, you may visit the website: www.silversneakers.com or contact program at (743)464-5748 Mon-Fri between 8AM-8PM.   . Maintain a healthy weight. The body mass index (BMI) is a screening tool to identify possible weight problems. It provides an estimate of body fat based on height and weight. Your health care provider can find your BMI and can help you achieve or maintain a healthy weight.   For adults 20 years and older: ? A BMI below 18.5 is considered underweight. ? A BMI of 18.5 to 24.9 is normal. ? A BMI of 25 to 29.9 is considered overweight. ? A BMI  of 30 and above is considered obese.   . Maintain normal blood lipids and cholesterol levels by exercising and minimizing your intake of saturated fat. Eat a balanced diet with plenty of fruit and vegetables. Blood tests for lipids and cholesterol should begin at age 72 and be repeated every 5 years. If your lipid or cholesterol levels are high, you are over 50, or you are at high risk for heart disease, you may need your cholesterol levels checked more frequently. Ongoing high lipid and cholesterol levels should be treated with medicines if diet and exercise are not working.  . If you  smoke, find out from your health care provider how to quit. If you do not use tobacco, please do not start.  . If you choose to drink alcohol, please do not consume more than 2 drinks per day. One drink is considered to be 12 ounces (355 mL) of beer, 5 ounces (148 mL) of wine, or 1.5 ounces (44 mL) of liquor.  . If you are 3055-815 years old, ask your health care provider if you should take aspirin to prevent strokes.  . Use sunscreen. Apply sunscreen liberally and repeatedly throughout the day. You should seek shade when your shadow is shorter than you. Protect yourself by wearing long sleeves, pants, a wide-brimmed hat, and sunglasses year round, whenever you are outdoors.  . Once a month, do a whole body skin exam, using a mirror to look at the skin on your back. Tell your health care provider of new moles, moles that have irregular borders, moles that are larger than a pencil eraser, or moles that have changed in shape or color.

## 2015-12-26 NOTE — Progress Notes (Signed)
I reviewed health advisor's note, was available for consultation, and agree with documentation and plan.  

## 2015-12-26 NOTE — Progress Notes (Signed)
Pre visit review using our clinic review tool, if applicable. No additional management support is needed unless otherwise documented below in the visit note. 

## 2015-12-26 NOTE — Progress Notes (Signed)
Subjective:   [name] is a 70 y.o. female who presents for Medicare Annual (Subsequent) preventive examination.  Review of Systems:  N/A Cardiac Risk Factors include: advanced age (>43men, >41 women);diabetes mellitus;hypertension;obesity (BMI >30kg/m2)     Objective:     Vitals: BP 110/76 mmHg  Pulse 55  Temp(Src) 98.5 F (36.9 C) (Oral)  Ht 5' 2.5" (1.588 m)  Wt 232 lb 8 oz (105.461 kg)  BMI 41.82 kg/m2  SpO2 96%  Body mass index is 41.82 kg/(m^2).   Tobacco History  Smoking status  . Former Smoker -- 1.00 packs/day for 20 years  . Types: Cigarettes  Smokeless tobacco  . Never Used     Counseling given: No   Past Medical History  Diagnosis Date  . Depression   . Hypertension   . GERD (gastroesophageal reflux disease)   . Hyperglycemia   . DJD (degenerative joint disease)   . Restless leg   . Peripheral neuropathy (HCC)   . Diabetes mellitus     borderline  . Syncope and collapse   . Blood transfusion without reported diagnosis   . Acute meniscal tear of left knee 01/19/2013   Past Surgical History  Procedure Laterality Date  . Right knee replacement  2008  . Tubal ligation  1976  . Appendectomy  1976  . Tonsillectomy  childhood  . Cardiac catheterization  2006    @ [facility]  . Multiple tooth extractions    . Joint replacement  06/2005    right total knee  . Umbilical hernia repair  07/21/2012    Procedure: HERNIA REPAIR UMBILICAL ADULT;  Surgeon: Adolph Pollack, MD;  Location: [facility];  Service: General;  Laterality: N/A;  . Omentectomy  07/21/2012    Procedure: OMENTECTOMY;  Surgeon: Adolph Pollack, MD;  Location: [facility];  Service: General;  Laterality: N/A;  . Knee arthroscopy with medial menisectomy Left 02/11/2013    Procedure: LEFT KNEE ARTHROSCOPY WITH MEDIAL MENISECTOMY;  Surgeon: Jacki Cones, MD;  Location: [facility];  Service: Orthopedics;  Laterality: Left;   Family History  Problem Relation Age of Onset  .  Parkinsonism Mother   . Hypertension Father   . Heart attack Father    History  Sexual Activity  . Sexual Activity: No    Outpatient Encounter Prescriptions as of 12/26/2015  Medication Sig  . aspirin 81 MG tablet Take 81 mg by mouth daily.  Marland Kitchen esomeprazole (NEXIUM) 20 MG capsule Take 20 mg by mouth every other day.  . metFORMIN (GLUCOPHAGE-XR) 500 MG 24 hr tablet TAKE 1 TABLET (500 MG TOTAL) BY MOUTH DAILY WITH BREAKFAST.  . metoprolol succinate (TOPROL-XL) 50 MG 24 hr tablet Take 1 tablet (50 mg total) by mouth daily. Take with or immediately following a meal. OV REQUIRED FOR ADDITIONAL REFILLS  . ramipril (ALTACE) 5 MG capsule TAKE 1 CAPSULE (5 MG TOTAL) BY MOUTH DAILY.  Marland Kitchen triamterene-hydrochlorothiazide (MAXZIDE-25) 37.5-25 MG tablet TAKE 1 TABLET BY MOUTH DAILY.  . [DISCONTINUED] nystatin (MYCOSTATIN/NYSTOP) 100000 UNIT/GM POWD Apply to area up twice daily until rash resolves.  . [DISCONTINUED] omeprazole (PRILOSEC) 20 MG capsule Take 20 mg by mouth daily.   No facility-administered encounter medications on file as of 12/26/2015.    Activities of Daily Living In your present state of health, do you have any difficulty performing the following activities: 12/26/2015  Hearing? N  Vision? N  Difficulty concentrating or making decisions? N  Walking or climbing stairs? N  Dressing or  bathing? N  Doing errands, shopping? N  Preparing Food and eating ? N  Using the Toilet? N  In the past six months, have you accidently leaked urine? N  Do you have problems with loss of bowel control? N  Managing your Medications? N  Managing your Finances? N  Housekeeping or managing your Housekeeping? N    Patient Care Team: Dianne Dun, MD as PCP - General (Family Medicine) Ranee Gosselin, MD as Consulting Physician (Orthopedic Surgery) Avel Peace, MD as Consulting Physician (General Surgery)    Assessment:    Vision Screening Comments: Next eye appt in May 2017. Sees eye doc every 6  mths.   Exercise Activities and Dietary recommendations Current Exercise Habits: Structured exercise class, Type of exercise: Other - see comments (stationary bike), Time (Minutes): 60, Frequency (Times/Week): 4, Weekly Exercise (Minutes/Week): 240, Intensity: Moderate, Exercise limited by: None identified Goals    . Increase physical activity     Starting 12/26/2015, I will continue to exercise at [facility] for at least 60 min 3-4 days per week.       Fall Risk Fall Risk  12/26/2015 09/23/2014 09/23/2014 09/30/2013  Falls in the past year? No No No No   Depression Screen PHQ 2/9 Scores 12/26/2015 09/23/2014 09/23/2014 09/30/2013  PHQ - 2 Score 6 0 0 0  PHQ- 9 Score 12 - - -     Cognitive Testing MMSE - Mini Mental State Exam 12/26/2015  Orientation to time 5  Orientation to Place 5  Registration 3  Attention/ Calculation 0  Recall 3  Language- name 2 objects 0  Language- repeat 1  Language- follow 3 step command 3  Language- read & follow direction 0  Write a sentence 0  Copy design 0  Total score 20   PLEASE NOTE: A Mini-Cog screen was completed. Maximum score is 20. A value of 0 denotes this part of Folstein MMSE was not completed.  Orientation to Time - Max 5 Orientation to Place - Max 5 Registration - Max 3 Recall - Max 3 Language Repeat - Max 1 Language Follow 3 Step Command - Max 3  Immunization History  Administered Date(s) Administered  . Influenza, High Dose Seasonal PF 07/05/2015  . Influenza,inj,Quad PF,36+ Mos 09/30/2013  . Pneumococcal Conjugate-13 09/23/2014  . Pneumococcal Polysaccharide-23 04/02/2012  . Zoster 09/30/2013   Screening Tests Health Maintenance  Topic Date Due  . FOOT EXAM  01/24/2016 (Originally 09/24/2015)  . COLON CANCER SCREENING ANNUAL FOBT  05/27/2016 (Originally 10/13/2015)  . MAMMOGRAM  10/21/2016 (Originally 10/22/2015)  . DEXA SCAN  10/21/2016 (Originally 06/01/2009)  . TETANUS/TDAP  12/25/2016 (Originally 06/02/1963)  . COLONOSCOPY   12/25/2025 (Originally 06/01/1994)  . INFLUENZA VACCINE  03/27/2016  . HEMOGLOBIN A1C  06/27/2016  . OPHTHALMOLOGY EXAM  06/29/2016  . ZOSTAVAX  Completed  . Hepatitis C Screening  Completed  . PNA vac Low Risk Adult  Completed      Plan:    I have personally reviewed and addressed the Medicare Annual Wellness questionnaire and have noted the following in the patient's chart:  A. Medical and social history B. Use of alcohol, tobacco or illicit drugs  C. Current medications and supplements D. Functional ability and status E.  Nutritional status F.  Physical activity G. Advance directives H. List of other physicians I.  Hospitalizations, surgeries, and ER visits in previous 12 months J.  Vitals K. Screenings to include hearing, vision, cognitive, depression L. Referrals and appointments - none  In addition,  I have reviewed and discussed with patient certain preventive protocols, quality metrics, and best practice recommendations. A written personalized care plan for preventive services as well as general preventive health recommendations were provided to patient.  See attached scanned questionnaire for additional information.   Signed,   Randa EvensLesia Omega Slager, MHA, BS, LPN Health Advisor 12/26/2015

## 2015-12-26 NOTE — Progress Notes (Signed)
PCP notes:  Positive depression screening. Patient verbalized and exhibited signs and symptoms of grief related to death of spouse and son-in-law. Patient denied suicidal ideation.   Patient has been scheduled both an acute visit and a CPE visit. Patient verbalized soreness in left foot, a strong urine odor at times, and an abnormal smell to vaginal area.   Patient also has brown skin growths around the breast area and has complaint of itching to breast area.

## 2015-12-27 ENCOUNTER — Ambulatory Visit (INDEPENDENT_AMBULATORY_CARE_PROVIDER_SITE_OTHER): Payer: Medicare HMO | Admitting: Family Medicine

## 2015-12-27 ENCOUNTER — Encounter: Payer: Self-pay | Admitting: Family Medicine

## 2015-12-27 VITALS — BP 120/70 | HR 67 | Temp 97.9°F | Ht 62.5 in | Wt 233.8 lb

## 2015-12-27 DIAGNOSIS — Z0001 Encounter for general adult medical examination with abnormal findings: Secondary | ICD-10-CM | POA: Diagnosis not present

## 2015-12-27 DIAGNOSIS — E1142 Type 2 diabetes mellitus with diabetic polyneuropathy: Secondary | ICD-10-CM | POA: Diagnosis not present

## 2015-12-27 DIAGNOSIS — Z01419 Encounter for gynecological examination (general) (routine) without abnormal findings: Secondary | ICD-10-CM | POA: Insufficient documentation

## 2015-12-27 DIAGNOSIS — R21 Rash and other nonspecific skin eruption: Secondary | ICD-10-CM | POA: Insufficient documentation

## 2015-12-27 DIAGNOSIS — I10 Essential (primary) hypertension: Secondary | ICD-10-CM

## 2015-12-27 DIAGNOSIS — R6889 Other general symptoms and signs: Secondary | ICD-10-CM

## 2015-12-27 DIAGNOSIS — M79672 Pain in left foot: Secondary | ICD-10-CM

## 2015-12-27 NOTE — Patient Instructions (Signed)
Great to see you. Please stop by to see Shirlee LimerickMarion on your way out.  [place] trip [place]

## 2015-12-27 NOTE — Assessment & Plan Note (Signed)
Well controlled. No changes made to rxs today. 

## 2015-12-27 NOTE — Progress Notes (Signed)
70 yo pleasant female here for CPX and  follow up of chronic medical conditions with several compalints.  Annual medicare wellness visit with Lu Duffel done on 12/26/15- notes reviewed.   Husband died in 04/16/23 unexpectedly, she feels she is coping ok.   Going [place] with her daughter and her husband loved that. She is looking forward to this.  Left foot pain- pain with walking on ball of left foot for months.   ? Rash- brown raised areas on her back and chest.  Have been there "forever' but sometimes get itchy.  DM-  Does not check FSBS regularly.  Compliant with her Metformin.   Lab Results  Component Value Date   HGBA1C 5.7 12/26/2015   +urine micro in 08/2014- does take ACEI daily.  HTN- takes Maxzide 1 tab daily, Toprol, and Ramipril 5 mg daily. Denies any blurred vision, CP, or SOB. No LE edema  Lab Results  Component Value Date   CREATININE 0.87 12/26/2015    Lab Results  Component Value Date   CHOL 132 03/24/2015   HDL 53.30 03/24/2015   LDLCALC 53 03/24/2015   TRIG 126.0 03/24/2015   CHOLHDL 2 03/24/2015    Patient Active Problem List   Diagnosis Date Noted  . Diabetes with neurologic complications (HCC) 01/18/2013    Priority: High  . Leukocytosis 01/18/2013    Priority: High  . Depression     Priority: High  . Hypertension     Priority: High  . GERD (gastroesophageal reflux disease)     Priority: Medium  . Peripheral neuropathy (HCC)     Priority: Medium  . Well woman exam 12/27/2015  . Rash and nonspecific skin eruption 12/27/2015  . Elevated TSH 09/23/2014  . DJD (degenerative joint disease)   . Restless leg    Past Medical History  Diagnosis Date  . Depression   . Hypertension   . GERD (gastroesophageal reflux disease)   . Hyperglycemia   . DJD (degenerative joint disease)   . Restless leg   . Peripheral neuropathy (HCC)   . Diabetes mellitus     borderline  . Syncope and collapse   . Blood transfusion without reported  diagnosis   . Acute meniscal tear of left knee 01/19/2013   Past Surgical History  Procedure Laterality Date  . Right knee replacement  2008  . Tubal ligation  1976  . Appendectomy  1976  . Tonsillectomy  childhood  . Cardiac catheterization  2006    @ [facility]  . Multiple tooth extractions    . Joint replacement  06/2005    right total knee  . Umbilical hernia repair  07/21/2012    Procedure: HERNIA REPAIR UMBILICAL ADULT;  Surgeon: Adolph Pollack, MD;  Location: [facility];  Service: General;  Laterality: N/A;  . Omentectomy  07/21/2012    Procedure: OMENTECTOMY;  Surgeon: Adolph Pollack, MD;  Location: [facility];  Service: General;  Laterality: N/A;  . Knee arthroscopy with medial menisectomy Left 02/11/2013    Procedure: LEFT KNEE ARTHROSCOPY WITH MEDIAL MENISECTOMY;  Surgeon: Jacki Cones, MD;  Location: [facility];  Service: Orthopedics;  Laterality: Left;   Social History  Substance Use Topics  . Smoking status: Former Smoker -- 1.00 packs/day for 20 years    Types: Cigarettes  . Smokeless tobacco: Never Used  . Alcohol Use: 0.0 oz/week    1-2 Glasses of wine per week     Comment: ocassionally   Family History  Problem Relation Age of Onset  . Parkinsonism Mother   . Hypertension Father   . Heart attack Father    No Known Allergies Current Outpatient Prescriptions on File Prior to Visit  Medication Sig Dispense Refill  . aspirin 81 MG tablet Take 81 mg by mouth daily.    Marland Kitchen. esomeprazole (NEXIUM) 20 MG capsule Take 20 mg by mouth every other day.    . metFORMIN (GLUCOPHAGE-XR) 500 MG 24 hr tablet TAKE 1 TABLET (500 MG TOTAL) BY MOUTH DAILY WITH BREAKFAST. 90 tablet 1  . metoprolol succinate (TOPROL-XL) 50 MG 24 hr tablet Take 1 tablet (50 mg total) by mouth daily. Take with or immediately following a meal. OV REQUIRED FOR ADDITIONAL REFILLS 90 tablet 0  . ramipril (ALTACE) 5 MG capsule TAKE 1 CAPSULE (5 MG TOTAL) BY MOUTH DAILY. 90 capsule 1  .  triamterene-hydrochlorothiazide (MAXZIDE-25) 37.5-25 MG tablet TAKE 1 TABLET BY MOUTH DAILY. 90 tablet 1   No current facility-administered medications on file prior to visit.   The PMH, PSH, Social History, Family History, Medications, and allergies [facility], and have been updated if relevant.   ROS: Review of Systems  Constitutional: Negative.   Eyes: Negative.   Respiratory: Negative.   Cardiovascular: Negative.   Gastrointestinal: Negative.   Genitourinary: Negative.   Musculoskeletal: Positive for arthralgias.  Skin: Positive for rash.  Psychiatric/Behavioral: Negative.   All other systems reviewed and are negative.    Patient Active Problem List   Diagnosis Date Noted  . Diabetes with neurologic complications (HCC) 01/18/2013    Priority: High  . Leukocytosis 01/18/2013    Priority: High  . Depression     Priority: High  . Hypertension     Priority: High  . GERD (gastroesophageal reflux disease)     Priority: Medium  . Peripheral neuropathy (HCC)     Priority: Medium  . Well woman exam 12/27/2015  . Rash and nonspecific skin eruption 12/27/2015  . Elevated TSH 09/23/2014  . DJD (degenerative joint disease)   . Restless leg    Past Medical History  Diagnosis Date  . Depression   . Hypertension   . GERD (gastroesophageal reflux disease)   . Hyperglycemia   . DJD (degenerative joint disease)   . Restless leg   . Peripheral neuropathy (HCC)   . Diabetes mellitus     borderline  . Syncope and collapse   . Blood transfusion without reported diagnosis   . Acute meniscal tear of left knee 01/19/2013   Past Surgical History  Procedure Laterality Date  . Right knee replacement  2008  . Tubal ligation  1976  . Appendectomy  1976  . Tonsillectomy  childhood  . [facility]  . Multiple tooth extractions    . Joint replacement  06/2005    right total knee  . Umbilical hernia repair  07/21/2012    Procedure:  HERNIA REPAIR UMBILICAL ADULT;  Surgeon: Adolph Pollackodd J Rosenbower, MD;  Location: [facility];  Service: General;  Laterality: N/A;  . Omentectomy  07/21/2012    Procedure: OMENTECTOMY;  Surgeon: Adolph Pollackodd J Rosenbower, MD;  Location: [facility];  Service: General;  Laterality: N/A;  . Knee arthroscopy with medial menisectomy Left 02/11/2013    Procedure: LEFT KNEE ARTHROSCOPY WITH MEDIAL MENISECTOMY;  Surgeon: Jacki Conesonald A Gioffre, MD;  Location: [facility];  Service: Orthopedics;  Laterality: Left;   Social History  Substance Use Topics  . Smoking status:  Former Smoker -- 1.00 packs/day for 20 years    Types: Cigarettes  . Smokeless tobacco: Never Used  . Alcohol Use: 0.0 oz/week    1-2 Glasses of wine per week     Comment: ocassionally   Family History  Problem Relation Age of Onset  . Parkinsonism Mother   . Hypertension Father   . Heart attack Father    No Known Allergies Current Outpatient Prescriptions on File Prior to Visit  Medication Sig Dispense Refill  . aspirin 81 MG tablet Take 81 mg by mouth daily.    Marland Kitchen esomeprazole (NEXIUM) 20 MG capsule Take 20 mg by mouth every other day.    . metFORMIN (GLUCOPHAGE-XR) 500 MG 24 hr tablet TAKE 1 TABLET (500 MG TOTAL) BY MOUTH DAILY WITH BREAKFAST. 90 tablet 1  . metoprolol succinate (TOPROL-XL) 50 MG 24 hr tablet Take 1 tablet (50 mg total) by mouth daily. Take with or immediately following a meal. OV REQUIRED FOR ADDITIONAL REFILLS 90 tablet 0  . ramipril (ALTACE) 5 MG capsule TAKE 1 CAPSULE (5 MG TOTAL) BY MOUTH DAILY. 90 capsule 1  . triamterene-hydrochlorothiazide (MAXZIDE-25) 37.5-25 MG tablet TAKE 1 TABLET BY MOUTH DAILY. 90 tablet 1   No current facility-administered medications on file prior to visit.    The PMH, PSH, Social History, Family History, Medications, and allergies have been reviewed in [facility], and have been updated if relevant.  Physical exam: BP 120/70 mmHg  Pulse 67  Temp(Src) 97.9 F (36.6 C) (Oral)  Ht 5' 2.5" (1.588 m)  Wt 233 lb  12 oz (106.028 kg)  BMI 42.05 kg/m2  General:  Well-developed,overweight, well-nourished,in no acute distress; alert,appropriate and cooperative throughout examination Head:  normocephalic and atraumatic.   Eyes:  vision grossly intact, pupils equal, pupils round, and pupils reactive to light.   Nose:  no external deformity.   Mouth:  good dentition.   Lungs:  Normal respiratory effort, chest expands symmetrically. Lungs are clear to auscultation, no crackles or wheezes. Heart:  Normal rate and regular rhythm. S1 and S2 normal without gallop, murmur, click, rub or other extra sounds. Abdomen:  Bowel sounds positive,abdomen soft and non-tender without masses, organomegaly or hernias noted. Msk:  No deformity or scoliosis noted of thoracic or lumbar spine.   Extremities:  No clubbing, cyanosis, edema, or deformity noted with normal full range of motion of all joints.   +callous on ball of left foot Neurologic:  alert & oriented X3 and gait normal.   Skin:  Intact without suspicious lesions or rashes, +multiple SK, no rashes Psych:  Cognition and judgment appear intact. Alert and cooperative with normal attention span and concentration. No apparent delusions, illusions, hallucinations

## 2015-12-27 NOTE — Assessment & Plan Note (Signed)
Reviewed preventive care protocols, scheduled due services, and updated immunizations Discussed nutrition, exercise, diet, and healthy lifestyle.  

## 2015-12-27 NOTE — Assessment & Plan Note (Signed)
Reassurance provided.  Not a rash- SKs, none look irritated today.

## 2015-12-27 NOTE — Progress Notes (Signed)
Pre visit review using our clinic review tool, if applicable. No additional management support is needed unless otherwise documented below in the visit note. 

## 2015-12-27 NOTE — Assessment & Plan Note (Signed)
New- refer to podiatry, re:  Callous in diabetic.

## 2015-12-28 ENCOUNTER — Telehealth: Payer: Self-pay | Admitting: *Deleted

## 2015-12-28 NOTE — Telephone Encounter (Signed)
Pt states she is trying to get an app[place]

## 2015-12-29 DIAGNOSIS — H40023 Open angle with borderline findings, high risk, bilateral: Secondary | ICD-10-CM | POA: Diagnosis not present

## 2016-01-03 ENCOUNTER — Encounter: Payer: Medicare HMO | Admitting: Family Medicine

## 2016-01-03 ENCOUNTER — Other Ambulatory Visit: Payer: Medicare HMO

## 2016-01-04 ENCOUNTER — Other Ambulatory Visit (INDEPENDENT_AMBULATORY_CARE_PROVIDER_SITE_OTHER): Payer: Medicare HMO

## 2016-01-04 ENCOUNTER — Encounter: Payer: Self-pay | Admitting: *Deleted

## 2016-01-04 ENCOUNTER — Other Ambulatory Visit: Payer: Self-pay | Admitting: Family Medicine

## 2016-01-04 DIAGNOSIS — Z1211 Encounter for screening for malignant neoplasm of colon: Secondary | ICD-10-CM

## 2016-01-04 LAB — FECAL OCCULT BLOOD, IMMUNOCHEMICAL: FECAL OCCULT BLD: NEGATIVE

## 2016-01-17 ENCOUNTER — Encounter: Payer: Self-pay | Admitting: Podiatry

## 2016-01-17 ENCOUNTER — Ambulatory Visit (INDEPENDENT_AMBULATORY_CARE_PROVIDER_SITE_OTHER): Payer: Medicare HMO | Admitting: Podiatry

## 2016-01-17 VITALS — BP 139/67 | HR 71 | Resp 18

## 2016-01-17 DIAGNOSIS — L84 Corns and callosities: Secondary | ICD-10-CM

## 2016-01-17 DIAGNOSIS — E1149 Type 2 diabetes mellitus with other diabetic neurological complication: Secondary | ICD-10-CM

## 2016-01-17 DIAGNOSIS — Q828 Other specified congenital malformations of skin: Secondary | ICD-10-CM | POA: Diagnosis not present

## 2016-01-17 NOTE — Patient Instructions (Signed)

## 2016-01-17 NOTE — Progress Notes (Signed)
   Subjective:    Patient ID: [name], female    DOB: 10/08/1945, 70 y.o.   MRN: [id number]  HPI   70 year old female presents the office today for concerns of the callus the ball of her left foot. She states that she is diabetic and has neuropathy. She denies any redness, drainage or any swelling from around the callus site. Left A1c was 5.1. She is not currently on treatment for neuropathy. Denies any claudication symptoms. No other complaints at this time.   Review of Systems  All other systems reviewed and are negative.      Objective:   Physical Exam General: AAO x3, NAD  Dermatological:  Left second metatarsal 2 hyperkeratotic lesion. Upon debridement no underlying ulceration, drainage of infection. No other open lesions or pre-ulcerative lesions are identified this time.  Vascular: Dorsalis Pedis artery and Posterior Tibial artery pedal pulses are 2/4 bilateral with immedate capillary fill time. Pedal hair growth present. No varicosities and no lower extremity edema present bilateral. There is no pain with calf compression, swelling, warmth, erythema.   Neruologic:  There is decreased sensation with [name] monofilament. Subjective there is numbness and tingling to her toes and she's previsit been diagnosed with neuropathy.  Musculoskeletal: No gross boney pedal deformities bilateral. No pain, crepitus, or limitation noted with foot and ankle range of motion bilateral. Muscular strength 5/5 in all groups tested bilateral.  Gait: Unassisted, Nonantalgic.      Assessment & Plan:   left foot the metatarsal to hyperkeratotic lesion, neuropathy -Treatment options discussed including all alternatives, risks, and complications -Etiology of symptoms were discussed -Hyperkeratotic lesion debrided 1 without complications or bleeding. Offloading pads were dispensed. Monitor for any further skin breakdown. Monitor for any clinical signs or symptoms of infection and  directed to call the office medially should any occur. -I discussed treatment options for neuropathy. [name] to proceed with compound cream which is ordered today. -Daily foot inspection discussed. -Follow-up as scheduled. Call the questions concerns meantime.  [name], DPM

## 2016-03-22 ENCOUNTER — Other Ambulatory Visit: Payer: Self-pay | Admitting: Family Medicine

## 2016-03-27 ENCOUNTER — Other Ambulatory Visit: Payer: Self-pay | Admitting: Family Medicine

## 2016-04-19 ENCOUNTER — Encounter: Payer: Self-pay | Admitting: Podiatry

## 2016-04-19 ENCOUNTER — Ambulatory Visit (INDEPENDENT_AMBULATORY_CARE_PROVIDER_SITE_OTHER): Payer: Medicare HMO | Admitting: Podiatry

## 2016-04-19 DIAGNOSIS — E1149 Type 2 diabetes mellitus with other diabetic neurological complication: Secondary | ICD-10-CM

## 2016-04-19 DIAGNOSIS — B351 Tinea unguium: Secondary | ICD-10-CM | POA: Diagnosis not present

## 2016-04-19 DIAGNOSIS — M79674 Pain in right toe(s): Secondary | ICD-10-CM | POA: Diagnosis not present

## 2016-04-19 DIAGNOSIS — M79675 Pain in left toe(s): Secondary | ICD-10-CM

## 2016-04-19 DIAGNOSIS — M79673 Pain in unspecified foot: Secondary | ICD-10-CM

## 2016-04-19 NOTE — Progress Notes (Signed)
Subjective: 70 y.o. returns the office today for painful, elongated, thickened toenails which she cannot trim herself. Denies any redness or drainage around the nails. She's been using the neuropathy compound cream which is not helping that much. She wishes to hold off on any oral treatment. She is inquiring about trying a medication to help thin her toenails. Denies any acute changes since last appointment and no new complaints today. Denies any systemic complaints such as fevers, chills, nausea, vomiting.   Objective: AAO 3, NAD DP/PT pulses palpable, CRT less than 3 seconds Nails hypertrophic, dystrophic, elongated, brittle, discolored 10. There is tenderness overlying the nails 1-5 bilaterally. There is no surrounding erythema or drainage along the nail sites. No open lesions or pre-ulcerative lesions are identified. No other areas of tenderness bilateral lower extremities. No overlying edema, erythema, increased warmth. No pain with calf compression, swelling, warmth, erythema.  Assessment: Patient presents with symptomatic onychomycosis  Plan: -Treatment options including alternatives, risks, complications were discussed -Nails sharply debrided 10without complication/bleeding. -She is still getting some pins and needle sensation to her feet. Continue compound cream. Discussed that as she wishes to hold off. -Recommended urea cream for toenails. -Discussed daily foot inspection. If there are any changes, to call the office immediately.  -Follow-up in 3 months or sooner if any problems are to arise. In the meantime, encouraged to call the office with any questions, concerns, changes symptoms.  Ovid CurdMatthew Wagoner, DPM

## 2016-07-16 ENCOUNTER — Encounter: Payer: Self-pay | Admitting: Family Medicine

## 2016-07-16 DIAGNOSIS — H5213 Myopia, bilateral: Secondary | ICD-10-CM | POA: Diagnosis not present

## 2016-07-16 LAB — HM DIABETES EYE EXAM

## 2016-07-23 ENCOUNTER — Encounter: Payer: Self-pay | Admitting: Podiatry

## 2016-07-23 ENCOUNTER — Ambulatory Visit (INDEPENDENT_AMBULATORY_CARE_PROVIDER_SITE_OTHER): Payer: Medicare HMO | Admitting: Podiatry

## 2016-07-23 VITALS — Ht 62.5 in | Wt 233.0 lb

## 2016-07-23 DIAGNOSIS — M79674 Pain in right toe(s): Secondary | ICD-10-CM

## 2016-07-23 DIAGNOSIS — M79675 Pain in left toe(s): Secondary | ICD-10-CM

## 2016-07-23 DIAGNOSIS — B351 Tinea unguium: Secondary | ICD-10-CM

## 2016-07-23 DIAGNOSIS — E1149 Type 2 diabetes mellitus with other diabetic neurological complication: Secondary | ICD-10-CM | POA: Diagnosis not present

## 2016-07-23 NOTE — Progress Notes (Signed)
Complaint:  Visit Type: Patient returns to my office for continued preventative foot care services. Complaint: Patient states" my nails have grown long and thick and become painful to walk and wear shoes" Patient has been diagnosed with DM with no foot complications. The patient presents for preventative foot care services. No changes to ROS  Podiatric Exam: Vascular: dorsalis pedis and posterior tibial pulses are palpable bilateral. Capillary return is immediate. Temperature gradient is WNL. Skin turgor WNL  Sensorium: Diminished  Semmes Weinstein monofilament test. Normal tactile sensation bilaterally. Nail Exam: Pt has thick disfigured discolored nails with subungual debris noted bilateral entire nail hallux through fifth toenails Ulcer Exam: There is no evidence of ulcer or pre-ulcerative changes or infection. Orthopedic Exam: Muscle tone and strength are WNL. No limitations in general ROM. No crepitus or effusions noted. Foot type and digits show no abnormalities. Bony prominences are unremarkable. Skin: No Porokeratosis. No infection or ulcers  Diagnosis:  Onychomycosis, , Pain in right toe, pain in left toes  Treatment & Plan Procedures and Treatment: Consent by patient was obtained for treatment procedures. The patient understood the discussion of treatment and procedures well. All questions were answered thoroughly reviewed. Debridement of mycotic and hypertrophic toenails, 1 through 5 bilateral and clearing of subungual debris. No ulceration, no infection noted.  Return Visit-Office Procedure: Patient instructed to return to the office for a follow up visit 3 months for continued evaluation and treatment.    Marshal Schrecengost DPM 

## 2016-07-24 ENCOUNTER — Ambulatory Visit: Payer: Medicare HMO | Admitting: Podiatry

## 2016-12-27 ENCOUNTER — Encounter: Payer: Self-pay | Admitting: Family Medicine

## 2016-12-27 ENCOUNTER — Ambulatory Visit (INDEPENDENT_AMBULATORY_CARE_PROVIDER_SITE_OTHER): Payer: Medicare HMO | Admitting: Family Medicine

## 2016-12-27 VITALS — BP 120/78 | HR 76 | Temp 98.1°F | Ht 62.75 in | Wt 240.0 lb

## 2016-12-27 DIAGNOSIS — Z0000 Encounter for general adult medical examination without abnormal findings: Secondary | ICD-10-CM | POA: Diagnosis not present

## 2016-12-27 DIAGNOSIS — K219 Gastro-esophageal reflux disease without esophagitis: Secondary | ICD-10-CM

## 2016-12-27 DIAGNOSIS — Z01419 Encounter for gynecological examination (general) (routine) without abnormal findings: Secondary | ICD-10-CM

## 2016-12-27 DIAGNOSIS — R69 Illness, unspecified: Secondary | ICD-10-CM | POA: Diagnosis not present

## 2016-12-27 DIAGNOSIS — F329 Major depressive disorder, single episode, unspecified: Secondary | ICD-10-CM

## 2016-12-27 DIAGNOSIS — F32A Depression, unspecified: Secondary | ICD-10-CM

## 2016-12-27 DIAGNOSIS — E1142 Type 2 diabetes mellitus with diabetic polyneuropathy: Secondary | ICD-10-CM | POA: Diagnosis not present

## 2016-12-27 DIAGNOSIS — I10 Essential (primary) hypertension: Secondary | ICD-10-CM

## 2016-12-27 LAB — CBC WITH DIFFERENTIAL/PLATELET
BASOS ABS: 0.1 10*3/uL (ref 0.0–0.1)
Basophils Relative: 0.9 % (ref 0.0–3.0)
EOS ABS: 0.2 10*3/uL (ref 0.0–0.7)
Eosinophils Relative: 2.4 % (ref 0.0–5.0)
HEMATOCRIT: 42.4 % (ref 36.0–46.0)
Hemoglobin: 14 g/dL (ref 12.0–15.0)
LYMPHS ABS: 2.6 10*3/uL (ref 0.7–4.0)
LYMPHS PCT: 27.5 % (ref 12.0–46.0)
MCHC: 33 g/dL (ref 30.0–36.0)
MCV: 88.8 fl (ref 78.0–100.0)
Monocytes Absolute: 0.9 10*3/uL (ref 0.1–1.0)
Monocytes Relative: 9.7 % (ref 3.0–12.0)
NEUTROS PCT: 59.5 % (ref 43.0–77.0)
Neutro Abs: 5.7 10*3/uL (ref 1.4–7.7)
PLATELETS: 266 10*3/uL (ref 150.0–400.0)
RBC: 4.78 Mil/uL (ref 3.87–5.11)
RDW: 13.7 % (ref 11.5–15.5)
WBC: 9.6 10*3/uL (ref 4.0–10.5)

## 2016-12-27 LAB — LIPID PANEL
Cholesterol: 138 mg/dL (ref 0–200)
HDL: 58.5 mg/dL (ref >39.00)
LDL Cholesterol: 48 mg/dL (ref 0–99)
NonHDL: 79.25
TRIGLYCERIDES: 154 mg/dL — AB (ref 0.0–149.0)
Total CHOL/HDL Ratio: 2
VLDL: 30.8 mg/dL (ref 0.0–40.0)

## 2016-12-27 LAB — HEMOGLOBIN A1C: Hgb A1c MFr Bld: 5.6 % (ref 4.6–6.5)

## 2016-12-27 LAB — TSH: TSH: 2.86 u[IU]/mL (ref 0.35–4.50)

## 2016-12-27 MED ORDER — METOPROLOL SUCCINATE ER 50 MG PO TB24: ORAL_TABLET | ORAL | 3 refills | Status: DC

## 2016-12-27 MED ORDER — METFORMIN HCL ER 500 MG PO TB24: ORAL_TABLET | ORAL | 3 refills | Status: DC

## 2016-12-27 MED ORDER — TRIAMTERENE-HCTZ 37.5-25 MG PO TABS: 1.0000 | ORAL_TABLET | Freq: Every day | ORAL | 3 refills | Status: DC

## 2016-12-27 MED ORDER — RAMIPRIL 5 MG PO CAPS: ORAL_CAPSULE | ORAL | 3 refills | Status: DC

## 2016-12-27 NOTE — Progress Notes (Signed)
Pre visit review using our clinic review tool, if applicable. No additional management support is needed unless otherwise documented below in the visit note. 

## 2016-12-27 NOTE — Assessment & Plan Note (Signed)
Deteriorated. Pt is refusing rx or pscyh referral. She feels "this is normal when you've lost your husband."

## 2016-12-27 NOTE — Progress Notes (Signed)
Subjective:   Patient ID: [name], female    DOB: 04-02-1946, 71 y.o.   MRN: [id number]  [name] is a pleasant 71 y.o. year old female who presents to clinic today with Medicare Wellness; Fatigue; Shortness of Breath; and Depression  on 12/27/2016  HPI:   71 yo pleasant female here for annual medicare wellness visit and to follow up chronic medical conditions and to discuss acute issues.  I have personally reviewed the Medicare Annual Wellness questionnaire and have noted 1.         The patient's medical and social history 2.         Their use of alcohol, tobacco or illicit drugs 3.         Their current medications and supplements 4.         The patient's functional ability including ADL's, fall risks, home safety risks and hearing or visual             impairment. 5.         Diet and physical activities 6.         Evidence for depression or mood disorders  End of life wishes discussed and updated in Social History.  The roster of all physicians providing medical care to patient - is listed in the Snapshot section of the chart.   Last pap smear done by me in 02/2011.  No h/o postmenopausal bleeding. Has not had colonoscopy- IFOB last done in 01/04/16- neg  Denies any changes in her bowel habits or blood in her stool.  Last mammogram 10/21/14  Zoster 09/30/13 Pneumovax 04/02/12   DM-  Does not check FSBS regularly.  Compliant with her Metformin.   Lab Results  Component Value Date   HGBA1C 5.7 12/26/2015   Lab Results  Component Value Date   CHOL 132 03/24/2015   HDL 53.30 03/24/2015   LDLCALC 53 03/24/2015   TRIG 126.0 03/24/2015   CHOLHDL 2 03/24/2015   Depression- PHQ 9 off 22- feels sad, tired, decreased appetite, trouble concentrating, tearful.  Denies SI or HI. She feels she has been intermittently feeling this way since her husband died.  Current Outpatient Prescriptions on File Prior to Visit  Medication Sig Dispense Refill  . aspirin  81 MG tablet Take 81 mg by mouth daily.    Marland Kitchen esomeprazole (NEXIUM) 20 MG capsule Take 20 mg by mouth every other day.    . metFORMIN (GLUCOPHAGE-XR) 500 MG 24 hr tablet TAKE 1 TABLET (500 MG TOTAL) BY MOUTH DAILY WITH BREAKFAST. 90 tablet 3  . metoprolol succinate (TOPROL-XL) 50 MG 24 hr tablet TAKE 1 TABLET BY MOUTH DAILY WITH OR IMMEDIATELY FOLLOWING A MEAL. OFFICE VISIT REQUIRED FOR REFILLS 90 tablet 2  . ramipril (ALTACE) 5 MG capsule TAKE 1 CAPSULE (5 MG TOTAL) BY MOUTH DAILY. 90 capsule 2  . triamterene-hydrochlorothiazide (MAXZIDE-25) 37.5-25 MG tablet TAKE 1 TABLET BY MOUTH DAILY. 90 tablet 2   No current facility-administered medications on file prior to visit.     No Known Allergies  Past Medical History:  Diagnosis Date  . Acute meniscal tear of left knee 01/19/2013  . Blood transfusion without reported diagnosis   . Depression   . Diabetes mellitus    borderline  . DJD (degenerative joint disease)   . GERD (gastroesophageal reflux disease)   . Hyperglycemia   . Hypertension   . Peripheral neuropathy   . Restless leg   . Syncope and collapse  Past Surgical History:  Procedure Laterality Date  . APPENDECTOMY  1976  . CARDIAC CATHETERIZATION  2006   @ [facility]  . 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 KNEE ARTHROSCOPY WITH MEDIAL MENISECTOMY Left 02/11/2013   Procedure: LEFT KNEE ARTHROSCOPY WITH MEDIAL MENISECTOMY;  Surgeon: Jacki Conesonald A Gioffre, MD;  Location: [facility];  Service: Orthopedics;  Laterality: Left;  Marland Kitchen. MULTIPLE TOOTH EXTRACTIONS    . OMENTECTOMY  07/21/2012   Procedure: OMENTECTOMY;  Surgeon: Adolph Pollackodd J Rosenbower, MD;  Location: [facility];  Service: General;  Laterality: N/A;  . right knee replacement  2008  . TONSILLECTOMY  childhood  . TUBAL LIGATION  1976  . UMBILICAL HERNIA REPAIR  07/21/2012   Procedure: HERNIA REPAIR UMBILICAL ADULT;  Surgeon: Adolph Pollackodd J Rosenbower, MD;  Location: [facility];  Service: General;  Laterality: N/A;    Family History    Problem Relation Age of Onset  . Parkinsonism Mother   . Hypertension Father   . Heart attack Father     Social History   Social History  . Marital status: Married    Spouse name: N/A  . Number of children: 2  . Years of education: N/A   Occupational History  . retired    Social History Main Topics  . Smoking status: Former Smoker    Packs/day: 1.00    Years: 20.00    Types: Cigarettes  . Smokeless tobacco: Never Used  . Alcohol use 0.0 oz/week    1 - 2 Glasses of wine per week     Comment: ocassionally  . Drug use: No  . Sexual activity: No   Other Topics Concern  . Not on file   Social History Narrative   Patient would desire CPR.   Would not want feeding tubes or heroic measures.   Does not have HPOA.   The PMH, PSH, Social History, Family History, Medications, and allergies [facility], and have been updated if relevant.  Review of Systems  Constitutional: Positive for fatigue.  HENT: Negative.   Eyes: Negative.   Respiratory: Negative.   Cardiovascular: Negative.   Endocrine: Negative.   Genitourinary: Negative.   Musculoskeletal: Negative.   Allergic/Immunologic: Negative.   Neurological: Negative.   Hematological: Negative.   Psychiatric/Behavioral: Positive for confusion, decreased concentration, dysphoric mood and sleep disturbance. Negative for agitation, behavioral problems, hallucinations, self-injury and suicidal ideas. The patient is nervous/anxious. The patient is not hyperactive.        See PHQ  All other systems reviewed and are negative.      Objective:    BP 120/78 (BP Location: Left Arm, Patient Position: Sitting, Cuff Size: Large)   Pulse 76   Temp 98.1 F (36.7 C) (Oral)   Ht 5' 2.75" (1.594 m)   Wt 240 lb (108.9 kg)   SpO2 95%   BMI 42.85 kg/m   Wt Readings from Last 3 Encounters:  12/27/16 240 lb (108.9 kg)  07/23/16 233 lb (105.7 kg)  12/27/15 233 lb 12 oz (106 kg)    Physical Exam    General:   Well-developed,well-nourished,in no acute distress; alert,appropriate and cooperative throughout examination Head:  normocephalic and atraumatic.   Eyes:  vision grossly intact, PERRL Ears:  R ear normal and L ear normal externally, TMs clear bilaterally Nose:  no external deformity.   Mouth:  good dentition.   Neck:  No deformities, masses, or tenderness noted. Breasts:  No mass, nodules, thickening, tenderness, bulging, retraction,  inflamation, nipple discharge or skin changes noted.   Lungs:  Normal respiratory effort, chest expands symmetrically. Lungs are clear to auscultation, no crackles or wheezes. Heart:  Normal rate and regular rhythm. S1 and S2 normal without gallop, murmur, click, rub or other extra sounds. Abdomen:  Bowel sounds positive,abdomen soft and non-tender without masses, organomegaly or hernias noted. Msk:  No deformity or scoliosis noted of thoracic or lumbar spine.   Extremities:  No clubbing, cyanosis, edema, or deformity noted with normal full range of motion of all joints.   Neurologic:  alert & oriented X3 and gait normal.   Skin:  Intact without suspicious lesions or rashes Cervical Nodes:  No lymphadenopathy noted Axillary Nodes:  No palpable lymphadenopathy Psych:  Cognition and judgment appear intact. Alert and cooperative with normal attention span and concentration. No apparent delusions, illusions, hallucinations      Assessment & Plan:   Well woman exam  Medicare annual wellness visit, subsequent  Gastroesophageal reflux disease, esophagitis presence not specified  Type 2 diabetes mellitus with diabetic polyneuropathy, without long-term current use of insulin (HCC)  Depression, unspecified depression type  Essential hypertension No Follow-up on file.

## 2016-12-27 NOTE — Patient Instructions (Signed)
Great to see you. We will call you with your lab results and you can view them online.  

## 2016-12-27 NOTE — Assessment & Plan Note (Signed)
Continue current rxs Labs today. 

## 2016-12-27 NOTE — Assessment & Plan Note (Signed)
Well controlled.  No changes made. 

## 2016-12-27 NOTE — Assessment & Plan Note (Signed)
Reviewed preventive care protocols, scheduled due services, and updated immunizations Discussed nutrition, exercise, diet, and healthy lifestyle.  Refusing mammography. Breast exam done today. Cologuard ordered for her as well today.  Orders Placed This Encounter  Procedures  . CBC with Differential/Platelet  . Lipid panel  . TSH  . Hemoglobin A1c

## 2016-12-27 NOTE — Assessment & Plan Note (Addendum)
The patients weight, height, BMI and visual acuity have been recorded in the chart.  Cognitive function assessed.   I have made referrals, counseling and provided education to the patient based review of the above and I have provided the pt with a written personalized care plan for preventive services.  

## 2017-01-01 ENCOUNTER — Encounter: Payer: Self-pay | Admitting: Family Medicine

## 2017-01-01 DIAGNOSIS — Z1212 Encounter for screening for malignant neoplasm of rectum: Secondary | ICD-10-CM | POA: Diagnosis not present

## 2017-01-01 DIAGNOSIS — Z1211 Encounter for screening for malignant neoplasm of colon: Secondary | ICD-10-CM | POA: Diagnosis not present

## 2017-01-07 LAB — COLOGUARD

## 2017-01-10 ENCOUNTER — Other Ambulatory Visit: Payer: Self-pay | Admitting: Family Medicine

## 2017-01-14 DIAGNOSIS — H401434 Capsular glaucoma with pseudoexfoliation of lens, bilateral, indeterminate stage: Secondary | ICD-10-CM | POA: Diagnosis not present

## 2017-01-14 DIAGNOSIS — H40023 Open angle with borderline findings, high risk, bilateral: Secondary | ICD-10-CM | POA: Diagnosis not present

## 2017-01-15 ENCOUNTER — Ambulatory Visit (INDEPENDENT_AMBULATORY_CARE_PROVIDER_SITE_OTHER)
Admission: RE | Admit: 2017-01-15 | Discharge: 2017-01-15 | Disposition: A | Discharge status: Home / Self Care | Payer: Medicare HMO | Source: Ambulatory Visit | Attending: Family Medicine | Admitting: Family Medicine

## 2017-01-15 ENCOUNTER — Encounter: Payer: Self-pay | Admitting: Family Medicine

## 2017-01-15 ENCOUNTER — Ambulatory Visit (INDEPENDENT_AMBULATORY_CARE_PROVIDER_SITE_OTHER): Payer: Medicare HMO | Admitting: Family Medicine

## 2017-01-15 DIAGNOSIS — M79605 Pain in left leg: Secondary | ICD-10-CM | POA: Diagnosis not present

## 2017-01-15 DIAGNOSIS — S8992XA Unspecified injury of left lower leg, initial encounter: Secondary | ICD-10-CM

## 2017-01-15 DIAGNOSIS — M79672 Pain in left foot: Secondary | ICD-10-CM | POA: Diagnosis not present

## 2017-01-15 DIAGNOSIS — S99922A Unspecified injury of left foot, initial encounter: Secondary | ICD-10-CM | POA: Diagnosis not present

## 2017-01-15 NOTE — Progress Notes (Signed)
Pre visit review using our clinic review tool, if applicable. No additional management support is needed unless otherwise documented below in the visit note. 

## 2017-01-15 NOTE — Progress Notes (Signed)
SUBJECTIVE: [name] is a 71 y.o. female who sustained a left foot injury 1 week(s) ago. Mechanism of injury: tripped over her flip flop and fell forward. Immediate symptoms: immediate pain, immediate swelling, inability to bear weight directly after injury. Symptoms have been gradual since that time. Prior history of related problems: no prior problems with this area in the past.  Current Outpatient Prescriptions on File Prior to Visit  Medication Sig Dispense Refill  . aspirin 81 MG tablet Take 81 mg by mouth daily.    Marland Kitchen esomeprazole (NEXIUM) 20 MG capsule Take 20 mg by mouth every other day.    . metFORMIN (GLUCOPHAGE-XR) 500 MG 24 hr tablet TAKE 1 TABLET (500 MG TOTAL) BY MOUTH DAILY WITH BREAKFAST. 90 tablet 3  . metoprolol succinate (TOPROL-XL) 50 MG 24 hr tablet TAKE 1 TABLET BY MOUTH DAILY WITH OR IMMEDIATELY FOLLOWING A MEAL. OFFICE VISIT REQUIRED FOR REFILLS 90 tablet 3  . ramipril (ALTACE) 5 MG capsule TAKE 1 CAPSULE (5 MG TOTAL) BY MOUTH DAILY. 90 capsule 3  . triamterene-hydrochlorothiazide (MAXZIDE-25) 37.5-25 MG tablet Take 1 tablet by mouth daily. 90 tablet 3   No current facility-administered medications on file prior to visit.     No Known Allergies  Past Medical History:  Diagnosis Date  . Acute meniscal tear of left knee 01/19/2013  . Blood transfusion without reported diagnosis   . Depression   . Diabetes mellitus    borderline  . DJD (degenerative joint disease)   . GERD (gastroesophageal reflux disease)   . Hyperglycemia   . Hypertension   . Peripheral neuropathy   . Restless leg   . Syncope and collapse     Past Surgical History:  Procedure Laterality Date  . APPENDECTOMY  1976  . CARDIAC CATHETERIZATION  2006   @ [place]  . 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 KNEE ARTHROSCOPY WITH MEDIAL MENISECTOMY Left 02/11/2013   Procedure: LEFT KNEE ARTHROSCOPY WITH MEDIAL MENISECTOMY;  Surgeon: Jacki Cones, MD;  Location: [facility];   Service: Orthopedics;  Laterality: Left;  Marland Kitchen MULTIPLE TOOTH EXTRACTIONS    . OMENTECTOMY  07/21/2012   Procedure: OMENTECTOMY;  Surgeon: Adolph Pollack, MD;  Location: [facility];  Service: General;  Laterality: N/A;  . right knee replacement  2008  . TONSILLECTOMY  childhood  . TUBAL LIGATION  1976  . UMBILICAL HERNIA REPAIR  07/21/2012   Procedure: HERNIA REPAIR UMBILICAL ADULT;  Surgeon: Adolph Pollack, MD;  Location: [facility];  Service: General;  Laterality: N/A;    Family History  Problem Relation Age of Onset  . Parkinsonism Mother   . Hypertension Father   . Heart attack Father     Social History   Social History  . Marital status: Married    Spouse name: N/A  . Number of children: 2  . Years of education: N/A   Occupational History  . retired    Social History Main Topics  . Smoking status: Former Smoker    Packs/day: 1.00    Years: 20.00    Types: Cigarettes  . Smokeless tobacco: Never Used  . Alcohol use 0.0 oz/week    1 - 2 Glasses of wine per week     Comment: ocassionally  . Drug use: No  . Sexual activity: No   Other Topics Concern  . Not on file   Social History Narrative   Patient would desire CPR.   Would not want feeding  tubes or heroic measures.   Does not have HPOA.   The PMH, PSH, Social History, Family History, Medications, and allergies [facility], and have been updated if relevant.  OBJECTIVE: BP 120/70   Pulse 71   Temp 98.1 F (36.7 C)   Wt 238 lb (108 kg)   SpO2 98%   BMI 42.50 kg/m   Vital signs as noted above. Appearance: alert, well appearing, and in no distress and oriented to person, place, and time. Foot/ankle exam: soft tissue swelling and tenderness over the base of 5th metatarsal, ecchymoses and swelling of toes X-ray: ordered, but results not yet available.  ASSESSMENT: foot contusion/rule out fracture  PLAN: rest the injured area as much as practical, X-Ray ordered See orders for this visit as  documented in the electronic medical record.

## 2017-01-16 ENCOUNTER — Telehealth: Payer: Self-pay

## 2017-01-16 NOTE — Telephone Encounter (Signed)
Pt was notified of cologuard results, pt verbalized understanding.   

## 2017-07-04 ENCOUNTER — Encounter (HOSPITAL_COMMUNITY): Payer: Self-pay

## 2017-07-04 ENCOUNTER — Emergency Department (HOSPITAL_COMMUNITY)
Admission: EM | Admit: 2017-07-04 | Discharge: 2017-07-05 | Disposition: A | Discharge status: Home / Self Care | Payer: Medicare HMO | Attending: Emergency Medicine | Admitting: Emergency Medicine

## 2017-07-04 DIAGNOSIS — N134 Hydroureter: Secondary | ICD-10-CM | POA: Diagnosis not present

## 2017-07-04 DIAGNOSIS — N200 Calculus of kidney: Secondary | ICD-10-CM | POA: Diagnosis not present

## 2017-07-04 DIAGNOSIS — Z7982 Long term (current) use of aspirin: Secondary | ICD-10-CM | POA: Diagnosis not present

## 2017-07-04 DIAGNOSIS — Z87891 Personal history of nicotine dependence: Secondary | ICD-10-CM | POA: Insufficient documentation

## 2017-07-04 DIAGNOSIS — E119 Type 2 diabetes mellitus without complications: Secondary | ICD-10-CM | POA: Insufficient documentation

## 2017-07-04 DIAGNOSIS — N132 Hydronephrosis with renal and ureteral calculous obstruction: Secondary | ICD-10-CM | POA: Diagnosis not present

## 2017-07-04 DIAGNOSIS — I10 Essential (primary) hypertension: Secondary | ICD-10-CM | POA: Diagnosis not present

## 2017-07-04 DIAGNOSIS — R1031 Right lower quadrant pain: Secondary | ICD-10-CM | POA: Diagnosis present

## 2017-07-04 DIAGNOSIS — R109 Unspecified abdominal pain: Secondary | ICD-10-CM | POA: Diagnosis not present

## 2017-07-04 DIAGNOSIS — R112 Nausea with vomiting, unspecified: Secondary | ICD-10-CM | POA: Diagnosis not present

## 2017-07-04 DIAGNOSIS — Z7984 Long term (current) use of oral hypoglycemic drugs: Secondary | ICD-10-CM | POA: Insufficient documentation

## 2017-07-04 DIAGNOSIS — Z79899 Other long term (current) drug therapy: Secondary | ICD-10-CM | POA: Diagnosis not present

## 2017-07-04 LAB — URINALYSIS, ROUTINE W REFLEX MICROSCOPIC
BILIRUBIN URINE: NEGATIVE
Glucose, UA: NEGATIVE mg/dL
HGB URINE DIPSTICK: NEGATIVE
Ketones, ur: NEGATIVE mg/dL
Nitrite: NEGATIVE
PROTEIN: NEGATIVE mg/dL
Specific Gravity, Urine: 1.013 (ref 1.005–1.030)
pH: 5 (ref 5.0–8.0)

## 2017-07-04 MED ORDER — OXYCODONE-ACETAMINOPHEN 5-325 MG PO TABS
1.0000 | ORAL_TABLET | ORAL | Status: DC | PRN
  Administered 2017-07-04: 1 via ORAL
  Filled 2017-07-04: qty 1

## 2017-07-04 MED ORDER — SODIUM CHLORIDE 0.9 % IV BOLUS (SEPSIS)
500.0000 mL | Freq: Once | INTRAVENOUS | Status: AC
  Administered 2017-07-05: 500 mL via INTRAVENOUS

## 2017-07-04 NOTE — ED Provider Notes (Signed)
TIME SEEN: 11:55 PM  CHIEF COMPLAINT: Right flank pain, vomiting  HPI: Patient is a 71 year old female with history of borderline diabetes, hypertension who presents to the emergency department sudden onset right-sided flank pain that started today.  Describes it as sharp, severe that radiated into the right lower quadrant.  Associated with nausea and vomiting.  No fevers, diarrhea.  Having normal bowel movements.  Has been taking MiraLAX recently for constipation but states this has improved.  No dysuria or hematuria.  No injury to the back.  No numbness or focal weakness.  Given Percocet in the waiting room and states she is feeling much better.  No prior history of kidney stones or kidney infection.  ROS: See HPI Constitutional: no fever  Eyes: no drainage  ENT: no runny nose   Cardiovascular:  no chest pain  Resp: no SOB  GI:  vomiting GU: no dysuria Integumentary: no rash  Allergy: no hives  Musculoskeletal: no leg swelling  Neurological: no slurred speech ROS otherwise negative  PAST MEDICAL HISTORY/PAST SURGICAL HISTORY:  Past Medical History:  Diagnosis Date  . Acute meniscal tear of left knee 01/19/2013  . Blood transfusion without reported diagnosis   . Depression   . Diabetes mellitus    borderline  . DJD (degenerative joint disease)   . GERD (gastroesophageal reflux disease)   . Hyperglycemia   . Hypertension   . Peripheral neuropathy   . Restless leg   . Syncope and collapse     MEDICATIONS:  Prior to Admission medications   Medication Sig Start Date End Date Taking? Authorizing Provider  aspirin 81 MG tablet Take 81 mg by mouth daily.   Yes [provider]  esomeprazole (NEXIUM) 20 MG capsule Take 20 mg by mouth every other day.   Yes [provider]  metFORMIN (GLUCOPHAGE-XR) 500 MG 24 hr tablet TAKE 1 TABLET (500 MG TOTAL) BY MOUTH DAILY WITH BREAKFAST. 12/27/16  Yes Dianne DunAron, Talia M, MD  metoprolol succinate (TOPROL-XL) 50 MG 24 hr tablet TAKE 1  TABLET BY MOUTH DAILY WITH OR IMMEDIATELY FOLLOWING A MEAL. OFFICE VISIT REQUIRED FOR REFILLS Patient taking differently: Take 50 mg daily by mouth. TAKE 1 TABLET BY MOUTH DAILY WITH OR IMMEDIATELY FOLLOWING A MEAL. OFFICE VISIT REQUIRED FOR REFILLS 12/27/16  Yes Dianne DunAron, Talia M, MD  polyethylene glycol powder (GLYCOLAX/MIRALAX) powder Take 1 Container as needed by mouth. With Juice   Yes [provider]  ramipril (ALTACE) 5 MG capsule TAKE 1 CAPSULE (5 MG TOTAL) BY MOUTH DAILY. 12/27/16  Yes Dianne DunAron, Talia M, MD  triamterene-hydrochlorothiazide (MAXZIDE-25) 37.5-25 MG tablet Take 1 tablet by mouth daily. 12/27/16  Yes Dianne DunAron, Talia M, MD    ALLERGIES:  No Known Allergies  SOCIAL HISTORY:  Social History   Tobacco Use  . Smoking status: Former Smoker    Packs/day: 1.00    Years: 20.00    Pack years: 20.00    Types: Cigarettes  . Smokeless tobacco: Never Used  Substance Use Topics  . Alcohol use: Yes    Alcohol/week: 0.0 oz    Types: 1 - 2 Glasses of wine per week    Comment: ocassionally    FAMILY HISTORY: Family History  Problem Relation Age of Onset  . Parkinsonism Mother   . Hypertension Father   . Heart attack Father     EXAM: BP 109/75   Pulse 65   Temp (!) 97.3 F (36.3 C) (Oral)   Resp 14   SpO2 98%  CONSTITUTIONAL:  Alert and oriented and responds appropriately to questions. Well-appearing; well-nourished, elderly, afebrile, no distress HEAD: Normocephalic EYES: Conjunctivae clear, pupils appear equal, EOMI ENT: normal nose; moist mucous membranes NECK: Supple, no meningismus, no nuchal rigidity, no LAD  CARD: RRR; S1 and S2 appreciated; no murmurs, no clicks, no rubs, no gallops RESP: Normal chest excursion without splinting or tachypnea; breath sounds clear and equal bilaterally; no wheezes, no rhonchi, no rales, no hypoxia or respiratory distress, speaking full sentences ABD/GI: Normal bowel sounds; non-distended; soft, non-tender, no rebound, no guarding, no  peritoneal signs, no hepatosplenomegaly BACK:  The back appears normal and is non-tender to palpation, there is no CVA tenderness, no midline spinal tenderness or step-off or deformity EXT: Normal ROM in all joints; non-tender to palpation; no edema; normal capillary refill; no cyanosis, no calf tenderness or swelling    SKIN: Normal color for age and race; warm; no rash NEURO: Moves all extremities equally, normal sensation diffusely, normal speech PSYCH: The patient's mood and manner are appropriate. Grooming and personal hygiene are appropriate.  MEDICAL DECISION MAKING: Patient here with sudden onset right flank pain and vomiting.  Urine shows trace leukocytes rare bacteria but many squamous cells.  No blood or other sign of infection.  Doubt pyelonephritis, UTI.  Her abdominal exam is benign.  Doubt appendicitis, colitis, diverticulitis, bowel obstruction.  Suspect possible kidney stone.  She has no midline spinal tenderness and no injury to her back.  Will obtain labs, CT scan for further evaluation.  She declines further pain or nausea medicine at this time.  ED PROGRESS: Patient has mild right hydronephrosis and hydroureter secondary to recently passed stone given there are 2 stones within the bladder measuring 3 and 7 mm.  There is an additional stone within the right kidney.  No other acute abnormality seen.  She is having return of pain and nausea.  Suspect this is from ureteral spasm.  Will give fentanyl, low-dose Toradol and Zofran.  Patient's pain resolved even prior to receiving fentanyl and Toradol.  She will still receive these meds.  Will discharge with short course of Percocet, Zofran and given outpatient urology follow-up.  She has a PCP.  We discussed return precautions.  I feel she is safe to be discharged with her family.  They are comfortable with this plan.    At this time, I do not feel there is any life-threatening condition present. I have reviewed and discussed all results  (EKG, imaging, lab, urine as appropriate) and exam findings with patient/family. I have reviewed nursing notes and appropriate previous records.  I feel the patient is safe to be discharged home without further emergent workup and can continue workup as an outpatient as needed. Discussed usual and customary return precautions. Patient/family verbalize understanding and are comfortable with this plan.  Outpatient follow-up has been provided if needed. All questions have been answered.    Leslie Jester, Layla MawKristen N, DO 07/05/17 [phone]

## 2017-07-04 NOTE — ED Triage Notes (Signed)
Pt states that this evening she began to have R sided flank pain with vomiting x 3. No hx of stones, denies urinary symptoms.

## 2017-07-05 ENCOUNTER — Emergency Department (HOSPITAL_COMMUNITY): Payer: Medicare HMO

## 2017-07-05 DIAGNOSIS — E119 Type 2 diabetes mellitus without complications: Secondary | ICD-10-CM | POA: Diagnosis not present

## 2017-07-05 DIAGNOSIS — Z79899 Other long term (current) drug therapy: Secondary | ICD-10-CM | POA: Diagnosis not present

## 2017-07-05 DIAGNOSIS — N200 Calculus of kidney: Secondary | ICD-10-CM | POA: Diagnosis not present

## 2017-07-05 DIAGNOSIS — N134 Hydroureter: Secondary | ICD-10-CM | POA: Diagnosis not present

## 2017-07-05 DIAGNOSIS — N132 Hydronephrosis with renal and ureteral calculous obstruction: Secondary | ICD-10-CM | POA: Diagnosis not present

## 2017-07-05 DIAGNOSIS — Z7982 Long term (current) use of aspirin: Secondary | ICD-10-CM | POA: Diagnosis not present

## 2017-07-05 DIAGNOSIS — Z87891 Personal history of nicotine dependence: Secondary | ICD-10-CM | POA: Diagnosis not present

## 2017-07-05 DIAGNOSIS — R112 Nausea with vomiting, unspecified: Secondary | ICD-10-CM | POA: Diagnosis not present

## 2017-07-05 DIAGNOSIS — I10 Essential (primary) hypertension: Secondary | ICD-10-CM | POA: Diagnosis not present

## 2017-07-05 DIAGNOSIS — R109 Unspecified abdominal pain: Secondary | ICD-10-CM | POA: Diagnosis not present

## 2017-07-05 DIAGNOSIS — Z7984 Long term (current) use of oral hypoglycemic drugs: Secondary | ICD-10-CM | POA: Diagnosis not present

## 2017-07-05 LAB — CBC WITH DIFFERENTIAL/PLATELET
Basophils Absolute: 0 10*3/uL (ref 0.0–0.1)
Basophils Relative: 0 %
Eosinophils Absolute: 0 10*3/uL (ref 0.0–0.7)
Eosinophils Relative: 0 %
HEMATOCRIT: 43.9 % (ref 36.0–46.0)
HEMOGLOBIN: 14.8 g/dL (ref 12.0–15.0)
LYMPHS ABS: 1.7 10*3/uL (ref 0.7–4.0)
LYMPHS PCT: 11 %
MCH: 29.7 pg (ref 26.0–34.0)
MCHC: 33.7 g/dL (ref 30.0–36.0)
MCV: 88 fL (ref 78.0–100.0)
Monocytes Absolute: 0.7 10*3/uL (ref 0.1–1.0)
Monocytes Relative: 4 %
NEUTROS PCT: 85 %
Neutro Abs: 13.3 10*3/uL — ABNORMAL HIGH (ref 1.7–7.7)
Platelets: 255 10*3/uL (ref 150–400)
RBC: 4.99 MIL/uL (ref 3.87–5.11)
RDW: 13.3 % (ref 11.5–15.5)
WBC: 15.7 10*3/uL — ABNORMAL HIGH (ref 4.0–10.5)

## 2017-07-05 LAB — COMPREHENSIVE METABOLIC PANEL
ALK PHOS: 78 U/L (ref 38–126)
ALT: 26 U/L (ref 14–54)
AST: 30 U/L (ref 15–41)
Albumin: 4.2 g/dL (ref 3.5–5.0)
Anion gap: 12 (ref 5–15)
BILIRUBIN TOTAL: 0.8 mg/dL (ref 0.3–1.2)
BUN: 14 mg/dL (ref 6–20)
CALCIUM: 10.2 mg/dL (ref 8.9–10.3)
CO2: 23 mmol/L (ref 22–32)
CREATININE: 0.99 mg/dL (ref 0.44–1.00)
Chloride: 108 mmol/L (ref 101–111)
GFR, EST NON AFRICAN AMERICAN: 55 mL/min — AB (ref >60)
Glucose, Bld: 115 mg/dL — ABNORMAL HIGH (ref 65–99)
Potassium: 3.9 mmol/L (ref 3.5–5.1)
Sodium: 143 mmol/L (ref 135–145)
Total Protein: 6.6 g/dL (ref 6.5–8.1)

## 2017-07-05 LAB — LIPASE, BLOOD: LIPASE: 28 U/L (ref 11–51)

## 2017-07-05 MED ORDER — OXYCODONE-ACETAMINOPHEN 5-325 MG PO TABS: 1.0000 | ORAL_TABLET | Freq: Four times a day (QID) | ORAL | 0 refills | Status: DC | PRN

## 2017-07-05 MED ORDER — ONDANSETRON HCL 4 MG/2ML IJ SOLN
4.0000 mg | Freq: Once | INTRAMUSCULAR | Status: AC
  Administered 2017-07-05: 4 mg via INTRAVENOUS
  Filled 2017-07-05: qty 2

## 2017-07-05 MED ORDER — FENTANYL CITRATE (PF) 100 MCG/2ML IJ SOLN
50.0000 ug | Freq: Once | INTRAMUSCULAR | Status: AC
  Administered 2017-07-05: 50 ug via INTRAVENOUS
  Filled 2017-07-05: qty 2

## 2017-07-05 MED ORDER — ONDANSETRON 4 MG PO TBDP: 4.0000 mg | ORAL_TABLET | Freq: Three times a day (TID) | ORAL | 0 refills | Status: DC | PRN

## 2017-07-05 MED ORDER — KETOROLAC TROMETHAMINE 30 MG/ML IJ SOLN
15.0000 mg | Freq: Once | INTRAMUSCULAR | Status: AC
  Administered 2017-07-05: 15 mg via INTRAVENOUS
  Filled 2017-07-05: qty 1

## 2017-07-08 ENCOUNTER — Encounter: Payer: Self-pay | Admitting: Family Medicine

## 2017-07-08 ENCOUNTER — Ambulatory Visit (INDEPENDENT_AMBULATORY_CARE_PROVIDER_SITE_OTHER): Payer: Medicare HMO | Admitting: Family Medicine

## 2017-07-08 VITALS — BP 144/70 | HR 73 | Temp 98.4°F | Ht 62.75 in | Wt 237.0 lb

## 2017-07-08 DIAGNOSIS — N200 Calculus of kidney: Secondary | ICD-10-CM

## 2017-07-08 DIAGNOSIS — R109 Unspecified abdominal pain: Secondary | ICD-10-CM

## 2017-07-08 DIAGNOSIS — Z23 Encounter for immunization: Secondary | ICD-10-CM | POA: Diagnosis not present

## 2017-07-08 DIAGNOSIS — R10A1 Flank pain, right side: Secondary | ICD-10-CM | POA: Insufficient documentation

## 2017-07-08 HISTORY — DX: Calculus of kidney: N20.0

## 2017-07-08 NOTE — Progress Notes (Signed)
Subjective:   Patient ID: [name], female    DOB: 08/13/1946, 71 y.o.   MRN: [id number]  [name] is a pleasant 71 y.o. year old female who presents to clinic today with Follow-up (Patient is here today to F/U after ED visit for Nephrolithiasis on 11.09.2018.)  on 07/08/2017  HPI:  Presented to the ER on 07/04/17 for right flank pain.  Notes reviewed.  CT showed mild hydronephrosis and hydroureter due to recently passed stones and remaining stones in the bladder- 3 and 7 mm.  Sent home with zofran and percocet.  Symptoms have completely resolved.  Ct Renal Stone Study  Result Date: 07/05/2017 CLINICAL DATA:  Right flank pain EXAM: CT ABDOMEN AND PELVIS WITHOUT CONTRAST TECHNIQUE: Multidetector CT imaging of the abdomen and pelvis was performed following the standard protocol without IV contrast. COMPARISON:  07/21/2012 FINDINGS: Lower chest: Lung bases are clear. Heart size is normal. Small hiatal hernia. Hepatobiliary: No focal hepatic abnormality. Calcified gallstone. No biliary dilatation. Pancreas: Unremarkable. No pancreatic ductal dilatation or surrounding inflammatory changes. Spleen: Normal in size without focal abnormality. Adrenals/Urinary Tract: Right adrenal gland is normal. 15 mm low-density left adrenal gland mass consistent with adenoma. Multiple cysts in the bilateral kidneys, on the right this measures up to 7.1 cm. On the left this measures up to 7.6 cm. There are multiple stones in the lower pole of the right kidney, including a 17 mm craniocaudad stone in the lower pole. Mild right hydronephrosis and hydroureter. This is suspected to be secondary to a recently passed stone as there are 2 calcifications present in the posterior bladder, on the right this measures 3 mm and on the left this measures 7 mm. The bladder is otherwise normal. Stomach/Bowel: Stomach is within normal limits. Appendix not well seen. No evidence of bowel wall thickening, distention,  or inflammatory changes. Sigmoid colon diverticular disease without acute inflammation. Vascular/Lymphatic: Aortic atherosclerosis. No enlarged abdominal or pelvic lymph nodes. Reproductive: Uterus and bilateral adnexa are unremarkable. Other: Negative for free air or free fluid. Musculoskeletal: Degenerative changes. No acute or suspicious bone lesion IMPRESSION: 1. Mild right hydronephrosis and hydroureter, suspect that this is secondary to a recently passed stone given the finding of 2 stones within the bladder measuring 3 and 7 mm. Additional large stone in the lower pole of the right kidney. 2. Large bilateral renal cysts 3. 15 mm left adrenal gland adenoma. 4. Sigmoid colon diverticular disease without acute inflammation Electronically Signed   By: Jasmine PangKim  Fujinaga M.D.   On: 07/05/2017 00:52   Current Outpatient Medications on File Prior to Visit  Medication Sig Dispense Refill  . aspirin 81 MG tablet Take 81 mg by mouth daily.    Marland Kitchen. esomeprazole (NEXIUM) 20 MG capsule Take 20 mg by mouth every other day.    . metFORMIN (GLUCOPHAGE-XR) 500 MG 24 hr tablet TAKE 1 TABLET (500 MG TOTAL) BY MOUTH DAILY WITH BREAKFAST. 90 tablet 3  . metoprolol succinate (TOPROL-XL) 50 MG 24 hr tablet TAKE 1 TABLET BY MOUTH DAILY WITH OR IMMEDIATELY FOLLOWING A MEAL. OFFICE VISIT REQUIRED FOR REFILLS (Patient taking differently: Take 50 mg daily by mouth. TAKE 1 TABLET BY MOUTH DAILY WITH OR IMMEDIATELY FOLLOWING A MEAL. OFFICE VISIT REQUIRED FOR REFILLS) 90 tablet 3  . polyethylene glycol powder (GLYCOLAX/MIRALAX) powder Take 1 Container as needed by mouth. With Juice    . ramipril (ALTACE) 5 MG capsule TAKE 1 CAPSULE (5 MG TOTAL) BY MOUTH DAILY. 90 capsule 3  .  triamterene-hydrochlorothiazide (MAXZIDE-25) 37.5-25 MG tablet Take 1 tablet by mouth daily. 90 tablet 3  . ondansetron (ZOFRAN ODT) 4 MG disintegrating tablet Take 1 tablet (4 mg total) every 8 (eight) hours as needed by mouth for nausea or vomiting. (Patient not  taking: Reported on 07/08/2017) 20 tablet 0  . oxyCODONE-acetaminophen (PERCOCET/ROXICET) 5-325 MG tablet Take 1 tablet every 6 (six) hours as needed by mouth. (Patient not taking: Reported on 07/08/2017) 10 tablet 0   No current facility-administered medications on file prior to visit.     No Known Allergies  Past Medical History:  Diagnosis Date  . Acute meniscal tear of left knee 01/19/2013  . Blood transfusion without reported diagnosis   . Depression   . Diabetes mellitus    borderline  . DJD (degenerative joint disease)   . GERD (gastroesophageal reflux disease)   . Hyperglycemia   . Hypertension   . Peripheral neuropathy   . Restless leg   . Syncope and collapse     Past Surgical History:  Procedure Laterality Date  . APPENDECTOMY  1976  . CARDIAC CATHETERIZATION  2006   @ [place]  . 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 MULTIPLE TOOTH EXTRACTIONS    . right knee replacement  2008  . TONSILLECTOMY  childhood  . TUBAL LIGATION  1976    Family History  Problem Relation Age of Onset  . Parkinsonism Mother   . Hypertension Father   . Heart attack Father     Social History   Socioeconomic History  . Marital status: Married    Spouse name: Not on file  . Number of children: 2  . Years of education: Not on file  . Highest education level: Not on file  Social Needs  . Financial resource strain: Not on file  . Food insecurity - worry: Not on file  . Food insecurity - inability: Not on file  . Transportation needs - medical: Not on file  . Transportation needs - non-medical: Not on file  Occupational History  . Occupation: retired  Tobacco Use  . Smoking status: Former Smoker    Packs/day: 1.00    Years: 20.00    Pack years: 20.00    Types: Cigarettes  . Smokeless tobacco: Never Used  Substance and Sexual Activity  . Alcohol use: Yes    Alcohol/week: 0.0 oz    Types: 1 - 2 Glasses of wine per week    Comment: ocassionally  . Drug use:  No  . Sexual activity: No  Other Topics Concern  . Not on file  Social History Narrative   Patient would desire CPR.   Would not want feeding tubes or heroic measures.   Does not have HPOA.   The PMH, PSH, Social History, Family History, Medications, and allergies have been reviewed in [facility], and have been updated if relevant.   Review of Systems  Constitutional: Negative.   Gastrointestinal: Negative.   Genitourinary: Negative.   Musculoskeletal: Negative.   Neurological: Negative.   Hematological: Negative.   Psychiatric/Behavioral: Negative.   All other systems reviewed and are negative.      Objective:    BP (!) 144/70 (BP Location: Left Arm, Patient Position: Sitting, Cuff Size: Normal)   Pulse 73   Temp 98.4 F (36.9 C) (Oral)   Ht 5' 2.75" (1.594 m)   Wt 237 lb (107.5 kg)   SpO2 96%   BMI 42.32 kg/m    Physical Exam  General:  Well-developed,well-nourished,in no acute distress; alert,appropriate and cooperative throughout examination Head:  normocephalic and atraumatic.   Eyes:  vision grossly intact, PERRL Ears:  R ear normal and L ear normal externally Nose:  no external deformity.   Mouth:  good dentition.   Neck:  No deformities, masses, or tenderness noted. Abdomen:  Bowel sounds positive,abdomen soft and non-tender without masses, organomegaly or hernias noted. Msk:  No deformity or scoliosis noted of thoracic or lumbar spine.   Extremities:  No clubbing, cyanosis, edema, or deformity noted with normal full range of motion of all joints.   Neurologic:  alert & oriented X3 and gait normal.   Skin:  Intact without suspicious lesions or rashes Cervical Nodes:  No lymphadenopathy noted Axillary Nodes:  No palpable lymphadenopathy Psych:  Cognition and judgment appear intact. Alert and cooperative with normal attention span and concentration. No apparent delusions, illusions, hallucinations       Assessment & Plan:   Need for influenza vaccination -  Plan: Flu Vaccine QUAD 6+ mos PF IM (Fluarix Quad PF)  Kidney stones  Right flank pain No Follow-up on file.

## 2017-07-09 NOTE — Assessment & Plan Note (Signed)
Symptoms have resolved. Likely passed both small stones. No further work up or tx necessary at this time. Call or return to clinic prn if these symptoms worsen or fail to improve as anticipated. The patient indicates understanding of these issues and agrees with the plan.

## 2017-07-15 DIAGNOSIS — H524 Presbyopia: Secondary | ICD-10-CM | POA: Diagnosis not present

## 2017-07-15 DIAGNOSIS — H1852 Epithelial (juvenile) corneal dystrophy: Secondary | ICD-10-CM | POA: Diagnosis not present

## 2017-07-15 DIAGNOSIS — H353121 Nonexudative age-related macular degeneration, left eye, early dry stage: Secondary | ICD-10-CM | POA: Diagnosis not present

## 2017-07-15 DIAGNOSIS — H40053 Ocular hypertension, bilateral: Secondary | ICD-10-CM | POA: Diagnosis not present

## 2017-07-15 DIAGNOSIS — E119 Type 2 diabetes mellitus without complications: Secondary | ICD-10-CM | POA: Diagnosis not present

## 2017-07-15 DIAGNOSIS — H2513 Age-related nuclear cataract, bilateral: Secondary | ICD-10-CM | POA: Diagnosis not present

## 2017-07-15 DIAGNOSIS — H401434 Capsular glaucoma with pseudoexfoliation of lens, bilateral, indeterminate stage: Secondary | ICD-10-CM | POA: Diagnosis not present

## 2017-09-03 DIAGNOSIS — E119 Type 2 diabetes mellitus without complications: Secondary | ICD-10-CM | POA: Diagnosis not present

## 2017-09-03 DIAGNOSIS — I10 Essential (primary) hypertension: Secondary | ICD-10-CM | POA: Diagnosis not present

## 2017-09-03 DIAGNOSIS — Z823 Family history of stroke: Secondary | ICD-10-CM | POA: Diagnosis not present

## 2017-09-03 DIAGNOSIS — Z833 Family history of diabetes mellitus: Secondary | ICD-10-CM | POA: Diagnosis not present

## 2017-09-03 DIAGNOSIS — K219 Gastro-esophageal reflux disease without esophagitis: Secondary | ICD-10-CM | POA: Diagnosis not present

## 2017-09-03 DIAGNOSIS — G8929 Other chronic pain: Secondary | ICD-10-CM | POA: Diagnosis not present

## 2017-09-03 DIAGNOSIS — Z8249 Family history of ischemic heart disease and other diseases of the circulatory system: Secondary | ICD-10-CM | POA: Diagnosis not present

## 2017-09-03 DIAGNOSIS — Z7984 Long term (current) use of oral hypoglycemic drugs: Secondary | ICD-10-CM | POA: Diagnosis not present

## 2017-09-03 DIAGNOSIS — Z7982 Long term (current) use of aspirin: Secondary | ICD-10-CM | POA: Diagnosis not present

## 2017-09-03 DIAGNOSIS — Z809 Family history of malignant neoplasm, unspecified: Secondary | ICD-10-CM | POA: Diagnosis not present

## 2017-09-30 ENCOUNTER — Ambulatory Visit (INDEPENDENT_AMBULATORY_CARE_PROVIDER_SITE_OTHER): Payer: Medicare HMO | Admitting: Primary Care

## 2017-09-30 ENCOUNTER — Encounter: Payer: Self-pay | Admitting: Primary Care

## 2017-09-30 DIAGNOSIS — I10 Essential (primary) hypertension: Secondary | ICD-10-CM | POA: Diagnosis not present

## 2017-09-30 DIAGNOSIS — E1142 Type 2 diabetes mellitus with diabetic polyneuropathy: Secondary | ICD-10-CM | POA: Diagnosis not present

## 2017-09-30 DIAGNOSIS — K219 Gastro-esophageal reflux disease without esophagitis: Secondary | ICD-10-CM

## 2017-09-30 NOTE — Assessment & Plan Note (Signed)
A1C of 5.6 in May 2018, repeat A1C pending today. Managed on Ace, no statin. Foot exam due at upcoming CPE. Continue Metformin.

## 2017-09-30 NOTE — Patient Instructions (Addendum)
Stop by the lab prior to leaving today. I will notify you of your results once received.   Please schedule a physical with me and a Medicare Wellness Visit with our nurse in May/June 2019.   Notify your pharmacy when you are needing refills of your medications.  It was a pleasure meeting you!

## 2017-09-30 NOTE — Assessment & Plan Note (Signed)
Stable on Nexium every other day, will continue this. She does experience difficulty swallowing without Nexium, will continue PPI.

## 2017-09-30 NOTE — Assessment & Plan Note (Signed)
Stable on current regimen, continue same. BMP pending.

## 2017-09-30 NOTE — Progress Notes (Signed)
Subjective:    Patient ID: [name], female    DOB: 03/30/1946, 72 y.o.   MRN: [id number]  HPI  [name] is a 72 year old female who presents today to transfer care from Dr. Dayton MartesAron.  1) Type 2 Diabetes: Currently managed on Metformin ER 500 mg once daily. Her last A1C was 5.6 in May 2018. She does not check her blood sugars. She is managed on ramipril, no statin.   2) GERD: Currently managed on Nexium 20 mg once every other daily. She will experience esophageal burning and difficulty swallowing occasional without her medication. She's actually taking this every other day.   3) Essential Hypertension: Currently managed on metoprolol succinate 50 mg, triamterene-HCTZ, and ramipril 5 mg. She denies chest pain, dizziness, visual changes.   BP Readings from Last 3 Encounters:  09/30/17 122/76  07/08/17 (!) 144/70  07/05/17 (!) 120/58     Review of Systems  Constitutional: Negative for fatigue.  Respiratory: Negative for shortness of breath.   Cardiovascular: Negative for chest pain.  Neurological: Negative for dizziness and headaches.       Past Medical History:  Diagnosis Date  . Acute meniscal tear of left knee 01/19/2013  . Blood transfusion without reported diagnosis   . Depression   . Diabetes mellitus    borderline  . DJD (degenerative joint disease)   . GERD (gastroesophageal reflux disease)   . Hyperglycemia   . Hypertension   . Peripheral neuropathy   . Restless leg   . Syncope and collapse      Social History   Socioeconomic History  . Marital status: Married    Spouse name: Not on file  . Number of children: 2  . Years of education: Not on file  . Highest education level: Not on file  Social Needs  . Financial resource strain: Not on file  . Food insecurity - worry: Not on file  . Food insecurity - inability: Not on file  . Transportation needs - medical: Not on file  . Transportation needs - non-medical: Not on file  Occupational History    . Occupation: retired  Tobacco Use  . Smoking status: Former Smoker    Packs/day: 1.00    Years: 20.00    Pack years: 20.00    Types: Cigarettes  . Smokeless tobacco: Never Used  Substance and Sexual Activity  . Alcohol use: Yes    Alcohol/week: 0.0 oz    Types: 1 - 2 Glasses of wine per week    Comment: ocassionally  . Drug use: No  . Sexual activity: No  Other Topics Concern  . Not on file  Social History Narrative   Patient would desire CPR.   Would not want feeding tubes or heroic measures.   Does not have HPOA.    Past Surgical History:  Procedure Laterality Date  . APPENDECTOMY  1976  . [facility]  2006   [facility]  . 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 KNEE ARTHROSCOPY WITH MEDIAL MENISECTOMY Left 02/11/2013   Procedure: LEFT KNEE ARTHROSCOPY WITH MEDIAL MENISECTOMY;  Surgeon: Jacki Conesonald A Gioffre, MD;  Location: [facility];  Service: Orthopedics;  Laterality: Left;  Marland Kitchen. MULTIPLE TOOTH EXTRACTIONS    . OMENTECTOMY  07/21/2012   Procedure: OMENTECTOMY;  Surgeon: Adolph Pollackodd J Rosenbower, MD;  Location: [facility];  Service: General;  Laterality: N/A;  . right knee replacement  2008  . TONSILLECTOMY  childhood  . TUBAL LIGATION  1976  . UMBILICAL HERNIA REPAIR  07/21/2012   Procedure: HERNIA REPAIR UMBILICAL ADULT;  Surgeon: Adolph Pollack, MD;  Location: [facility];  Service: General;  Laterality: N/A;    Family History  Problem Relation Age of Onset  . Parkinsonism Mother   . Hypertension Father   . Heart attack Father     No Known Allergies  Current Outpatient Medications on File Prior to Visit  Medication Sig Dispense Refill  . aspirin 81 MG tablet Take 81 mg by mouth daily.    Marland Kitchen esomeprazole (NEXIUM) 20 MG capsule Take 20 mg by mouth every other day.    . metFORMIN (GLUCOPHAGE-XR) 500 MG 24 hr tablet TAKE 1 TABLET (500 MG TOTAL) BY MOUTH DAILY WITH BREAKFAST. 90 tablet 3  . metoprolol succinate (TOPROL-XL) 50 MG 24 hr tablet TAKE 1 TABLET BY  MOUTH DAILY WITH OR IMMEDIATELY FOLLOWING A MEAL. OFFICE VISIT REQUIRED FOR REFILLS (Patient taking differently: Take 50 mg daily by mouth. TAKE 1 TABLET BY MOUTH DAILY WITH OR IMMEDIATELY FOLLOWING A MEAL. OFFICE VISIT REQUIRED FOR REFILLS) 90 tablet 3  . polyethylene glycol powder (GLYCOLAX/MIRALAX) powder Take 1 Container as needed by mouth. With Juice    . ramipril (ALTACE) 5 MG capsule TAKE 1 CAPSULE (5 MG TOTAL) BY MOUTH DAILY. 90 capsule 3  . triamterene-hydrochlorothiazide (MAXZIDE-25) 37.5-25 MG tablet Take 1 tablet by mouth daily. 90 tablet 3   No current facility-administered medications on file prior to visit.     BP 122/76   Pulse 73   Temp 98.4 F (36.9 C) (Oral)   Ht 5' 2.75" (1.594 m)   Wt 236 lb 4 oz (107.2 kg)   SpO2 97%   BMI 42.18 kg/m    Objective:   Physical Exam  Constitutional: She appears well-nourished.  Neck: Neck supple.  Cardiovascular: Normal rate and regular rhythm.  Pulmonary/Chest: Effort normal and breath sounds normal.  Skin: Skin is warm and dry.  Psychiatric: She has a normal mood and affect.          Assessment & Plan:

## 2017-10-01 LAB — BASIC METABOLIC PANEL
BUN: 18 mg/dL (ref 6–23)
CHLORIDE: 104 meq/L (ref 96–112)
CO2: 27 mEq/L (ref 19–32)
Calcium: 9.8 mg/dL (ref 8.4–10.5)
Creatinine, Ser: 0.83 mg/dL (ref 0.40–1.20)
GFR: 71.56 mL/min (ref >60.00)
GLUCOSE: 99 mg/dL (ref 70–99)
POTASSIUM: 3.9 meq/L (ref 3.5–5.1)
SODIUM: 140 meq/L (ref 135–145)

## 2017-10-01 LAB — HEMOGLOBIN A1C: Hgb A1c MFr Bld: 5.5 % (ref 4.6–6.5)

## 2017-10-02 ENCOUNTER — Encounter: Payer: Self-pay | Admitting: *Deleted

## 2017-12-31 ENCOUNTER — Encounter: Payer: Medicare HMO | Admitting: Family Medicine

## 2018-01-02 ENCOUNTER — Other Ambulatory Visit: Payer: Self-pay | Admitting: Family Medicine

## 2018-01-02 DIAGNOSIS — I10 Essential (primary) hypertension: Secondary | ICD-10-CM

## 2018-01-02 DIAGNOSIS — E1140 Type 2 diabetes mellitus with diabetic neuropathy, unspecified: Secondary | ICD-10-CM

## 2018-01-02 NOTE — Telephone Encounter (Signed)
Refills sent to pharmacy. 

## 2018-01-02 NOTE — Telephone Encounter (Signed)
KC- please see refill rx thanks. TLG

## 2018-01-03 NOTE — Telephone Encounter (Signed)
Informed pt rx refill was sent to pharmacy. TLG

## 2018-01-12 ENCOUNTER — Other Ambulatory Visit: Payer: Self-pay | Admitting: Family Medicine

## 2018-01-12 DIAGNOSIS — I10 Essential (primary) hypertension: Secondary | ICD-10-CM

## 2018-01-13 ENCOUNTER — Other Ambulatory Visit: Payer: Self-pay | Admitting: Primary Care

## 2018-01-13 DIAGNOSIS — E1140 Type 2 diabetes mellitus with diabetic neuropathy, unspecified: Secondary | ICD-10-CM

## 2018-01-16 ENCOUNTER — Ambulatory Visit (INDEPENDENT_AMBULATORY_CARE_PROVIDER_SITE_OTHER): Payer: Medicare HMO

## 2018-01-16 VITALS — BP 118/84 | HR 49 | Temp 97.7°F | Ht 62.5 in | Wt 236.2 lb

## 2018-01-16 DIAGNOSIS — Z0000 Encounter for general adult medical examination without abnormal findings: Secondary | ICD-10-CM

## 2018-01-16 DIAGNOSIS — E1140 Type 2 diabetes mellitus with diabetic neuropathy, unspecified: Secondary | ICD-10-CM

## 2018-01-16 DIAGNOSIS — H401431 Capsular glaucoma with pseudoexfoliation of lens, bilateral, mild stage: Secondary | ICD-10-CM | POA: Diagnosis not present

## 2018-01-16 DIAGNOSIS — H40023 Open angle with borderline findings, high risk, bilateral: Secondary | ICD-10-CM | POA: Diagnosis not present

## 2018-01-16 LAB — LIPID PANEL
CHOLESTEROL: 125 mg/dL (ref 0–200)
HDL: 52.3 mg/dL (ref >39.00)
LDL Cholesterol: 40 mg/dL (ref 0–99)
NonHDL: 73.05
Total CHOL/HDL Ratio: 2
Triglycerides: 164 mg/dL — ABNORMAL HIGH (ref 0.0–149.0)
VLDL: 32.8 mg/dL (ref 0.0–40.0)

## 2018-01-16 LAB — HEMOGLOBIN A1C: Hgb A1c MFr Bld: 5.5 % (ref 4.6–6.5)

## 2018-01-16 NOTE — Patient Instructions (Addendum)
[name] , Thank you for taking time to come for your Medicare Wellness Visit. I appreciate your ongoing commitment to your health goals. Please review the following plan we discussed and let me know if I can assist you in the future.   These are the goals we discussed: Goals    . Increase physical activity     Starting 01/16/2018, I will attempt to exercise at [facility] for at least 60 min 1-2 days per week.        This is a list of the screening recommended for you and due dates:  Health Maintenance  Topic Date Due  . Tetanus Vaccine  06/02/1963  . DEXA scan (bone density measurement)  06/01/2009  . Mammogram  10/22/2015  . Stool Blood Test  01/03/2017  . Eye exam for diabetics  07/16/2017  . Complete foot exam   12/27/2017  . Flu Shot  03/27/2018  . Hemoglobin A1C  03/30/2018  . Cologuard (Stool DNA test)  01/08/2020  .  Hepatitis C: One time screening is recommended by Center for Disease Control  (CDC) for  adults born from [birth year] through 1967.   Completed  . Pneumonia vaccines  Completed    Preventive Care for Adults  A healthy lifestyle and preventive care can promote health and wellness. Preventive health guidelines for adults include the following key practices.  . A routine yearly physical is a good way to check with your health care provider about your health and preventive screening. It is a chance to share any concerns and updates on your health and to receive a thorough exam.  . Visit your dentist for a routine exam and preventive care every 6 months. Brush your teeth twice a day and floss once a day. Good oral hygiene prevents tooth decay and gum disease.  . The frequency of eye exams is based on your age, health, family medical history, use  of contact lenses, and other factors. Follow your health care provider's recommendations for frequency of eye exams.  . Eat a healthy diet. Foods like vegetables, fruits, whole grains, low-fat dairy products, and lean protein foods  contain the nutrients you need without too many calories. Decrease your intake of foods high in solid fats, added sugars, and salt. Eat the right amount of calories for you. Get information about a proper diet from your health care provider, if necessary.  . Regular physical exercise is one of the most important things you can do for your health. Most adults should get at least 150 minutes of moderate-intensity exercise (any activity that increases your heart rate and causes you to sweat) each week. In addition, most adults need muscle-strengthening exercises on 2 or more days a week.  Silver Sneakers may be a benefit available to you. To determine eligibility, you may visit the website: www.silversneakers.com or contact program at [phone] Mon-Fri between 8AM-8PM.   . Maintain a healthy weight. The body mass index (BMI) is a screening tool to identify possible weight problems. It provides an estimate of body fat based on height and weight. Your health care provider can find your BMI and can help you achieve or maintain a healthy weight.   For adults 20 years and older: ? A BMI below 18.5 is considered underweight. ? A BMI of 18.5 to 24.9 is normal. ? A BMI of 25 to 29.9 is considered overweight. ? A BMI of 30 and above is considered obese.   . Maintain normal blood lipids and cholesterol levels by  exercising and minimizing your intake of saturated fat. Eat a balanced diet with plenty of fruit and vegetables. Blood tests for lipids and cholesterol should begin at age 49 and be repeated every 5 years. If your lipid or cholesterol levels are high, you are over 50, or you are at high risk for heart disease, you may need your cholesterol levels checked more frequently. Ongoing high lipid and cholesterol levels should be treated with medicines if diet and exercise are not working.  . If you smoke, find out from your health care provider how to quit. If you do not use tobacco, please do not  start.  . If you choose to drink alcohol, please do not consume more than 2 drinks per day. One drink is considered to be 12 ounces (355 mL) of beer, 5 ounces (148 mL) of wine, or 1.5 ounces (44 mL) of liquor.  . If you are 24-36 years old, ask your health care provider if you should take aspirin to prevent strokes.  . Use sunscreen. Apply sunscreen liberally and repeatedly throughout the day. You should seek shade when your shadow is shorter than you. Protect yourself by wearing long sleeves, pants, a wide-brimmed hat, and sunglasses year round, whenever you are outdoors.  . Once a month, do a whole body skin exam, using a mirror to look at the skin on your back. Tell your health care provider of new moles, moles that have irregular borders, moles that are larger than a pencil eraser, or moles that have changed in shape or color.

## 2018-01-16 NOTE — Progress Notes (Signed)
Subjective:   [name] is a 72 y.o. female who presents for Medicare Annual (Subsequent) preventive examination.  Review of Systems:  N/A Cardiac Risk Factors include: advanced age (>38men, >44 women);diabetes mellitus;hypertension;obesity (BMI >30kg/m2)     Objective:     Vitals: BP 118/84 (BP Location: Left Arm, Patient Position: Sitting, Cuff Size: Large)   Pulse (!) 49   Temp 97.7 F (36.5 C) (Oral)   Ht 5' 2.5" (1.588 m) Comment: no shoes  Wt 236 lb 4 oz (107.2 kg)   SpO2 97%   BMI 42.52 kg/m   Body mass index is 42.52 kg/m.  Advanced Directives 01/16/2018 12/26/2015 02/11/2013 02/02/2013 01/18/2013 07/22/2012 07/21/2012  Does Patient Have a Medical Advance Directive? No No - Patient does not have advance directive;Patient would not like information Patient does not have advance directive Patient does not have advance directive;Patient would not like information Patient does not have advance directive;Patient would not like information  Would patient like information on creating a medical advance directive? Yes (MAU/Ambulatory/Procedural Areas - Information given) Yes - Educational materials given - - - - -  Pre-existing out of facility DNR order (yellow form or pink MOST form) - - No - No No No    Tobacco Social History   Tobacco Use  Smoking Status Former Smoker  . Packs/day: 1.00  . Years: 20.00  . Pack years: 20.00  . Types: Cigarettes  Smokeless Tobacco Never Used     Counseling given: No   Clinical Intake:  Pre-visit preparation completed: Yes  Pain : 0-10 Pain Score: 2  Pain Type: Chronic pain Pain Location: Shoulder(arm and shoulder) Pain Orientation: Right Pain Onset: More than a month ago Pain Frequency: Constant     Nutritional Status: BMI > 30  Obese Nutritional Risks: None Diabetes: Yes CBG done?: No Did pt. bring in CBG monitor from home?: No  How often do you need to have someone help you when you read instructions, pamphlets,  or other written materials from your doctor or pharmacy?: 1 - Never What is the last grade level you completed in school?: Bachelors degree  Interpreter Needed?: No  Comments: pt is a widow and lives alone Information entered by :: LPinson, LPN  Past Medical History:  Diagnosis Date  . Acute meniscal tear of left knee 01/19/2013  . Blood transfusion without reported diagnosis   . Depression   . Diabetes mellitus    borderline  . DJD (degenerative joint disease)   . GERD (gastroesophageal reflux disease)   . Hyperglycemia   . Hypertension   . Peripheral neuropathy   . Restless leg   . Syncope and collapse    Past Surgical History:  Procedure Laterality Date  . APPENDECTOMY  1976  . CARDIAC CATHETERIZATION  2006   @ [facility]  . 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 KNEE ARTHROSCOPY WITH MEDIAL MENISECTOMY Left 02/11/2013   Procedure: LEFT KNEE ARTHROSCOPY WITH MEDIAL MENISECTOMY;  Surgeon: Jacki Cones, MD;  Location: [facility];  Service: Orthopedics;  Laterality: Left;  Marland Kitchen MULTIPLE TOOTH EXTRACTIONS    . OMENTECTOMY  07/21/2012   Procedure: OMENTECTOMY;  Surgeon: Adolph Pollack, MD;  Location: [facility];  Service: General;  Laterality: N/A;  . right knee replacement  2008  . TONSILLECTOMY  childhood  . TUBAL LIGATION  1976  . UMBILICAL HERNIA REPAIR  07/21/2012   Procedure: HERNIA REPAIR UMBILICAL ADULT;  Surgeon: Adolph Pollack, MD;  Location: [facility];  Service: General;  Laterality: N/A;   Family History  Problem Relation Age of Onset  . Parkinsonism Mother   . Hypertension Father   . Heart attack Father    Social History   Socioeconomic History  . Marital status: Married    Spouse name: Not on file  . Number of children: 2  . Years of education: Not on file  . Highest education level: Not on file  Occupational History  . Occupation: retired  Engineer, production  . Financial resource strain: Not on file  . Food insecurity:    Worry: Not on file     Inability: Not on file  . Transportation needs:    Medical: Not on file    Non-medical: Not on file  Tobacco Use  . Smoking status: Former Smoker    Packs/day: 1.00    Years: 20.00    Pack years: 20.00    Types: Cigarettes  . Smokeless tobacco: Never Used  Substance and Sexual Activity  . Alcohol use: Yes    Comment: ocassionally 1 glass of wine per mth  . Drug use: No  . Sexual activity: Never  Lifestyle  . Physical activity:    Days per week: Not on file    Minutes per session: Not on file  . Stress: Not on file  Relationships  . Social connections:    Talks on phone: Not on file    Gets together: Not on file    Attends religious service: Not on file    Active member of club or organization: Not on file    Attends meetings of clubs or organizations: Not on file    Relationship status: Not on file  Other Topics Concern  . Not on file  Social History Narrative   Patient would desire CPR.   Would not want feeding tubes or heroic measures.   Does not have HPOA.    Outpatient Encounter Medications as of 01/16/2018  Medication Sig  . aspirin 81 MG tablet Take 81 mg by mouth daily.  Marland Kitchen esomeprazole (NEXIUM) 20 MG capsule Take 20 mg by mouth every other day.  . metFORMIN (GLUCOPHAGE-XR) 500 MG 24 hr tablet TAKE 1 TABLET (500 MG TOTAL) BY MOUTH DAILY WITH BREAKFAST.  . metoprolol succinate (TOPROL-XL) 50 MG 24 hr tablet TAKE 1 TABLET BY MOUTH DAILY WITH OR IMMEDIATELY FOLLOWING A MEAL.  Marland Kitchen polyethylene glycol powder (GLYCOLAX/MIRALAX) powder Take 1 Container as needed by mouth. With Juice  . ramipril (ALTACE) 5 MG capsule TAKE 1 CAPSULE (5 MG TOTAL) BY MOUTH DAILY for blood pressure.  . triamterene-hydrochlorothiazide (MAXZIDE-25) 37.5-25 MG tablet Take 1 tablet by mouth once daily for blood pressure.   No facility-administered encounter medications on file as of 01/16/2018.     Activities of Daily Living In your present state of health, do you have any difficulty performing  the following activities: 01/16/2018  Hearing? N  Vision? Y  Difficulty concentrating or making decisions? N  Walking or climbing stairs? Y  Dressing or bathing? N  Doing errands, shopping? N  Preparing Food and eating ? N  Using the Toilet? N  In the past six months, have you accidently leaked urine? N  Do you have problems with loss of bowel control? N  Managing your Medications? N  Managing your Finances? N  Housekeeping or managing your Housekeeping? N  Some recent data might be hidden    Patient Care Team: Doreene Nest, NP as PCP - General (Internal Medicine)  Ranee Gosselin, MD as Consulting Physician (Orthopedic Surgery) Avel Peace, MD as Consulting Physician (General Surgery)    Assessment:   This is a routine wellness examination for [place].   Hearing Screening             Right ear:   40 40 40  40    Left ear:   40 40 40  40    Vision Screening Comments: Vision exam every 6 mths; appt scheduled 01/16/18 with Dr. Blair Promise   Exercise Activities and Dietary recommendations Current Exercise Habits: The patient does not participate in regular exercise at present, Exercise limited by: None identified  Goals    . Increase physical activity     Starting 01/16/2018, I will attempt to exercise at [facility] for at least 60 min 1-2 days per week.        Fall Risk Fall Risk  01/16/2018 12/27/2016 12/26/2015 09/23/2014 09/23/2014  Falls in the past year? No No No No No   Depression Screen PHQ 2/9 Scores 01/16/2018 12/27/2016 12/26/2015 09/23/2014  PHQ - 2 Score 0  PHQ- 9 Score -     Cognitive Function MMSE - Mini Mental State Exam 01/16/2018 12/26/2015  Orientation to time 5 5  Orientation to Place 5 5  Registration 3 3  Attention/ Calculation 0 0  Recall 3 3  Language- name 2 objects 0 0  Language- repeat 1 1  Language- follow 3 step command 3 3  Language- read & follow direction 0 0  Write a  sentence 0 0  Copy design 0 0  Total score 20 20     PLEASE NOTE: A Mini-Cog screen was completed. Maximum score is 20. A value of 0 denotes this part of Folstein MMSE was not completed or the patient failed this part of the Mini-Cog screening.   Mini-Cog Screening Orientation to Time - Max 5 pts Orientation to Place - Max 5 pts Registration - Max 3 pts Recall - Max 3 pts Language Repeat - Max 1 pts Language Follow 3 Step Command - Max 3 pts     Immunization History  Administered Date(s) Administered  . Influenza, High Dose Seasonal PF 07/05/2015  . Influenza,inj,Quad PF,6+ Mos 09/30/2013, 07/08/2017  . Pneumococcal Conjugate-13 09/23/2014  . Pneumococcal Polysaccharide-23 04/02/2012  . Zoster 09/30/2013    Screening Tests Health Maintenance  Topic Date Due  . FOOT EXAM  01/23/2018 (Originally 12/27/2017)  . MAMMOGRAM  01/17/2019 (Originally 10/22/2015)  . DEXA SCAN  01/17/2019 (Originally 06/01/2009)  . TETANUS/TDAP  01/17/2019 (Originally 06[birth year]02/1963)  . INFLUENZA VACCINE  03/27/2018  . OPHTHALMOLOGY EXAM  07/11/2018  . HEMOGLOBIN A1C  07/19/2018  . Fecal DNA (Cologuard)  01/08/2020  . Hepatitis C Screening  Completed  . PNA vac Low Risk Adult  Completed      Plan:     I have personally reviewed, addressed, and noted the following in the patient's chart:  A. Medical and social history B. Use of alcohol, tobacco or illicit drugs  C. Current medications and supplements D. Functional ability and status E.  Nutritional status F.  Physical activity G. Advance directives H. List of other physicians I.  Hospitalizations, surgeries, and ER visits in previous 12 months J.  Vitals K. Screenings to include hearing, vision, cognitive, depression L. Referrals and appointments - none  In addition, I have reviewed and discussed with patient certain preventive protocols, quality metrics, and best practice recommendations. A written personalized care plan for  preventive services as  well as general preventive health recommendations were provided to patient.  See attached scanned questionnaire for additional information.   Signed,   Randa Evens, MHA, BS, LPN Health Coach

## 2018-01-16 NOTE — Progress Notes (Signed)
PCP notes:   Health maintenance:  Foot exam - PCP please address at next appt Mammogram - pt declined Bone density - pt declined Tetanus vaccine - postponed/insurance A1C - completed Eye exam - per pt, exam in Nov 2018  Abnormal screenings:   Depression score: 4 Depression screen [facility] 2/9 01/16/2018 12/27/2016 12/26/2015 09/23/2014 09/23/2014  Decreased Interest 0 3 3 0 0  Down, Depressed, Hopeless 0 0  PHQ - 2 Score 0 0  Altered sleeping - -  Tired, decreased energy - -  Change in appetite 1 3 0 - -  Feeling bad or failure about yourself  0 3 0 - -  Trouble concentrating 0 1 0 - -  Moving slowly or fidgety/restless 0 3 0 - -  Suicidal thoughts 0 0 0 - -  PHQ-9 Score - -  Difficult doing work/chores Not difficult at all Somewhat difficult Not difficult at all - -    Patient concerns:   None   Nurse concerns:  None  Next PCP appt:   01/23/18 @ 1130

## 2018-01-18 NOTE — Progress Notes (Signed)
I reviewed health advisor's note, was available for consultation, and agree with documentation and plan.  

## 2018-01-23 ENCOUNTER — Ambulatory Visit (INDEPENDENT_AMBULATORY_CARE_PROVIDER_SITE_OTHER): Payer: Medicare HMO | Admitting: Primary Care

## 2018-01-23 ENCOUNTER — Encounter: Payer: Self-pay | Admitting: Primary Care

## 2018-01-23 VITALS — BP 120/82 | HR 70 | Temp 98.2°F | Ht 62.5 in | Wt 236.2 lb

## 2018-01-23 DIAGNOSIS — E1142 Type 2 diabetes mellitus with diabetic polyneuropathy: Secondary | ICD-10-CM

## 2018-01-23 DIAGNOSIS — Z0001 Encounter for general adult medical examination with abnormal findings: Secondary | ICD-10-CM | POA: Insufficient documentation

## 2018-01-23 DIAGNOSIS — Z0000 Encounter for general adult medical examination without abnormal findings: Secondary | ICD-10-CM | POA: Insufficient documentation

## 2018-01-23 DIAGNOSIS — K219 Gastro-esophageal reflux disease without esophagitis: Secondary | ICD-10-CM

## 2018-01-23 DIAGNOSIS — E2839 Other primary ovarian failure: Secondary | ICD-10-CM | POA: Diagnosis not present

## 2018-01-23 DIAGNOSIS — I10 Essential (primary) hypertension: Secondary | ICD-10-CM

## 2018-01-23 DIAGNOSIS — I499 Cardiac arrhythmia, unspecified: Secondary | ICD-10-CM

## 2018-01-23 NOTE — Patient Instructions (Addendum)
Call the [facility] to schedule your bone density scan.  You will be contacted regarding your referral to Podiatry.  Please let us know if you have not been contacted within one week.   Start exercising. You should be getting 150 minutes of exercise weekly.  Elevate your legs when resting as discussed.  Try the shoulder exercises as discussed, take a look at the information below.  Please schedule a follow up appointment in 6 months for diabetes check.   It was a pleasure to see you today!   Shoulder Exercises Ask your health care provider which exercises are safe for you. Do exercises exactly as told by your health care provider and adjust them as directed. It is normal to feel mild stretching, pulling, tightness, or discomfort as you do these exercises, but you should stop right away if you feel sudden pain or your pain gets worse.Do not begin these exercises until told by your health care provider. RANGE OF MOTION EXERCISES These exercises warm up your muscles and joints and improve the movement and flexibility of your shoulder. These exercises also help to relieve pain, numbness, and tingling. These exercises involve stretching your injured shoulder directly. Exercise A: Pendulum  1. Stand near a wall or a surface that you can hold onto for balance. 2. Bend at the waist and let your left / right arm hang straight down. Use your other arm to support you. Keep your back straight and do not lock your knees. 3. Relax your left / right arm and shoulder muscles, and move your hips and your trunk so your left / right arm swings freely. Your arm should swing because of the motion of your body, not because you are using your arm or shoulder muscles. 4. Keep moving your body so your arm swings in the following directions, as told by your health care provider: ? Side to side. ? Forward and backward. ? In clockwise and counterclockwise circles. 5. Continue each motion for __________  seconds, or for as long as told by your health care provider. 6. Slowly return to the starting position. Repeat __________ times. Complete this exercise __________ times a day. Exercise B:Flexion, Standing  1. Stand and hold a broomstick, a cane, or a similar object. Place your hands a little more than shoulder-width apart on the object. Your left / right hand should be palm-up, and your other hand should be palm-down. 2. Keep your elbow straight and keep your shoulder muscles relaxed. Push the stick down with your healthy arm to raise your left / right arm in front of your body, and then over your head until you feel a stretch in your shoulder. ? Avoid shrugging your shoulder while you raise your arm. Keep your shoulder blade tucked down toward the middle of your back. 3. Hold for __________ seconds. 4. Slowly return to the starting position. Repeat __________ times. Complete this exercise __________ times a day. Exercise C: Abduction, Standing 1. Stand and hold a broomstick, a cane, or a similar object. Place your hands a little more than shoulder-width apart on the object. Your left / right hand should be palm-up, and your other hand should be palm-down. 2. While keeping your elbow straight and your shoulder muscles relaxed, push the stick across your body toward your left / right side. Raise your left / right arm to the side of your body and then over your head until you feel a stretch in your shoulder. ? Do not raise your arm  above shoulder height, unless your health care provider tells you to do that. ? Avoid shrugging your shoulder while you raise your arm. Keep your shoulder blade tucked down toward the middle of your back. 3. Hold for __________ seconds. 4. Slowly return to the starting position. Repeat __________ times. Complete this exercise __________ times a day. Exercise D:Internal Rotation  1. Place your left / right hand behind your back, palm-up. 2. Use your other hand to  dangle an exercise band, a towel, or a similar object over your shoulder. Grasp the band with your left / right hand so you are holding onto both ends. 3. Gently pull up on the band until you feel a stretch in the front of your left / right shoulder. ? Avoid shrugging your shoulder while you raise your arm. Keep your shoulder blade tucked down toward the middle of your back. 4. Hold for __________ seconds. 5. Release the stretch by letting go of the band and lowering your hands. Repeat __________ times. Complete this exercise __________ times a day. STRETCHING EXERCISES These exercises warm up your muscles and joints and improve the movement and flexibility of your shoulder. These exercises also help to relieve pain, numbness, and tingling. These exercises are done using your healthy shoulder to help stretch the muscles of your injured shoulder. Exercise E: Research officer, political party (External Rotation and Abduction)  1. Stand in a doorway with one of your feet slightly in front of the other. This is called a staggered stance. If you cannot reach your forearms to the door frame, stand facing a corner of a room. 2. Choose one of the following positions as told by your health care provider: ? Place your hands and forearms on the door frame above your head. ? Place your hands and forearms on the door frame at the height of your head. ? Place your hands on the door frame at the height of your elbows. 3. Slowly move your weight onto your front foot until you feel a stretch across your chest and in the front of your shoulders. Keep your head and chest upright and keep your abdominal muscles tight. 4. Hold for __________ seconds. 5. To release the stretch, shift your weight to your back foot. Repeat __________ times. Complete this stretch __________ times a day. Exercise F:Extension, Standing 1. Stand and hold a broomstick, a cane, or a similar object behind your back. ? Your hands should be a little wider than  shoulder-width apart. ? Your palms should face away from your back. 2. Keeping your elbows straight and keeping your shoulder muscles relaxed, move the stick away from your body until you feel a stretch in your shoulder. ? Avoid shrugging your shoulders while you move the stick. Keep your shoulder blade tucked down toward the middle of your back. 3. Hold for __________ seconds. 4. Slowly return to the starting position. Repeat __________ times. Complete this exercise __________ times a day. STRENGTHENING EXERCISES These exercises build strength and endurance in your shoulder. Endurance is the ability to use your muscles for a long time, even after they get tired. Exercise G:External Rotation  1. Sit in a stable chair without armrests. 2. Secure an exercise band at elbow height on your left / right side. 3. Place a soft object, such as a folded towel or a small pillow, between your left / right upper arm and your body to move your elbow a few inches away (about 10 cm) from your side. 4. Hold the end of  the band so it is tight and there is no slack. 5. Keeping your elbow pressed against the soft object, move your left / right forearm out, away from your abdomen. Keep your body steady so only your forearm moves. 6. Hold for __________ seconds. 7. Slowly return to the starting position. Repeat __________ times. Complete this exercise __________ times a day. Exercise H:Shoulder Abduction  1. Sit in a stable chair without armrests, or stand. 2. Hold a __________ weight in your left / right hand, or hold an exercise band with both hands. 3. Start with your arms straight down and your left / right palm facing in, toward your body. 4. Slowly lift your left / right hand out to your side. Do not lift your hand above shoulder height unless your health care provider tells you that this is safe. ? Keep your arms straight. ? Avoid shrugging your shoulder while you do this movement. Keep your shoulder  blade tucked down toward the middle of your back. 5. Hold for __________ seconds. 6. Slowly lower your arm, and return to the starting position. Repeat __________ times. Complete this exercise __________ times a day. Exercise I:Shoulder Extension 1. Sit in a stable chair without armrests, or stand. 2. Secure an exercise band to a stable object in front of you where it is at shoulder height. 3. Hold one end of the exercise band in each hand. Your palms should face each other. 4. Straighten your elbows and lift your hands up to shoulder height. 5. Step back, away from the secured end of the exercise band, until the band is tight and there is no slack. 6. Squeeze your shoulder blades together as you pull your hands down to the sides of your thighs. Stop when your hands are straight down by your sides. Do not let your hands go behind your body. 7. Hold for __________ seconds. 8. Slowly return to the starting position. Repeat __________ times. Complete this exercise __________ times a day. Exercise J:Standing Shoulder Row 1. Sit in a stable chair without armrests, or stand. 2. Secure an exercise band to a stable object in front of you so it is at waist height. 3. Hold one end of the exercise band in each hand. Your palms should be in a thumbs-up position. 4. Bend each of your elbows to an "L" shape (about 90 degrees) and keep your upper arms at your sides. 5. Step back until the band is tight and there is no slack. 6. Slowly pull your elbows back behind you. 7. Hold for __________ seconds. 8. Slowly return to the starting position. Repeat __________ times. Complete this exercise __________ times a day. Exercise K:Shoulder Press-Ups  1. Sit in a stable chair that has armrests. Sit upright, with your feet flat on the floor. 2. Put your hands on the armrests so your elbows are bent and your fingers are pointing forward. Your hands should be about even with the sides of your body. 3. Push down  on the armrests and use your arms to lift yourself off of the chair. Straighten your elbows and lift yourself up as much as you comfortably can. ? Move your shoulder blades down, and avoid letting your shoulders move up toward your ears. ? Keep your feet on the ground. As you get stronger, your feet should support less of your body weight as you lift yourself up. 4. Hold for __________ seconds. 5. Slowly lower yourself back into the chair. Repeat __________ times. Complete this exercise __________  times a day. Exercise L: Wall Push-Ups  1. Stand so you are facing a stable wall. Your feet should be about one arm-length away from the wall. 2. Lean forward and place your palms on the wall at shoulder height. 3. Keep your feet flat on the floor as you bend your elbows and lean forward toward the wall. 4. Hold for __________ seconds. 5. Straighten your elbows to push yourself back to the starting position. Repeat __________ times. Complete this exercise __________ times a day. This information is not intended to replace advice given to you by your health care provider. Make sure you discuss any questions you have with your health care provider. Document Released: 06/27/2005 Document Revised: 05/07/2016 Document Reviewed: 04/24/2015 Elsevier Interactive Patient Education  2018 [employer].

## 2018-01-23 NOTE — Assessment & Plan Note (Signed)
Chronic and progressing. Decreased sensation to planar feet. Discussed to always wear shoes in the house. Will send to podiatry for foot evaluation.  Discussed initiation of gabapentin for neuropathy, she will think about this and notify if interested.

## 2018-01-23 NOTE — Assessment & Plan Note (Signed)
Stable in the office today, continue current regimen. 

## 2018-01-23 NOTE — Assessment & Plan Note (Signed)
Repeat A1C of 5.5. Foot exam today with significant decrease in sensation, will send to podiatry for foot care. Pneumonia vaccination UTD. Managed on ACE. LDL of 40.  Continue Metformin daily. Follow up in 6 months for diabetes check.

## 2018-01-23 NOTE — Assessment & Plan Note (Signed)
Stable on Nexium three times weekly, continue same.

## 2018-01-23 NOTE — Assessment & Plan Note (Signed)
Immunizations UTD. Colon Cancer screening negative, due in 2021. Declines mammogram. Bone density ordered, pending. Exam with irregular rhythm, ECG with NSR with rate of 67, PAC, BBB. No T-wave inversion, ST elevation. Labs unremarkable. Follow up in 1 year for CPE.

## 2018-01-23 NOTE — Progress Notes (Signed)
Subjective:    Patient ID: [name], female    DOB: [birth year], 72 y.o.   MRN: [id number]  HPI  [name] is a 72 year old female who presents today for complete physical. She saw our health advisor last week.   Experiencing right shoulder pain with pain to her right upper arm with mild decrease in ROM with abduction for the last 3-4 months. She uses her iPad daily playing games, typically uses her right arm mostly. Symptoms began after she started using her iPad regularly. Symptoms will improve with rest. She denies numbness/tingling to her right upper extremity, trauma, stiffness.   Immunizations: -Influenza: Completed last season -Pneumonia: Completed Prevnar in 2016, completed Pneumovax in 2013 -Shingles: Completed Zostavax in 2015  Diet: She endorses a healthy diet.  Breakfast: Cereal, fruit, yogurt Lunch: Salad, chicken, beef Dinner: Salad, chicken, beef, vegetables, hamburgers, hot dogs Snacks: Ice cream, chips, cookies, popcorn Desserts: Daily  Beverages: Water, diet Coke, sugar free tea, 1/2 un-sweet/sweet  Exercise: She is not exercising  Eye exam: Completed in 2019 Dental exam: No recent exam Colonoscopy: Completed Cologuard in 2018 Dexa: No recent exam Mammogram: Completed in 2016, declines  Hep C Screen: Negative in 2017  BP Readings from Last 3 Encounters:  01/23/18 120/82  01/16/18 118/84  09/30/17 122/76     Review of Systems  Constitutional: Negative for unexpected weight change.  HENT: Negative for rhinorrhea.   Respiratory: Negative for cough and shortness of breath.   Cardiovascular: Negative for chest pain.  Gastrointestinal: Negative for constipation and diarrhea.  Genitourinary: Negative for difficulty urinating and menstrual problem.  Musculoskeletal: Positive for arthralgias. Negative for myalgias.  Skin: Negative for rash.  Allergic/Immunologic: Negative for environmental allergies.  Neurological: Positive for numbness. Negative  for dizziness and headaches.       Chronic neuropathy  Psychiatric/Behavioral: The patient is not nervous/anxious.        Past Medical History:  Diagnosis Date  . Acute meniscal tear of left knee 01/19/2013  . Blood transfusion without reported diagnosis   . Depression   . Diabetes mellitus    borderline  . DJD (degenerative joint disease)   . GERD (gastroesophageal reflux disease)   . Hyperglycemia   . Hypertension   . Peripheral neuropathy   . Restless leg   . Syncope and collapse      Social History   Socioeconomic History  . Marital status: Married    Spouse name: Not on file  . Number of children: 2  . Years of education: Not on file  . Highest education level: Not on file  Occupational History  . Occupation: retired  Engineer, production  . Financial resource strain: Not on file  . Food insecurity:    Worry: Not on file    Inability: Not on file  . Transportation needs:    Medical: Not on file    Non-medical: Not on file  Tobacco Use  . Smoking status: Former Smoker    Packs/day: 1.00    Years: 20.00    Pack years: 20.00    Types: Cigarettes  . Smokeless tobacco: Never Used  Substance and Sexual Activity  . Alcohol use: Yes    Comment: ocassionally 1 glass of wine per mth  . Drug use: No  . Sexual activity: Never  Lifestyle  . Physical activity:    Days per week: Not on file    Minutes per session: Not on file  . Stress: Not on file  Relationships  .  Social connections:    Talks on phone: Not on file    Gets together: Not on file    Attends religious service: Not on file    Active member of club or organization: Not on file    Attends meetings of clubs or organizations: Not on file    Relationship status: Not on file  . Intimate partner violence:    Fear of current or ex partner: Not on file    Emotionally abused: Not on file    Physically abused: Not on file    Forced sexual activity: Not on file  Other Topics Concern  . Not on file  Social  History Narrative   Patient would desire CPR.   Would not want feeding tubes or heroic measures.   Does not have HPOA.    Past Surgical History:  Procedure Laterality Date  . APPENDECTOMY  1976  . CARDIAC CATHETERIZATION  2006   @ [place]  . 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 KNEE ARTHROSCOPY WITH MEDIAL MENISECTOMY Left 02/11/2013   Procedure: LEFT KNEE ARTHROSCOPY WITH MEDIAL MENISECTOMY;  Surgeon: Jacki Cones, MD;  Location: [facility];  Service: Orthopedics;  Laterality: Left;  Marland Kitchen MULTIPLE TOOTH EXTRACTIONS    . OMENTECTOMY  07/21/2012   Procedure: OMENTECTOMY;  Surgeon: Adolph Pollack, MD;  Location: [facility];  Service: General;  Laterality: N/A;  . right knee replacement  2008  . TONSILLECTOMY  childhood  . TUBAL LIGATION  1976  . UMBILICAL HERNIA REPAIR  07/21/2012   Procedure: HERNIA REPAIR UMBILICAL ADULT;  Surgeon: Adolph Pollack, MD;  Location: [facility];  Service: General;  Laterality: N/A;    Family History  Problem Relation Age of Onset  . Parkinsonism Mother   . Hypertension Father   . Heart attack Father     No Known Allergies  Current Outpatient Medications on File Prior to Visit  Medication Sig Dispense Refill  . aspirin 81 MG tablet Take 81 mg by mouth daily.    Marland Kitchen esomeprazole (NEXIUM) 20 MG capsule Take 20 mg by mouth every other day.    . metFORMIN (GLUCOPHAGE-XR) 500 MG 24 hr tablet TAKE 1 TABLET (500 MG TOTAL) BY MOUTH DAILY WITH BREAKFAST. 90 tablet 3  . metoprolol succinate (TOPROL-XL) 50 MG 24 hr tablet TAKE 1 TABLET BY MOUTH DAILY WITH OR IMMEDIATELY FOLLOWING A MEAL. 90 tablet 3  . polyethylene glycol powder (GLYCOLAX/MIRALAX) powder Take 1 Container as needed by mouth. With Juice    . ramipril (ALTACE) 5 MG capsule TAKE 1 CAPSULE (5 MG TOTAL) BY MOUTH DAILY for blood pressure. 90 capsule 2  . triamterene-hydrochlorothiazide (MAXZIDE-25) 37.5-25 MG tablet Take 1 tablet by mouth once daily for blood pressure. 90 tablet 2   No  current facility-administered medications on file prior to visit.     BP 120/82   Pulse 70   Temp 98.2 F (36.8 C) (Oral)   Ht 5' 2.5" (1.588 m)   Wt 236 lb 4 oz (107.2 kg)   SpO2 96%   BMI 42.52 kg/m    Objective:   Physical Exam  Constitutional: She is oriented to person, place, and time. She appears well-nourished.  HENT:  Mouth/Throat: No oropharyngeal exudate.  Eyes: Pupils are equal, round, and reactive to light. EOM are normal.  Neck: Neck supple. No thyromegaly present.  Cardiovascular: Normal rate. An irregular rhythm present.  Respiratory: Effort normal and breath sounds normal.  GI: Soft. Bowel sounds are  normal. There is no tenderness.  Musculoskeletal: Normal range of motion.  Neurological: She is alert and oriented to person, place, and time.  Skin: Skin is warm and dry.  Psychiatric: She has a normal mood and affect.           Assessment & Plan:

## 2018-01-24 ENCOUNTER — Telehealth: Payer: Self-pay | Admitting: Primary Care

## 2018-01-24 NOTE — Telephone Encounter (Signed)
Best number [phone] Pt would like to get a handicap sticker

## 2018-01-24 NOTE — Telephone Encounter (Signed)
Form completed and placed in Chan's inbox. 

## 2018-01-24 NOTE — Telephone Encounter (Signed)
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 It's a diagnosis code that I used to get her bone density scan approved through insurance.

## 2018-01-24 NOTE — Telephone Encounter (Signed)
Copied from CRM [id number]. Topic: Quick Communication - See Telephone Encounter >> Jan 24, 2018  1:49 PM Landry Mellow wrote: CRM for notification. See Telephone encounter for: 01/24/18. Pt called -she is asking why she has estrogen def on her AVS from her last visit. She does not remember talking about this and would like further explanation.  Cb is [phone]

## 2018-01-24 NOTE — Telephone Encounter (Signed)
If kate approves handicap sticker please mail form to pt

## 2018-01-27 NOTE — Telephone Encounter (Signed)
Spoken and notified patient of Kate Clark's comments. Patient verbalized understanding.  

## 2018-01-27 NOTE — Telephone Encounter (Signed)
Placing in the mail for patient.

## 2018-02-06 ENCOUNTER — Encounter: Payer: Self-pay | Admitting: Podiatry

## 2018-02-06 ENCOUNTER — Ambulatory Visit (INDEPENDENT_AMBULATORY_CARE_PROVIDER_SITE_OTHER): Payer: Medicare HMO | Admitting: Podiatry

## 2018-02-06 VITALS — BP 162/80 | HR 67

## 2018-02-06 DIAGNOSIS — E1149 Type 2 diabetes mellitus with other diabetic neurological complication: Secondary | ICD-10-CM | POA: Diagnosis not present

## 2018-02-06 DIAGNOSIS — M79674 Pain in right toe(s): Secondary | ICD-10-CM | POA: Diagnosis not present

## 2018-02-06 DIAGNOSIS — M79675 Pain in left toe(s): Secondary | ICD-10-CM

## 2018-02-06 DIAGNOSIS — B351 Tinea unguium: Secondary | ICD-10-CM

## 2018-02-06 DIAGNOSIS — Q828 Other specified congenital malformations of skin: Secondary | ICD-10-CM | POA: Diagnosis not present

## 2018-02-06 NOTE — Progress Notes (Signed)
Complaint:  Visit Type: Patient returns to my office for continued preventative foot care services. Complaint: Patient states" my nails have grown long and thick and become painful to walk and wear shoes" Patient has been diagnosed with DM with no foot complications. The patient presents for preventative foot care services. No changes to ROS.  Patient has a callus on the bottom of her left foot.  Podiatric Exam: Vascular: dorsalis pedis and posterior tibial pulses are palpable bilateral. Capillary return is immediate. Temperature gradient is WNL. Skin turgor WNL  Sensorium: Normal Semmes Weinstein monofilament test. Normal tactile sensation bilaterally. Nail Exam: Pt has thick disfigured discolored nails with subungual debris noted bilateral entire nail hallux through fifth toenails Ulcer Exam: There is no evidence of ulcer or pre-ulcerative changes or infection. Orthopedic Exam: Muscle tone and strength are WNL. No limitations in general ROM. No crepitus or effusions noted. Foot type and digits show no abnormalities. Bony prominences are unremarkable. Skin:  Porokeratosis sub 4th left foot.. No infection or ulcers  Diagnosis:  Onychomycosis, , Pain in right toe, pain in left toes  Treatment & Plan Procedures and Treatment: Consent by patient was obtained for treatment procedures.   Debridement of mycotic and hypertrophic toenails, 1 through 5 bilateral and clearing of subungual debris. No ulceration, no infection noted. Debride porokeratosis Return Visit-Office Procedure: Patient instructed to return to the office for a follow up visit prn  for continued evaluation and treatment.    Helane GuntherGregory Bartosz Luginbill DPM

## 2018-02-13 ENCOUNTER — Ambulatory Visit
Admission: RE | Admit: 2018-02-13 | Discharge: 2018-02-13 | Disposition: A | Discharge status: Home / Self Care | Payer: Medicare HMO | Source: Ambulatory Visit | Attending: Primary Care | Admitting: Primary Care

## 2018-02-13 DIAGNOSIS — E2839 Other primary ovarian failure: Secondary | ICD-10-CM | POA: Diagnosis not present

## 2018-02-13 DIAGNOSIS — Z780 Asymptomatic menopausal state: Secondary | ICD-10-CM | POA: Diagnosis not present

## 2018-02-13 DIAGNOSIS — M85852 Other specified disorders of bone density and structure, left thigh: Secondary | ICD-10-CM | POA: Diagnosis not present

## 2018-07-28 ENCOUNTER — Ambulatory Visit: Payer: Medicare HMO | Admitting: Primary Care

## 2018-07-28 ENCOUNTER — Encounter: Payer: Self-pay | Admitting: Primary Care

## 2018-07-28 ENCOUNTER — Ambulatory Visit (INDEPENDENT_AMBULATORY_CARE_PROVIDER_SITE_OTHER): Payer: Medicare HMO | Admitting: Primary Care

## 2018-07-28 VITALS — BP 126/82 | HR 70 | Temp 98.6°F | Ht 62.5 in | Wt 236.5 lb

## 2018-07-28 DIAGNOSIS — R5383 Other fatigue: Secondary | ICD-10-CM

## 2018-07-28 DIAGNOSIS — E1142 Type 2 diabetes mellitus with diabetic polyneuropathy: Secondary | ICD-10-CM

## 2018-07-28 DIAGNOSIS — Z23 Encounter for immunization: Secondary | ICD-10-CM

## 2018-07-28 LAB — VITAMIN B12: VITAMIN B 12: 184 pg/mL — AB (ref 211–911)

## 2018-07-28 LAB — HEMOGLOBIN A1C: Hgb A1c MFr Bld: 5.5 % (ref 4.6–6.5)

## 2018-07-28 LAB — TSH: TSH: 3.02 u[IU]/mL (ref 0.35–4.50)

## 2018-07-28 NOTE — Assessment & Plan Note (Signed)
Repeat A1C pending. Discussed the importance of a healthy diet and regular exercise in order for weight loss, and to reduce the risk of any potential medical problems.  Pneumonia vaccination UTD. Eye exam scheduled for tomorrow. Foot exam today. Managed on ACE. LDL of 40 without statin.  Follow up in 6 months based off of A1C result.

## 2018-07-28 NOTE — Progress Notes (Signed)
Subjective:    Patient ID: [name], female    DOB: 03/22/1946, 72 y.o.   MRN: [id number]  HPI  [name] is a 72 year old female who presents today for follow up of type 2 diabetes.  Current medications include: Metformin ER 500 mg daily.  She does not check her glucose.   Last A1C: 5.5 in May 2019 Last Eye Exam: Due tomorrow  Last Foot Exam: Due today Pneumonia Vaccination: completed in 2016 ACE/ARB: Ramipril  Statin: None. LDL of 40 in May 2019  Diet currently consists of:  Breakfast: Cereal, fruit Lunch: Sandwich, soup Dinner: Meat, vegetable, starch, salad Snacks: Popcorn, chips, cookie, candy Desserts: Daily  Beverages: Some water, Sweet tea/lemonade, regular and diet soda   Exercise: She is not exercising    BP Readings from Last 3 Encounters:  07/28/18 126/82  02/06/18 (!) 162/80  01/23/18 120/82     Review of Systems  Eyes: Negative for visual disturbance.       Following with ophthalmology for glaucoma  Respiratory: Negative for shortness of breath.   Cardiovascular: Negative for chest pain.  Neurological: Negative for dizziness.       Chronic numbness to bilateral plantar feet       Past Medical History:  Diagnosis Date  . Acute meniscal tear of left knee 01/19/2013  . Blood transfusion without reported diagnosis   . Depression   . Diabetes mellitus    borderline  . DJD (degenerative joint disease)   . GERD (gastroesophageal reflux disease)   . Hyperglycemia   . Hypertension   . Peripheral neuropathy   . Restless leg   . Syncope and collapse      Social History   Socioeconomic History  . Marital status: Married    Spouse name: Not on file  . Number of children: 2  . Years of education: Not on file  . Highest education level: Not on file  Occupational History  . Occupation: retired  Engineer, productionocial Needs  . Financial resource strain: Not on file  . Food insecurity:    Worry: Not on file    Inability: Not on file  .  Transportation needs:    Medical: Not on file    Non-medical: Not on file  Tobacco Use  . Smoking status: Former Smoker    Packs/day: 1.00    Years: 20.00    Pack years: 20.00    Types: Cigarettes  . Smokeless tobacco: Never Used  Substance and Sexual Activity  . Alcohol use: Yes    Comment: ocassionally 1 glass of wine per mth  . Drug use: No  . Sexual activity: Never  Lifestyle  . Physical activity:    Days per week: Not on file    Minutes per session: Not on file  . Stress: Not on file  Relationships  . Social connections:    Talks on phone: Not on file    Gets together: Not on file    Attends religious service: Not on file    Active member of club or organization: Not on file    Attends meetings of clubs or organizations: Not on file    Relationship status: Not on file  . Intimate partner violence:    Fear of current or ex partner: Not on file    Emotionally abused: Not on file    Physically abused: Not on file    Forced sexual activity: Not on file  Other Topics Concern  . Not on file  Social History Narrative   Patient would desire CPR.   Would not want feeding tubes or heroic measures.   Does not have HPOA.    Past Surgical History:  Procedure Laterality Date  . APPENDECTOMY  1976  . CARDIAC CATHETERIZATION  2006   @ [place]  . 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 KNEE ARTHROSCOPY WITH MEDIAL MENISECTOMY Left 02/11/2013   Procedure: LEFT KNEE ARTHROSCOPY WITH MEDIAL MENISECTOMY;  Surgeon: Jacki Cones, MD;  Location: [facility];  Service: Orthopedics;  Laterality: Left;  Marland Kitchen MULTIPLE TOOTH EXTRACTIONS    . OMENTECTOMY  07/21/2012   Procedure: OMENTECTOMY;  Surgeon: Adolph Pollack, MD;  Location: [facility];  Service: General;  Laterality: N/A;  . right knee replacement  2008  . TONSILLECTOMY  childhood  . TUBAL LIGATION  1976  . UMBILICAL HERNIA REPAIR  07/21/2012   Procedure: HERNIA REPAIR UMBILICAL ADULT;  Surgeon: Adolph Pollack, MD;   Location: [facility];  Service: General;  Laterality: N/A;    Family History  Problem Relation Age of Onset  . Parkinsonism Mother   . Hypertension Father   . Heart attack Father     No Known Allergies  Current Outpatient Medications on File Prior to Visit  Medication Sig Dispense Refill  . aspirin 81 MG tablet Take 81 mg by mouth daily.    . Calcium Carb-Cholecalciferol (CALCIUM 500+D PO) Take by mouth.    . esomeprazole (NEXIUM) 20 MG capsule Take 20 mg by mouth every other day.    . metFORMIN (GLUCOPHAGE-XR) 500 MG 24 hr tablet TAKE 1 TABLET (500 MG TOTAL) BY MOUTH DAILY WITH BREAKFAST. 90 tablet 3  . metoprolol succinate (TOPROL-XL) 50 MG 24 hr tablet TAKE 1 TABLET BY MOUTH DAILY WITH OR IMMEDIATELY FOLLOWING A MEAL. 90 tablet 3  . polyethylene glycol powder (GLYCOLAX/MIRALAX) powder Take 1 Container as needed by mouth. With Juice    . ramipril (ALTACE) 5 MG capsule TAKE 1 CAPSULE (5 MG TOTAL) BY MOUTH DAILY for blood pressure. 90 capsule 2  . triamterene-hydrochlorothiazide (MAXZIDE-25) 37.5-25 MG tablet Take 1 tablet by mouth once daily for blood pressure. 90 tablet 2   No current facility-administered medications on file prior to visit.     BP 126/82   Pulse 70   Temp 98.6 F (37 C) (Oral)   Ht 5' 2.5" (1.588 m)   Wt 236 lb 8 oz (107.3 kg)   SpO2 97%   BMI 42.57 kg/m    Objective:   Physical Exam  Constitutional: She appears well-nourished.  Neck: Neck supple.  Cardiovascular: Normal rate and regular rhythm.  Respiratory: Effort normal and breath sounds normal.  Skin: Skin is warm and dry.           Assessment & Plan:

## 2018-07-28 NOTE — Addendum Note (Signed)
Addended by: Tawnya CrookSAMBATH, Ronnica Dreese on: 07/28/2018 12:51 PM   Modules accepted: Orders

## 2018-07-28 NOTE — Patient Instructions (Addendum)
Stop by the lab prior to leaving today. I will notify you of your results once received.   Start exercising. You should be getting 150 minutes of exercise weekly.  It is important that you improve your diet. Please limit carbohydrates in the form of white bread, rice, pasta, sweets, fast food, fried food, sugary drinks, etc. Increase your consumption of fresh fruits and vegetables, whole grains, lean protein.  Ensure you are consuming 64 ounces of water daily.  We will see you in May/June 2020 as scheduled. It was a pleasure to see you today!   Diabetes Mellitus and Nutrition When you have diabetes (diabetes mellitus), it is very important to have healthy eating habits because your blood sugar (glucose) levels are greatly affected by what you eat and drink. Eating healthy foods in the appropriate amounts, at about the same times every day, can help you:  Control your blood glucose.  Lower your risk of heart disease.  Improve your blood pressure.  Reach or maintain a healthy weight.  Every person with diabetes is different, and each person has different needs for a meal plan. Your health care provider may recommend that you work with a diet and nutrition specialist (dietitian) to make a meal plan that is best for you. Your meal plan may vary depending on factors such as:  The calories you need.  The medicines you take.  Your weight.  Your blood glucose, blood pressure, and cholesterol levels.  Your activity level.  Other health conditions you have, such as heart or kidney disease.  How do carbohydrates affect me? Carbohydrates affect your blood glucose level more than any other type of food. Eating carbohydrates naturally increases the amount of glucose in your blood. Carbohydrate counting is a method for keeping track of how many carbohydrates you eat. Counting carbohydrates is important to keep your blood glucose at a healthy level, especially if you use insulin or take certain  oral diabetes medicines. It is important to know how many carbohydrates you can safely have in each meal. This is different for every person. Your dietitian can help you calculate how many carbohydrates you should have at each meal and for snack. Foods that contain carbohydrates include:  Bread, cereal, rice, pasta, and crackers.  Potatoes and corn.  Peas, beans, and lentils.  Milk and yogurt.  Fruit and juice.  Desserts, such as cakes, cookies, ice cream, and candy.  How does alcohol affect me? Alcohol can cause a sudden decrease in blood glucose (hypoglycemia), especially if you use insulin or take certain oral diabetes medicines. Hypoglycemia can be a life-threatening condition. Symptoms of hypoglycemia (sleepiness, dizziness, and confusion) are similar to symptoms of having too much alcohol. If your health care provider says that alcohol is safe for you, follow these guidelines:  Limit alcohol intake to no more than 1 drink per day for nonpregnant women and 2 drinks per day for men. One drink equals 12 oz of beer, 5 oz of wine, or 1 oz of hard liquor.  Do not drink on an empty stomach.  Keep yourself hydrated with water, diet soda, or unsweetened iced tea.  Keep in mind that regular soda, juice, and other mixers may contain a lot of sugar and must be counted as carbohydrates.  What are tips for following this plan? Reading food labels  Start by checking the serving size on the label. The amount of calories, carbohydrates, fats, and other nutrients listed on the label are based on one serving of the  food. Many foods contain more than one serving per package.  Check the total grams (g) of carbohydrates in one serving. You can calculate the number of servings of carbohydrates in one serving by dividing the total carbohydrates by 15. For example, if a food has 30 g of total carbohydrates, it would be equal to 2 servings of carbohydrates.  Check the number of grams (g) of saturated  and trans fats in one serving. Choose foods that have low or no amount of these fats.  Check the number of milligrams (mg) of sodium in one serving. Most people should limit total sodium intake to less than 2,300 mg per day.  Always check the nutrition information of foods labeled as "low-fat" or "nonfat". These foods may be higher in added sugar or refined carbohydrates and should be avoided.  Talk to your dietitian to identify your daily goals for nutrients listed on the label. Shopping  Avoid buying canned, premade, or processed foods. These foods tend to be high in fat, sodium, and added sugar.  Shop around the outside edge of the grocery store. This includes fresh fruits and vegetables, bulk grains, fresh meats, and fresh dairy. Cooking  Use low-heat cooking methods, such as baking, instead of high-heat cooking methods like deep frying.  Cook using healthy oils, such as olive, canola, or sunflower oil.  Avoid cooking with butter, cream, or high-fat meats. Meal planning  Eat meals and snacks regularly, preferably at the same times every day. Avoid going long periods of time without eating.  Eat foods high in fiber, such as fresh fruits, vegetables, beans, and whole grains. Talk to your dietitian about how many servings of carbohydrates you can eat at each meal.  Eat 4-6 ounces of lean protein each day, such as lean meat, chicken, fish, eggs, or tofu. 1 ounce is equal to 1 ounce of meat, chicken, or fish, 1 egg, or 1/4 cup of tofu.  Eat some foods each day that contain healthy fats, such as avocado, nuts, seeds, and fish. Lifestyle   Check your blood glucose regularly.  Exercise at least 30 minutes 5 or more days each week, or as told by your health care provider.  Take medicines as told by your health care provider.  Do not use any products that contain nicotine or tobacco, such as cigarettes and e-cigarettes. If you need help quitting, ask your health care provider.  Work  with a Veterinary surgeon or diabetes educator to identify strategies to manage stress and any emotional and social challenges. What are some questions to ask my health care provider?  Do I need to meet with a diabetes educator?  Do I need to meet with a dietitian?  What number can I call if I have questions?  When are the best times to check my blood glucose? Where to find more information:  American Diabetes Association: diabetes.org/food-and-fitness/food  Academy of Nutrition and Dietetics: https://www.vargas.com/  [employer] of Diabetes and Digestive and Kidney Diseases (NIH): FindJewelers.cz Summary  A healthy meal plan will help you control your blood glucose and maintain a healthy lifestyle.  Working with a diet and nutrition specialist (dietitian) can help you make a meal plan that is best for you.  Keep in mind that carbohydrates and alcohol have immediate effects on your blood glucose levels. It is important to count carbohydrates and to use alcohol carefully. This information is not intended to replace advice given to you by your health care provider. Make sure you discuss any questions you  have with your health care provider. Document Released: 05/10/2005 Document Revised: 09/17/2016 Document Reviewed: 09/17/2016 Elsevier Interactive Patient Education  [employer].

## 2018-07-29 ENCOUNTER — Other Ambulatory Visit: Payer: Self-pay | Admitting: Primary Care

## 2018-07-29 DIAGNOSIS — H40053 Ocular hypertension, bilateral: Secondary | ICD-10-CM | POA: Diagnosis not present

## 2018-07-29 DIAGNOSIS — H353121 Nonexudative age-related macular degeneration, left eye, early dry stage: Secondary | ICD-10-CM | POA: Diagnosis not present

## 2018-07-29 DIAGNOSIS — H5213 Myopia, bilateral: Secondary | ICD-10-CM | POA: Diagnosis not present

## 2018-07-29 DIAGNOSIS — H1852 Epithelial (juvenile) corneal dystrophy: Secondary | ICD-10-CM | POA: Diagnosis not present

## 2018-07-29 DIAGNOSIS — E538 Deficiency of other specified B group vitamins: Secondary | ICD-10-CM

## 2018-07-29 DIAGNOSIS — H40023 Open angle with borderline findings, high risk, bilateral: Secondary | ICD-10-CM | POA: Diagnosis not present

## 2018-07-29 DIAGNOSIS — H278 Other specified disorders of lens: Secondary | ICD-10-CM | POA: Diagnosis not present

## 2018-07-29 DIAGNOSIS — E119 Type 2 diabetes mellitus without complications: Secondary | ICD-10-CM | POA: Diagnosis not present

## 2018-07-29 DIAGNOSIS — H2513 Age-related nuclear cataract, bilateral: Secondary | ICD-10-CM | POA: Diagnosis not present

## 2018-07-29 LAB — HM DIABETES EYE EXAM

## 2018-07-30 ENCOUNTER — Encounter: Payer: Self-pay | Admitting: *Deleted

## 2018-07-30 ENCOUNTER — Encounter: Payer: Self-pay | Admitting: Primary Care

## 2018-07-30 ENCOUNTER — Telehealth: Payer: Self-pay | Admitting: *Deleted

## 2018-07-30 NOTE — Telephone Encounter (Signed)
Message left for patient to return my call.  

## 2018-07-30 NOTE — Telephone Encounter (Signed)
-----   Message from Doreene NestKatherine K Clark, NP sent at 07/29/2018  8:36 AM EST ----- Please notify patient:  Vitamin B 12 level is very low. This could be contributing to her fatigue.  Needs to start oral B12 1000 mcg once daily.  Thyroid function and blood sugar are normal.  Please schedule for a lab only appointment to recheck b12 in 3 months.

## 2018-07-31 NOTE — Telephone Encounter (Signed)
This has been addressed in results note. 

## 2018-10-12 ENCOUNTER — Other Ambulatory Visit: Payer: Self-pay | Admitting: Primary Care

## 2018-10-12 DIAGNOSIS — I10 Essential (primary) hypertension: Secondary | ICD-10-CM

## 2018-10-21 ENCOUNTER — Telehealth: Payer: Self-pay | Admitting: Primary Care

## 2018-10-21 NOTE — Telephone Encounter (Signed)
Left message asking pt to call office please r/s 6/2 appointment with [facility]

## 2018-10-29 ENCOUNTER — Other Ambulatory Visit (INDEPENDENT_AMBULATORY_CARE_PROVIDER_SITE_OTHER): Payer: Medicare HMO

## 2018-10-29 DIAGNOSIS — E538 Deficiency of other specified B group vitamins: Secondary | ICD-10-CM

## 2018-10-29 LAB — VITAMIN B12: VITAMIN B 12: 631 pg/mL (ref 211–911)

## 2018-10-30 ENCOUNTER — Telehealth: Payer: Self-pay | Admitting: Primary Care

## 2018-10-30 NOTE — Telephone Encounter (Signed)
Patient returned Chan's call about her lab work resutls.

## 2018-10-31 NOTE — Telephone Encounter (Signed)
Spoken and notified patient of Kate Clark's comments. Patient verbalized understanding.  

## 2019-01-10 ENCOUNTER — Other Ambulatory Visit: Payer: Self-pay | Admitting: Primary Care

## 2019-01-10 DIAGNOSIS — I10 Essential (primary) hypertension: Secondary | ICD-10-CM

## 2019-01-10 DIAGNOSIS — E1140 Type 2 diabetes mellitus with diabetic neuropathy, unspecified: Secondary | ICD-10-CM

## 2019-01-13 ENCOUNTER — Ambulatory Visit (INDEPENDENT_AMBULATORY_CARE_PROVIDER_SITE_OTHER): Payer: Medicare HMO | Admitting: Primary Care

## 2019-01-13 ENCOUNTER — Other Ambulatory Visit: Payer: Self-pay

## 2019-01-13 ENCOUNTER — Encounter: Payer: Self-pay | Admitting: Primary Care

## 2019-01-13 VITALS — BP 126/80 | HR 78 | Temp 97.8°F | Ht 62.5 in | Wt 238.5 lb

## 2019-01-13 DIAGNOSIS — L03115 Cellulitis of right lower limb: Secondary | ICD-10-CM | POA: Insufficient documentation

## 2019-01-13 MED ORDER — CEPHALEXIN 500 MG PO CAPS: 500.0000 mg | ORAL_CAPSULE | Freq: Three times a day (TID) | ORAL | 0 refills | Status: AC

## 2019-01-13 NOTE — Assessment & Plan Note (Signed)
Unclear etiology but there is most certainly evidence of an early developing cellulitis to site.   Rx for cephalexin TID x 7 days sent to pharmacy. Discussed dressings for protection. Dressing applied today.  Follow up PRN, return precautions provided.

## 2019-01-13 NOTE — Progress Notes (Signed)
Subjective:    Patient ID: [name], female    DOB: 10-11-1945, 73 y.o.   MRN: [id number]  HPI  Ms. Capulong is a 73 year old female with a history of hypertension, peripheral neuropathy, type 2 diabetes who presents today with a chief complaint of blister.  Her blister is located to the right anterior lower extremity proximal to her ankle. She first noticed the blister three days ago after scratching her leg that was itching. Once she scratched the site she noticed the blister pop and liquid came out. Since then she's noticed discomfort and redness around the site. She didn't notice an insect bite but does have peripheral neuropathy and typically has decreased sensation to her legs.   She denies fevers, fatigue, weakness. Overall her chronic edema has improved.   Review of Systems  Constitutional: Negative for fever.  Skin: Positive for color change and wound.  Neurological: Negative for weakness.       Past Medical History:  Diagnosis Date  . Acute meniscal tear of left knee 01/19/2013  . Blood transfusion without reported diagnosis   . Depression   . Diabetes mellitus    borderline  . DJD (degenerative joint disease)   . GERD (gastroesophageal reflux disease)   . Hyperglycemia   . Hypertension   . Peripheral neuropathy   . Restless leg   . Syncope and collapse      Social History   Socioeconomic History  . Marital status: Married    Spouse name: Not on file  . Number of children: 2  . Years of education: Not on file  . Highest education level: Not on file  Occupational History  . Occupation: retired  Engineer, production  . Financial resource strain: Not on file  . Food insecurity:    Worry: Not on file    Inability: Not on file  . Transportation needs:    Medical: Not on file    Non-medical: Not on file  Tobacco Use  . Smoking status: Former Smoker    Packs/day: 1.00    Years: 20.00    Pack years: 20.00    Types: Cigarettes  . Smokeless tobacco: Never  Used  Substance and Sexual Activity  . Alcohol use: Yes    Comment: ocassionally 1 glass of wine per mth  . Drug use: No  . Sexual activity: Never  Lifestyle  . Physical activity:    Days per week: Not on file    Minutes per session: Not on file  . Stress: Not on file  Relationships  . Social connections:    Talks on phone: Not on file    Gets together: Not on file    Attends religious service: Not on file    Active member of club or organization: Not on file    Attends meetings of clubs or organizations: Not on file    Relationship status: Not on file  . Intimate partner violence:    Fear of current or ex partner: Not on file    Emotionally abused: Not on file    Physically abused: Not on file    Forced sexual activity: Not on file  Other Topics Concern  . Not on file  Social History Narrative   Patient would desire CPR.   Would not want feeding tubes or heroic measures.   Does not have HPOA.    Past Surgical History:  Procedure Laterality Date  . APPENDECTOMY  1976  . CARDIAC CATHETERIZATION  2006   @  [place]  . 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 KNEE ARTHROSCOPY WITH MEDIAL MENISECTOMY Left 02/11/2013   Procedure: LEFT KNEE ARTHROSCOPY WITH MEDIAL MENISECTOMY;  Surgeon: Jacki Conesonald A Gioffre, MD;  Location: [facility];  Service: Orthopedics;  Laterality: Left;  Marland Kitchen. MULTIPLE TOOTH EXTRACTIONS    . OMENTECTOMY  07/21/2012   Procedure: OMENTECTOMY;  Surgeon: Adolph Pollackodd J Rosenbower, MD;  Location: [facility];  Service: General;  Laterality: N/A;  . right knee replacement  2008  . TONSILLECTOMY  childhood  . TUBAL LIGATION  1976  . UMBILICAL HERNIA REPAIR  07/21/2012   Procedure: HERNIA REPAIR UMBILICAL ADULT;  Surgeon: Adolph Pollackodd J Rosenbower, MD;  Location: [facility];  Service: General;  Laterality: N/A;    Family History  Problem Relation Age of Onset  . Parkinsonism Mother   . Hypertension Father   . Heart attack Father     No Known Allergies  Current Outpatient  Medications on File Prior to Visit  Medication Sig Dispense Refill  . aspirin 81 MG tablet Take 81 mg by mouth daily.    . Calcium Carb-Cholecalciferol (CALCIUM 500+D PO) Take by mouth.    . esomeprazole (NEXIUM) 20 MG capsule Take 20 mg by mouth every other day.    . metFORMIN (GLUCOPHAGE-XR) 500 MG 24 hr tablet TAKE 1 TABLET BY MOUTH EVERY DAY WITH BREAKFAST 90 tablet 0  . metoprolol succinate (TOPROL-XL) 50 MG 24 hr tablet TAKE 1 TABLET BY MOUTH DAILY WITH OR IMMEDIATELY FOLLOWING A MEAL. 90 tablet 0  . polyethylene glycol powder (GLYCOLAX/MIRALAX) powder Take 1 Container as needed by mouth. With Juice    . ramipril (ALTACE) 5 MG capsule TAKE 1 CAPSULE (5 MG TOTAL) BY MOUTH DAILY FOR BLOOD PRESSURE. 90 capsule 1  . triamterene-hydrochlorothiazide (MAXZIDE-25) 37.5-25 MG tablet TAKE 1 TABLET BY MOUTH ONCE DAILY FOR BLOOD PRESSURE. 90 tablet 1   No current facility-administered medications on file prior to visit.     BP 126/80   Pulse 78   Temp 97.8 F (36.6 C) (Oral)   Ht 5' 2.5" (1.588 m)   Wt 238 lb 8 oz (108.2 kg)   SpO2 97%   BMI 42.93 kg/m    Objective:   Physical Exam  Constitutional: She appears well-nourished.  Respiratory: Effort normal.  Skin: Skin is dry.     Open blister to right anterior lower extremity proximal to ankle, wide spread surrounding light erythema. Several intact blisters, smaller, distal to large open blister.            Assessment & Plan:

## 2019-01-13 NOTE — Patient Instructions (Signed)
Start Cephalexin antibiotics for the infection. Take 1 capsule by mouth three times daily for 7 days.  Dress your wound as discussed. Use the non stick pads to help.  Please call me in 3-4 days if no improvement.  It was a pleasure to see you today!

## 2019-01-20 ENCOUNTER — Ambulatory Visit: Payer: Medicare HMO

## 2019-01-27 ENCOUNTER — Encounter: Payer: Medicare HMO | Admitting: Primary Care

## 2019-01-29 DIAGNOSIS — H40053 Ocular hypertension, bilateral: Secondary | ICD-10-CM | POA: Diagnosis not present

## 2019-01-29 DIAGNOSIS — H401490 Capsular glaucoma with pseudoexfoliation of lens, unspecified eye, stage unspecified: Secondary | ICD-10-CM | POA: Diagnosis not present

## 2019-01-29 DIAGNOSIS — H40023 Open angle with borderline findings, high risk, bilateral: Secondary | ICD-10-CM | POA: Diagnosis not present

## 2019-01-29 DIAGNOSIS — H353121 Nonexudative age-related macular degeneration, left eye, early dry stage: Secondary | ICD-10-CM | POA: Diagnosis not present

## 2019-01-29 DIAGNOSIS — H278 Other specified disorders of lens: Secondary | ICD-10-CM | POA: Diagnosis not present

## 2019-02-03 ENCOUNTER — Ambulatory Visit (INDEPENDENT_AMBULATORY_CARE_PROVIDER_SITE_OTHER): Payer: Medicare HMO | Admitting: Primary Care

## 2019-02-03 ENCOUNTER — Encounter: Payer: Self-pay | Admitting: Primary Care

## 2019-02-03 ENCOUNTER — Other Ambulatory Visit: Payer: Self-pay

## 2019-02-03 VITALS — BP 126/82 | HR 80 | Temp 97.7°F | Ht 62.5 in | Wt 240.0 lb

## 2019-02-03 DIAGNOSIS — I10 Essential (primary) hypertension: Secondary | ICD-10-CM | POA: Diagnosis not present

## 2019-02-03 DIAGNOSIS — E1142 Type 2 diabetes mellitus with diabetic polyneuropathy: Secondary | ICD-10-CM | POA: Diagnosis not present

## 2019-02-03 DIAGNOSIS — L03115 Cellulitis of right lower limb: Secondary | ICD-10-CM

## 2019-02-03 DIAGNOSIS — R69 Illness, unspecified: Secondary | ICD-10-CM | POA: Diagnosis not present

## 2019-02-03 DIAGNOSIS — K219 Gastro-esophageal reflux disease without esophagitis: Secondary | ICD-10-CM

## 2019-02-03 DIAGNOSIS — F32A Depression, unspecified: Secondary | ICD-10-CM

## 2019-02-03 DIAGNOSIS — N939 Abnormal uterine and vaginal bleeding, unspecified: Secondary | ICD-10-CM

## 2019-02-03 DIAGNOSIS — F329 Major depressive disorder, single episode, unspecified: Secondary | ICD-10-CM

## 2019-02-03 DIAGNOSIS — Z0000 Encounter for general adult medical examination without abnormal findings: Secondary | ICD-10-CM

## 2019-02-03 DIAGNOSIS — R600 Localized edema: Secondary | ICD-10-CM | POA: Diagnosis not present

## 2019-02-03 HISTORY — DX: Abnormal uterine and vaginal bleeding, unspecified: N93.9

## 2019-02-03 LAB — POC URINALSYSI DIPSTICK (AUTOMATED)
Bilirubin, UA: NEGATIVE
Blood, UA: NEGATIVE
Glucose, UA: NEGATIVE
Ketones, UA: NEGATIVE
Leukocytes, UA: NEGATIVE
Nitrite, UA: NEGATIVE
Protein, UA: NEGATIVE
Spec Grav, UA: >=1.03 — AB (ref 1.010–1.025)
Urobilinogen, UA: 0.2 E.U./dL
pH, UA: 5.5 (ref 5.0–8.0)

## 2019-02-03 LAB — LIPID PANEL
Cholesterol: 142 mg/dL (ref 0–200)
HDL: 55.9 mg/dL (ref >39.00)
LDL Cholesterol: 51 mg/dL (ref 0–99)
NonHDL: 86.08
Total CHOL/HDL Ratio: 3
Triglycerides: 177 mg/dL — ABNORMAL HIGH (ref 0.0–149.0)
VLDL: 35.4 mg/dL (ref 0.0–40.0)

## 2019-02-03 LAB — COMPREHENSIVE METABOLIC PANEL
ALT: 25 U/L (ref 0–35)
AST: 21 U/L (ref 0–37)
Albumin: 4.3 g/dL (ref 3.5–5.2)
Alkaline Phosphatase: 82 U/L (ref 39–117)
BUN: 13 mg/dL (ref 6–23)
CO2: 27 mEq/L (ref 19–32)
Calcium: 10 mg/dL (ref 8.4–10.5)
Chloride: 104 mEq/L (ref 96–112)
Creatinine, Ser: 0.82 mg/dL (ref 0.40–1.20)
GFR: 68.02 mL/min (ref >60.00)
Glucose, Bld: 104 mg/dL — ABNORMAL HIGH (ref 70–99)
Potassium: 3.8 mEq/L (ref 3.5–5.1)
Sodium: 140 mEq/L (ref 135–145)
Total Bilirubin: 0.6 mg/dL (ref 0.2–1.2)
Total Protein: 6.7 g/dL (ref 6.0–8.3)

## 2019-02-03 LAB — CBC
HCT: 42.5 % (ref 36.0–46.0)
Hemoglobin: 14.2 g/dL (ref 12.0–15.0)
MCHC: 33.3 g/dL (ref 30.0–36.0)
MCV: 89.6 fl (ref 78.0–100.0)
Platelets: 263 10*3/uL (ref 150.0–400.0)
RBC: 4.74 Mil/uL (ref 3.87–5.11)
RDW: 13.5 % (ref 11.5–15.5)
WBC: 8.9 10*3/uL (ref 4.0–10.5)

## 2019-02-03 LAB — BRAIN NATRIURETIC PEPTIDE: Pro B Natriuretic peptide (BNP): 171 pg/mL — ABNORMAL HIGH (ref 0.0–100.0)

## 2019-02-03 LAB — HEMOGLOBIN A1C: Hgb A1c MFr Bld: 5.7 % (ref 4.6–6.5)

## 2019-02-03 NOTE — Assessment & Plan Note (Addendum)
Compliant to Metformin daily as prescribed.  Eating junk food and is not exercising. Encouraged her to work on walking and improving her diet.   Managed on ACE, no statin. Lipid panel pending. Pneumonia vaccination UTD. Foot and eye exam UTD. Repeat A1C pending.

## 2019-02-03 NOTE — Progress Notes (Addendum)
Subjective:    Patient ID: [name], female    DOB: 1945-11-03, 73 y.o.   MRN: [id number]  HPI  [name] is a 73 year old female who presents today for complete physical, she also reports vaginal bleeding.  Immunizations: -Influenza: Completed last season -Pneumonia: Completed in 2016, 2013 -Shingles: Completed Zostavax in 2015  Diet: She endorses a poor diet with junk food and sweets. She is not drinking much water. Also drinks diet soda and sugar free tea/lemonade, milk with cereal.  Exercise: She is not exercising  Eye exam: Completed in June 2020, December 2019 Dental exam: No recent exam Colonoscopy: Completed Cologuard in 2018 Mammogram: No recent mammogram. Declines.  Dexa: Completed in 2019 Hep C Screen: Negative in 2017  BP Readings from Last 3 Encounters:  02/03/19 126/82  01/13/19 126/80  07/28/18 126/82   Intermittent brownish vaginal discharge since late May 2020, several days ago she started noticing bright red and light pink tinges in her underwear and on the toilet paper when wiping after urinating. This doesn't occur with bowel movements, but she does have a history of hemorrhoids. She's not noticed any brown or red/pink discharge for the last four days. She has her uterus and ovaries.    Review of Systems  Constitutional: Negative for unexpected weight change.  HENT: Negative for rhinorrhea.   Eyes: Negative for visual disturbance.  Respiratory: Negative for cough and shortness of breath.   Cardiovascular: Positive for leg swelling. Negative for chest pain.  Gastrointestinal: Negative for constipation and diarrhea.  Genitourinary: Positive for vaginal bleeding. Negative for difficulty urinating.  Musculoskeletal: Positive for arthralgias.  Skin: Negative for rash.  Allergic/Immunologic: Negative for environmental allergies.  Neurological: Positive for numbness. Negative for dizziness and headaches.       Chronic neuropathy        Past  Medical History:  Diagnosis Date  . Acute meniscal tear of left knee 01/19/2013  . Blood transfusion without reported diagnosis   . Depression   . Diabetes mellitus    borderline  . DJD (degenerative joint disease)   . GERD (gastroesophageal reflux disease)   . Hyperglycemia   . Hypertension   . Peripheral neuropathy   . Restless leg   . Syncope and collapse      Social History   Socioeconomic History  . Marital status: Married    Spouse name: Not on file  . Number of children: 2  . Years of education: Not on file  . Highest education level: Not on file  Occupational History  . Occupation: retired  Scientific laboratory technician  . Financial resource strain: Not on file  . Food insecurity:    Worry: Not on file    Inability: Not on file  . Transportation needs:    Medical: Not on file    Non-medical: Not on file  Tobacco Use  . Smoking status: Former Smoker    Packs/day: 1.00    Years: 20.00    Pack years: 20.00    Types: Cigarettes  . Smokeless tobacco: Never Used  Substance and Sexual Activity  . Alcohol use: Yes    Comment: ocassionally 1 glass of wine per mth  . Drug use: No  . Sexual activity: Never  Lifestyle  . Physical activity:    Days per week: Not on file    Minutes per session: Not on file  . Stress: Not on file  Relationships  . Social connections:    Talks on phone: Not on  file    Gets together: Not on file    Attends religious service: Not on file    Active member of club or organization: Not on file    Attends meetings of clubs or organizations: Not on file    Relationship status: Not on file  . Intimate partner violence:    Fear of current or ex partner: Not on file    Emotionally abused: Not on file    Physically abused: Not on file    Forced sexual activity: Not on file  Other Topics Concern  . Not on file  Social History Narrative   Patient would desire CPR.   Would not want feeding tubes or heroic measures.   Does not have HPOA.    Past  Surgical History:  Procedure Laterality Date  . APPENDECTOMY  1976  . CARDIAC CATHETERIZATION  2006   @ [facility]  . 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 KNEE ARTHROSCOPY WITH MEDIAL MENISECTOMY Left 02/11/2013   Procedure: LEFT KNEE ARTHROSCOPY WITH MEDIAL MENISECTOMY;  Surgeon: Jacki Conesonald A Gioffre, MD;  Location: [facility];  Service: Orthopedics;  Laterality: Left;  Marland Kitchen. MULTIPLE TOOTH EXTRACTIONS    . OMENTECTOMY  07/21/2012   Procedure: OMENTECTOMY;  Surgeon: Adolph Pollackodd J Rosenbower, MD;  Location: [facility];  Service: General;  Laterality: N/A;  . right knee replacement  2008  . TONSILLECTOMY  childhood  . TUBAL LIGATION  1976  . UMBILICAL HERNIA REPAIR  07/21/2012   Procedure: HERNIA REPAIR UMBILICAL ADULT;  Surgeon: Adolph Pollackodd J Rosenbower, MD;  Location: [facility];  Service: General;  Laterality: N/A;    Family History  Problem Relation Age of Onset  . Parkinsonism Mother   . Hypertension Father   . Heart attack Father     No Known Allergies  Current Outpatient Medications on File Prior to Visit  Medication Sig Dispense Refill  . aspirin 81 MG tablet Take 81 mg by mouth daily.    . Calcium Carb-Cholecalciferol (CALCIUM 500+D PO) Take by mouth.    . esomeprazole (NEXIUM) 20 MG capsule Take 20 mg by mouth every other day.    . metFORMIN (GLUCOPHAGE-XR) 500 MG 24 hr tablet TAKE 1 TABLET BY MOUTH EVERY DAY WITH BREAKFAST 90 tablet 0  . metoprolol succinate (TOPROL-XL) 50 MG 24 hr tablet TAKE 1 TABLET BY MOUTH DAILY WITH OR IMMEDIATELY FOLLOWING A MEAL. 90 tablet 0  . ramipril (ALTACE) 5 MG capsule TAKE 1 CAPSULE (5 MG TOTAL) BY MOUTH DAILY FOR BLOOD PRESSURE. 90 capsule 1  . triamterene-hydrochlorothiazide (MAXZIDE-25) 37.5-25 MG tablet TAKE 1 TABLET BY MOUTH ONCE DAILY FOR BLOOD PRESSURE. 90 tablet 1  . vitamin B-12 (CYANOCOBALAMIN) 1000 MCG tablet Take 2,000 mcg by mouth daily.      No current facility-administered medications on file prior to visit.     BP 126/82   Pulse 80   Temp  97.7 F (36.5 C) (Tympanic)   Ht 5' 2.5" (1.588 m)   Wt 240 lb (108.9 kg)   SpO2 98%   BMI 43.20 kg/m    Objective:   Physical Exam  Constitutional: She is oriented to person, place, and time. She appears well-nourished.  HENT:  Mouth/Throat: No oropharyngeal exudate.  Eyes: Pupils are equal, round, and reactive to light. EOM are normal.  Neck: Neck supple. No thyromegaly present.  Cardiovascular: Normal rate and regular rhythm.  Moderate bilateral lower extremity edema with trace pitting. Chronic.   Respiratory: Effort normal and breath sounds  normal.  GI: Soft. Bowel sounds are normal. There is no abdominal tenderness.  Musculoskeletal:     Comments: Ambulatory in office with cane  Neurological: She is alert and oriented to person, place, and time.  Skin: Skin is warm and dry. No erythema.  Cellulitis resolved   Psychiatric: She has a normal mood and affect.           Assessment & Plan:

## 2019-02-03 NOTE — Assessment & Plan Note (Signed)
Not following with podiatry when referred last year.  Strongly advised that she always wear protective footwear when indoors and outdoors.

## 2019-02-03 NOTE — Patient Instructions (Signed)
You must elevate your legs when resting. This will help to reduce swelling.  Try to get up and walk for 10 minutes every hour to help with circulation.  Stop by the lab prior to leaving today. I will notify you of your results once received.   It is important that you improve your diet. Please limit carbohydrates in the form of white bread, rice, pasta, sweets, fast food, fried food, sugary drinks, etc. Increase your consumption of fresh fruits and vegetables, whole grains, lean protein.  Ensure you are consuming 64 ounces of water daily.  Please schedule a follow up appointment in 6 months for diabetes check.   It was a pleasure to see you today!

## 2019-02-03 NOTE — Assessment & Plan Note (Signed)
Chronic. Definitely prominent today with trace pitting. Strongly advised she elevate her legs when resting. Continue Maxzide.  Check BMP, BNP today. No echocardiogram on file.

## 2019-02-03 NOTE — Assessment & Plan Note (Signed)
Immunizations UTD. Declines mammogram. Colon cancer screening UTD. Advised her to work on weight loss through diet and exercise.  Exam stable. Labs pending. Follow up in 1 year for CPE.

## 2019-02-03 NOTE — Assessment & Plan Note (Signed)
Unclear whether this is vaginal or urinary etiology. She declines pelvic exam. UA today negative. Recommend pelvic ultrasound for further evaluation of fibroids/lesions, she kindly declines.  She will monitor symptoms and update if they return.

## 2019-02-03 NOTE — Assessment & Plan Note (Signed)
Stable in the office today. Continue Maxzide, metoprolol succinate, and Altace. She has had intermittent lower extremity edema.  BMP pending.

## 2019-02-03 NOTE — Assessment & Plan Note (Signed)
Doing well on Nexium, continue same.  

## 2019-02-03 NOTE — Assessment & Plan Note (Signed)
Resolved

## 2019-02-03 NOTE — Assessment & Plan Note (Signed)
Overall stable, denies concerns.  

## 2019-02-06 ENCOUNTER — Other Ambulatory Visit: Payer: Self-pay | Admitting: Primary Care

## 2019-02-06 DIAGNOSIS — R600 Localized edema: Secondary | ICD-10-CM

## 2019-02-06 MED ORDER — FUROSEMIDE 20 MG PO TABS: 20.0000 mg | ORAL_TABLET | Freq: Every day | ORAL | 0 refills | Status: DC

## 2019-02-23 ENCOUNTER — Ambulatory Visit (INDEPENDENT_AMBULATORY_CARE_PROVIDER_SITE_OTHER): Payer: Medicare HMO | Admitting: Primary Care

## 2019-02-23 ENCOUNTER — Telehealth: Payer: Self-pay | Admitting: Primary Care

## 2019-02-23 DIAGNOSIS — R600 Localized edema: Secondary | ICD-10-CM | POA: Diagnosis not present

## 2019-02-23 NOTE — Patient Instructions (Signed)
Call the main line to schedule your lab and blood pressure check.  Complete your echocardiogram in July as scheduled.  I'll be in touch soon with results.  It was a pleasure to see you today!

## 2019-02-23 NOTE — Progress Notes (Signed)
Subjective:    Patient ID: [name], female    DOB: May 28, 1946, 73 y.o.   MRN: [id number]  HPI  Virtual Visit via Video Note  I connected with [name] on 02/23/19 at  2:00 PM EDT by a video enabled telemedicine application and verified that I am speaking with the correct person using two identifiers.  Location: Patient: Home Provider: Office   I discussed the limitations of evaluation and management by telemedicine and the availability of in person appointments. The patient expressed understanding and agreed to proceed.  History of Present Illness:  [name] is a 73 year old female with a history of hypertension, morbid obesity, type 2 diabetes, peripheral neuropathy, restless leg syndrome, chronic lower extremity edema who presents today for follow up.  She was last evaluated in early June for her CPE and endorsed increased chronic lower extremity edema bilaterally. She was managed on triamteren-HCTZ 37.5-25 mg at the time and continued to experience swelling. Given her continued bilateral edema we stopped her Maxzide and started furosemide 20 mg daily. We also placed an order for echocardiogram. She was asked to follow up today.  Since her last visit she's not noticed any reduction in bilateral lower extremity edema with furosemide 20 mg. She recently returned from [facility] with her family, did some walking on the beach. She's not checked her BP as she has no way to check. Her swelling is no worse but is also no better.   Observations/Objective:  Alert and oriented. Appears well, not sickly. No distress. Speaking in complete sentences.  Bilateral lower extremity edema noted, appears to be about the same as it was during her last visit. No open wounds. Bubbling to the surface from skin weeping.  Assessment and Plan:  Little to no improvement with switch from Maxzide to furosemide 20 mg. Will start with BMP check for which she will schedule today.  Will also have her BP checked with nurse visit. If labs and BP stable, then will increase furosemide dose to 40 mg daily while we await for her echocardiogram to be completed as scheduled on March 04, 2019.  Follow Up Instructions:  Call the main line to schedule your lab and blood pressure check.  Complete your echocardiogram in July as scheduled.  I'll be in touch soon with results.  It was a pleasure to see you today!    I discussed the assessment and treatment plan with the patient. The patient was provided an opportunity to ask questions and all were answered. The patient agreed with the plan and demonstrated an understanding of the instructions.   The patient was advised to call back or seek an in-person evaluation if the symptoms worsen or if the condition fails to improve as anticipated.     Pleas Koch, NP    Review of Systems  Constitutional: Negative for fever.  Respiratory: Negative for shortness of breath.   Cardiovascular: Positive for leg swelling. Negative for chest pain.  Skin: Negative for color change and wound.       Past Medical History:  Diagnosis Date  . Acute meniscal tear of left knee 01/19/2013  . Blood transfusion without reported diagnosis   . Depression   . Diabetes mellitus    borderline  . DJD (degenerative joint disease)   . GERD (gastroesophageal reflux disease)   . Hyperglycemia   . Hypertension   . Peripheral neuropathy   . Restless leg   . Syncope and collapse  Social History   Socioeconomic History  . Marital status: Married    Spouse name: Not on file  . Number of children: 2  . Years of education: Not on file  . Highest education level: Not on file  Occupational History  . Occupation: retired  Engineer, productionocial Needs  . Financial resource strain: Not on file  . Food insecurity    Worry: Not on file    Inability: Not on file  . Transportation needs    Medical: Not on file    Non-medical: Not on file  Tobacco Use  .  Smoking status: Former Smoker    Packs/day: 1.00    Years: 20.00    Pack years: 20.00    Types: Cigarettes  . Smokeless tobacco: Never Used  Substance and Sexual Activity  . Alcohol use: Yes    Comment: ocassionally 1 glass of wine per mth  . Drug use: No  . Sexual activity: Never  Lifestyle  . Physical activity    Days per week: Not on file    Minutes per session: Not on file  . Stress: Not on file  Relationships  . Social Musicianconnections    Talks on phone: Not on file    Gets together: Not on file    Attends religious service: Not on file    Active member of club or organization: Not on file    Attends meetings of clubs or organizations: Not on file    Relationship status: Not on file  . Intimate partner violence    Fear of current or ex partner: Not on file    Emotionally abused: Not on file    Physically abused: Not on file    Forced sexual activity: Not on file  Other Topics Concern  . Not on file  Social History Narrative   Patient would desire CPR.   Would not want feeding tubes or heroic measures.   Does not have HPOA.    Past Surgical History:  Procedure Laterality Date  . APPENDECTOMY  1976  . CARDIAC CATHETERIZATION  2006   @ [facility]  . 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 KNEE ARTHROSCOPY WITH MEDIAL MENISECTOMY Left 02/11/2013   Procedure: LEFT KNEE ARTHROSCOPY WITH MEDIAL MENISECTOMY;  Surgeon: Jacki Conesonald A Gioffre, MD;  Location: [facility];  Service: Orthopedics;  Laterality: Left;  Marland Kitchen. MULTIPLE TOOTH EXTRACTIONS    . OMENTECTOMY  07/21/2012   Procedure: OMENTECTOMY;  Surgeon: Adolph Pollackodd J Rosenbower, MD;  Location: [facility];  Service: General;  Laterality: N/A;  . right knee replacement  2008  . TONSILLECTOMY  childhood  . TUBAL LIGATION  1976  . UMBILICAL HERNIA REPAIR  07/21/2012   Procedure: HERNIA REPAIR UMBILICAL ADULT;  Surgeon: Adolph Pollackodd J Rosenbower, MD;  Location: [facility];  Service: General;  Laterality: N/A;    Family History  Problem Relation Age  of Onset  . Parkinsonism Mother   . Hypertension Father   . Heart attack Father     No Known Allergies  Current Outpatient Medications on File Prior to Visit  Medication Sig Dispense Refill  . aspirin 81 MG tablet Take 81 mg by mouth daily.    . Calcium Carb-Cholecalciferol (CALCIUM 500+D PO) Take by mouth.    . esomeprazole (NEXIUM) 20 MG capsule Take 20 mg by mouth every other day.    . furosemide (LASIX) 20 MG tablet Take 1 tablet (20 mg total) by mouth daily. For leg swelling. 30 tablet 0  .  metFORMIN (GLUCOPHAGE-XR) 500 MG 24 hr tablet TAKE 1 TABLET BY MOUTH EVERY DAY WITH BREAKFAST 90 tablet 0  . metoprolol succinate (TOPROL-XL) 50 MG 24 hr tablet TAKE 1 TABLET BY MOUTH DAILY WITH OR IMMEDIATELY FOLLOWING A MEAL. 90 tablet 0  . ramipril (ALTACE) 5 MG capsule TAKE 1 CAPSULE (5 MG TOTAL) BY MOUTH DAILY FOR BLOOD PRESSURE. 90 capsule 1  . vitamin B-12 (CYANOCOBALAMIN) 1000 MCG tablet Take 2,000 mcg by mouth daily.      No current facility-administered medications on file prior to visit.     There were no vitals taken for this visit.   Objective:   Physical Exam  Constitutional: She is oriented to person, place, and time. She appears well-nourished.  Respiratory: Effort normal.  Neurological: She is alert and oriented to person, place, and time.  Skin:  Bilateral lower extremity edema noted, appears to be about the same as it was during her last visit. No open wounds. Bubbling to the surface from skin weeping.  Psychiatric: She has a normal mood and affect.           Assessment & Plan:

## 2019-02-23 NOTE — Assessment & Plan Note (Signed)
Little to no improvement with switch from Maxzide to furosemide 20 mg. Will start with BMP check for which she will schedule today. Will also have her BP checked with nurse visit. If labs and BP stable, then will increase furosemide dose to 40 mg daily while we await for her echocardiogram to be completed as scheduled on March 04, 2019.

## 2019-02-23 NOTE — Telephone Encounter (Signed)
I need her labs ASAP, I also need BP checked.  [name], can you check her BP if she comes today for labs? Please send to [facility] for scheduling if you can do this today, if not then she can come tomorrow.

## 2019-02-23 NOTE — Telephone Encounter (Signed)
Patient called to schedule labs and BP check. She stated she believed she was needing to have these done today so she could have a new medication prescribed.I wanted to clarify when the patient needed to be schedule since we do not have a nurse schedule on mondays. Will Chan do the BP check patient is needing?    Thanks!

## 2019-02-23 NOTE — Telephone Encounter (Signed)
Spoken to patient and schedule lab/nurse appt on 02/24/2019

## 2019-02-24 ENCOUNTER — Other Ambulatory Visit (INDEPENDENT_AMBULATORY_CARE_PROVIDER_SITE_OTHER): Payer: Medicare HMO

## 2019-02-24 ENCOUNTER — Ambulatory Visit: Payer: Medicare HMO | Admitting: *Deleted

## 2019-02-24 ENCOUNTER — Other Ambulatory Visit: Payer: Self-pay

## 2019-02-24 ENCOUNTER — Other Ambulatory Visit: Payer: Self-pay | Admitting: Primary Care

## 2019-02-24 VITALS — BP 126/78 | HR 70 | Temp 98.5°F

## 2019-02-24 DIAGNOSIS — I10 Essential (primary) hypertension: Secondary | ICD-10-CM

## 2019-02-24 DIAGNOSIS — R600 Localized edema: Secondary | ICD-10-CM

## 2019-02-24 LAB — BASIC METABOLIC PANEL
BUN: 11 mg/dL (ref 6–23)
CO2: 27 mEq/L (ref 19–32)
Calcium: 9.4 mg/dL (ref 8.4–10.5)
Chloride: 106 mEq/L (ref 96–112)
Creatinine, Ser: 0.77 mg/dL (ref 0.40–1.20)
GFR: 73.13 mL/min (ref >60.00)
Glucose, Bld: 110 mg/dL — ABNORMAL HIGH (ref 70–99)
Potassium: 3.6 mEq/L (ref 3.5–5.1)
Sodium: 142 mEq/L (ref 135–145)

## 2019-02-24 MED ORDER — FUROSEMIDE 20 MG PO TABS: 40.0000 mg | ORAL_TABLET | Freq: Every day | ORAL | 0 refills | Status: DC

## 2019-02-24 NOTE — Progress Notes (Signed)
Per Tawni Millers encounter order on 02/23/2019, patient presents today for a nurse visit blood pressure check for ongoing follow up and management.  Vital Sign Readings today BP 126/78.

## 2019-03-01 ENCOUNTER — Other Ambulatory Visit: Payer: Self-pay | Admitting: Primary Care

## 2019-03-01 DIAGNOSIS — R600 Localized edema: Secondary | ICD-10-CM

## 2019-03-03 ENCOUNTER — Telehealth: Payer: Self-pay

## 2019-03-03 NOTE — Telephone Encounter (Signed)
° ° °  COVID-19 Pre-Screening Questions:   In the past 7 to 10 days have you had a cough,  shortness of breath, headache, congestion, fever (100 or greater) body aches, chills, sore throat, or sudden loss of taste or sense of smell? States she has allergies   Have you been around anyone with known Covid 19. No however her two daughters were tested and both negative but symptomatic and patient recently returned from [facility] on 6/30  Have you been around anyone who is awaiting Covid 19 test results in the past 7 to 10 days? no  Have you been around anyone who has been exposed to Covid 19, or has mentioned symptoms of Covid 19 within the past 7 to 10 days? Nobody other than the daughters that were tested  If you have any concerns/questions about symptoms patients report during screening (either on the phone or at threshold). Contact the provider seeing the patient or DOD for further guidance.  If neither are available contact a member of the leadership team.    If you would just review this information and see if you feel patient will still be able to come in for appointment or should reschedule. Thank you

## 2019-03-03 NOTE — Telephone Encounter (Signed)

## 2019-03-04 ENCOUNTER — Other Ambulatory Visit: Payer: Self-pay

## 2019-03-04 ENCOUNTER — Ambulatory Visit (INDEPENDENT_AMBULATORY_CARE_PROVIDER_SITE_OTHER): Payer: Medicare HMO

## 2019-03-04 DIAGNOSIS — R600 Localized edema: Secondary | ICD-10-CM | POA: Diagnosis not present

## 2019-03-19 ENCOUNTER — Other Ambulatory Visit: Payer: Self-pay | Admitting: Primary Care

## 2019-03-19 DIAGNOSIS — R600 Localized edema: Secondary | ICD-10-CM

## 2019-03-20 NOTE — Telephone Encounter (Signed)
Last prescribed on 02/24/2019. Last appointment on 02/23/2019. Next future appointment on 03/23/2019

## 2019-03-23 ENCOUNTER — Telehealth: Payer: Self-pay | Admitting: Primary Care

## 2019-03-23 ENCOUNTER — Ambulatory Visit: Payer: Medicare HMO | Admitting: Primary Care

## 2019-03-23 NOTE — Telephone Encounter (Signed)
Patient called.  She had an appointment with Anda Kraft this morning at 11:00 and forgot about the appointment. She said she was confused.  Patient rescheduled her appointment to tomorrow at 11:40.  Can patient's appointment for today be cancelled?

## 2019-03-23 NOTE — Telephone Encounter (Signed)
Appointment cancelled

## 2019-03-23 NOTE — Telephone Encounter (Signed)
Yes, please. Thank you.

## 2019-03-24 ENCOUNTER — Encounter: Payer: Self-pay | Admitting: Primary Care

## 2019-03-24 ENCOUNTER — Other Ambulatory Visit: Payer: Self-pay

## 2019-03-24 ENCOUNTER — Ambulatory Visit (INDEPENDENT_AMBULATORY_CARE_PROVIDER_SITE_OTHER): Payer: Medicare HMO | Admitting: Primary Care

## 2019-03-24 DIAGNOSIS — R600 Localized edema: Secondary | ICD-10-CM

## 2019-03-24 LAB — BASIC METABOLIC PANEL
BUN: 17 mg/dL (ref 6–23)
CO2: 29 mEq/L (ref 19–32)
Calcium: 10 mg/dL (ref 8.4–10.5)
Chloride: 105 mEq/L (ref 96–112)
Creatinine, Ser: 0.82 mg/dL (ref 0.40–1.20)
GFR: 68 mL/min (ref >60.00)
Glucose, Bld: 107 mg/dL — ABNORMAL HIGH (ref 70–99)
Potassium: 3.8 mEq/L (ref 3.5–5.1)
Sodium: 141 mEq/L (ref 135–145)

## 2019-03-24 MED ORDER — FUROSEMIDE 20 MG PO TABS: 40.0000 mg | ORAL_TABLET | Freq: Every day | ORAL | 1 refills | Status: DC

## 2019-03-24 NOTE — Patient Instructions (Signed)
Stop by the lab prior to leaving today. I will notify you of your results once received.   Continue taking furosemide tablets for leg swelling, take two tablets once daily.  It was a pleasure to see you today!

## 2019-03-24 NOTE — Progress Notes (Signed)
Subjective:    Patient ID: [name], female    DOB: [birth year], 73 y.o.   MRN: [id number]  HPI  [name] is a 73 year old female with a history of hypertension, GERD, diabetes, obesity, lower extremity edema who presents today for follow up of lower extremity edema.  Wt Readings from Last 3 Encounters:  03/24/19 242 lb 8 oz (110 kg)  02/03/19 240 lb (108.9 kg)  01/13/19 238 lb 8 oz (108.2 kg)   She was last evaluated virtually on 02/23/19 for follow up of lower extremity edema. During that visit she denied any improvement in lower extremity edema on furosemide 20 mg. We had her come in for BP check and BMP the following day which was 126/78 and within normal range. Given normal readings and lack of improvement with edema her furosemide was increased to 40 mg and she was asked to follow up today.  Since her last visit (and dose increase) she's noticed some improvement in ankle edema and lower extremity edema. She is compliant to her furosemide 40 mg daily and is overall pleased with her results. She denies dizziness.  She does still elevate her legs when possible.  She is working on some activity during the day with some walking.  BP Readings from Last 3 Encounters:  03/24/19 126/76  02/24/19 126/78  02/03/19 126/82     Review of Systems  Cardiovascular: Positive for leg swelling.  Skin: Negative for color change.  Neurological: Negative for dizziness and light-headedness.       Past Medical History:  Diagnosis Date  . Acute meniscal tear of left knee 01/19/2013  . Blood transfusion without reported diagnosis   . Depression   . Diabetes mellitus    borderline  . DJD (degenerative joint disease)   . GERD (gastroesophageal reflux disease)   . Hyperglycemia   . Hypertension   . Peripheral neuropathy   . Restless leg   . Syncope and collapse      Social History   Socioeconomic History  . Marital status: Married    Spouse name: Not on file  . Number of  children: 2  . Years of education: Not on file  . Highest education level: Not on file  Occupational History  . Occupation: retired  Scientific laboratory technician  . Financial resource strain: Not on file  . Food insecurity    Worry: Not on file    Inability: Not on file  . Transportation needs    Medical: Not on file    Non-medical: Not on file  Tobacco Use  . Smoking status: Former Smoker    Packs/day: 1.00    Years: 20.00    Pack years: 20.00    Types: Cigarettes  . Smokeless tobacco: Never Used  Substance and Sexual Activity  . Alcohol use: Yes    Comment: ocassionally 1 glass of wine per mth  . Drug use: No  . Sexual activity: Never  Lifestyle  . Physical activity    Days per week: Not on file    Minutes per session: Not on file  . Stress: Not on file  Relationships  . Social Herbalist on phone: Not on file    Gets together: Not on file    Attends religious service: Not on file    Active member of club or organization: Not on file    Attends meetings of clubs or organizations: Not on file    Relationship status: Not on file  .  Intimate partner violence    Fear of current or ex partner: Not on file    Emotionally abused: Not on file    Physically abused: Not on file    Forced sexual activity: Not on file  Other Topics Concern  . Not on file  Social History Narrative   Patient would desire CPR.   Would not want feeding tubes or heroic measures.   Does not have HPOA.    Past Surgical History:  Procedure Laterality Date  . APPENDECTOMY  1976  . CARDIAC CATHETERIZATION  2006   @ [facility]  . 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 KNEE ARTHROSCOPY WITH MEDIAL MENISECTOMY Left 02/11/2013   Procedure: LEFT KNEE ARTHROSCOPY WITH MEDIAL MENISECTOMY;  Surgeon: Jacki Conesonald A Gioffre, MD;  Location: [facility];  Service: Orthopedics;  Laterality: Left;  Marland Kitchen. MULTIPLE TOOTH EXTRACTIONS    . OMENTECTOMY  07/21/2012   Procedure: OMENTECTOMY;  Surgeon: Adolph Pollackodd J Rosenbower, MD;   Location: [facility];  Service: General;  Laterality: N/A;  . right knee replacement  2008  . TONSILLECTOMY  childhood  . TUBAL LIGATION  1976  . UMBILICAL HERNIA REPAIR  07/21/2012   Procedure: HERNIA REPAIR UMBILICAL ADULT;  Surgeon: Adolph Pollackodd J Rosenbower, MD;  Location: [facility];  Service: General;  Laterality: N/A;    Family History  Problem Relation Age of Onset  . Parkinsonism Mother   . Hypertension Father   . Heart attack Father     No Known Allergies  Current Outpatient Medications on File Prior to Visit  Medication Sig Dispense Refill  . aspirin 81 MG tablet Take 81 mg by mouth daily.    . Calcium Carb-Cholecalciferol (CALCIUM 500+D PO) Take by mouth.    . esomeprazole (NEXIUM) 20 MG capsule Take 20 mg by mouth every other day.    . metFORMIN (GLUCOPHAGE-XR) 500 MG 24 hr tablet TAKE 1 TABLET BY MOUTH EVERY DAY WITH BREAKFAST 90 tablet 0  . metoprolol succinate (TOPROL-XL) 50 MG 24 hr tablet TAKE 1 TABLET BY MOUTH DAILY WITH OR IMMEDIATELY FOLLOWING A MEAL. 90 tablet 0  . ramipril (ALTACE) 5 MG capsule TAKE 1 CAPSULE (5 MG TOTAL) BY MOUTH DAILY FOR BLOOD PRESSURE. 90 capsule 1  . vitamin B-12 (CYANOCOBALAMIN) 1000 MCG tablet Take 2,000 mcg by mouth daily.      No current facility-administered medications on file prior to visit.     BP 126/76   Pulse 81   Temp 97.8 F (36.6 C) (Temporal)   Ht 5' 2.5" (1.588 m)   Wt 242 lb 8 oz (110 kg)   SpO2 99%   BMI 43.65 kg/m    Objective:   Physical Exam  Constitutional: She appears well-nourished.  Neck: Neck supple.  Cardiovascular: Normal rate and regular rhythm.  Mild to moderate lower extremity edema noted bilaterally to mid calf range.  Appears improved compared to last visit.  Trace pitting.  Respiratory: Effort normal and breath sounds normal.  Skin: Skin is warm and dry.           Assessment & Plan:

## 2019-03-24 NOTE — Assessment & Plan Note (Signed)
Improved with dose increase of furosemide to 40 mg. Repeat BMP pending. We will continue at current dose for now, encouraged elevation of extremities and exercise for weight loss.

## 2019-04-06 ENCOUNTER — Other Ambulatory Visit: Payer: Self-pay | Admitting: Primary Care

## 2019-04-06 DIAGNOSIS — E1140 Type 2 diabetes mellitus with diabetic neuropathy, unspecified: Secondary | ICD-10-CM

## 2019-04-06 DIAGNOSIS — I10 Essential (primary) hypertension: Secondary | ICD-10-CM

## 2019-04-24 ENCOUNTER — Other Ambulatory Visit: Payer: Self-pay | Admitting: Primary Care

## 2019-04-24 DIAGNOSIS — I10 Essential (primary) hypertension: Secondary | ICD-10-CM

## 2019-07-28 ENCOUNTER — Encounter: Payer: Self-pay | Admitting: Primary Care

## 2019-07-28 ENCOUNTER — Ambulatory Visit (INDEPENDENT_AMBULATORY_CARE_PROVIDER_SITE_OTHER): Payer: Medicare HMO | Admitting: Primary Care

## 2019-07-28 ENCOUNTER — Other Ambulatory Visit: Payer: Self-pay

## 2019-07-28 VITALS — Temp 98.8°F

## 2019-07-28 DIAGNOSIS — J069 Acute upper respiratory infection, unspecified: Secondary | ICD-10-CM | POA: Diagnosis not present

## 2019-07-28 NOTE — Patient Instructions (Signed)
Please call me if your breathing gets worse, you develop fevers, your cough gets congested and worse.  It was a pleasure to see you today! Allie Bossier, NP-C

## 2019-07-28 NOTE — Progress Notes (Signed)
Subjective:    Patient ID: [name], female    DOB: 02-Mar-1946, 73 y.o.   MRN: [id number]  HPI  Virtual Visit via Video Note  I connected with Susa Griffins on 07/28/19 at  2:40 PM EST by a video enabled telemedicine application and verified that I am speaking with the correct person using two identifiers.  Location: Patient: Home Provider: Office   I discussed the limitations of evaluation and management by telemedicine and the availability of in person appointments. The patient expressed understanding and agreed to proceed.  History of Present Illness:  Ms. Latella is a 73 year old female with a history   Over the last three weeks she's noticed slight increased shortness of breath and fatigue with normal activities, rhinorrhea, post nasal drip, mild dry cough. Last week she had a a decreased sensation of taste/smell for about one week which has resolved. Overall her symptoms have improved and she's just wanting to make sure she's safe to go out in public.   She was with her grandson on Halloween, his family all tested positive for Covid-19 on November 7th after exposure to Covid-19 eating dinner at another family members house. She's not seen them since Halloween. Her other grandson's family was exposed to the side of the family that tested positive, they all tested negative and had no symptoms.   She's not been out except to the grocery store since [place]. She denies fevers, productive cough, increased sputum production. She quit smoking in the 1980's.   Observations/Objective:  Alert and oriented. Appears well, not sickly. No distress. Speaking in complete sentences. No cough during visit.  Assessment and Plan:  Symptoms could have been Covid-19 but she's now three weeks past symptom development and without fevers.  She doesn't appear to have pneumonia or COPD exacerbation. She doesn't appear sickly. She really just wanted to ensure she could come to her  regularly scheduled appointment next week.  Discussed that she is okay to go out of the house as long as she's wearing her mask and abiding by other sanitization protocols.  Return precautions provided.   Follow Up Instructions:  Please call me if your breathing gets worse, you develop fevers, your cough gets congested and worse.  It was a pleasure to see you today! Allie Bossier, NP-C    I discussed the assessment and treatment plan with the patient. The patient was provided an opportunity to ask questions and all were answered. The patient agreed with the plan and demonstrated an understanding of the instructions.   The patient was advised to call back or seek an in-person evaluation if the symptoms worsen or if the condition fails to improve as anticipated.   Pleas Koch, NP    Review of Systems  Constitutional: Positive for fatigue. Negative for chills and fever.  HENT: Positive for congestion and postnasal drip.   Respiratory: Positive for shortness of breath.   Gastrointestinal: Negative for diarrhea.  Allergic/Immunologic: Positive for environmental allergies.  Neurological: Negative for headaches.       Past Medical History:  Diagnosis Date  . Acute meniscal tear of left knee 01/19/2013  . Blood transfusion without reported diagnosis   . Depression   . Diabetes mellitus    borderline  . DJD (degenerative joint disease)   . GERD (gastroesophageal reflux disease)   . Hyperglycemia   . Hypertension   . Peripheral neuropathy   . Restless leg   . Syncope and collapse  Social History   Socioeconomic History  . Marital status: Married    Spouse name: Not on file  . Number of children: 2  . Years of education: Not on file  . Highest education level: Not on file  Occupational History  . Occupation: retired  Engineer, production  . Financial resource strain: Not on file  . Food insecurity    Worry: Not on file    Inability: Not on file  . Transportation  needs    Medical: Not on file    Non-medical: Not on file  Tobacco Use  . Smoking status: Former Smoker    Packs/day: 1.00    Years: 20.00    Pack years: 20.00    Types: Cigarettes  . Smokeless tobacco: Never Used  Substance and Sexual Activity  . Alcohol use: Yes    Comment: ocassionally 1 glass of wine per mth  . Drug use: No  . Sexual activity: Never  Lifestyle  . Physical activity    Days per week: Not on file    Minutes per session: Not on file  . Stress: Not on file  Relationships  . Social Musician on phone: Not on file    Gets together: Not on file    Attends religious service: Not on file    Active member of club or organization: Not on file    Attends meetings of clubs or organizations: Not on file    Relationship status: Not on file  . Intimate partner violence    Fear of current or ex partner: Not on file    Emotionally abused: Not on file    Physically abused: Not on file    Forced sexual activity: Not on file  Other Topics Concern  . Not on file  Social History Narrative   Patient would desire CPR.   Would not want feeding tubes or heroic measures.   Does not have HPOA.    Past Surgical History:  Procedure Laterality Date  . APPENDECTOMY  1976  . CARDIAC CATHETERIZATION  2006   @ [place]  . 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 KNEE ARTHROSCOPY WITH MEDIAL MENISECTOMY Left 02/11/2013   Procedure: LEFT KNEE ARTHROSCOPY WITH MEDIAL MENISECTOMY;  Surgeon: Jacki Cones, MD;  Location: [facility];  Service: Orthopedics;  Laterality: Left;  Marland Kitchen MULTIPLE TOOTH EXTRACTIONS    . OMENTECTOMY  07/21/2012   Procedure: OMENTECTOMY;  Surgeon: Adolph Pollack, MD;  Location: [facility];  Service: General;  Laterality: N/A;  . right knee replacement  2008  . TONSILLECTOMY  childhood  . TUBAL LIGATION  1976  . UMBILICAL HERNIA REPAIR  07/21/2012   Procedure: HERNIA REPAIR UMBILICAL ADULT;  Surgeon: Adolph Pollack, MD;  Location: [facility];   Service: General;  Laterality: N/A;    Family History  Problem Relation Age of Onset  . Parkinsonism Mother   . Hypertension Father   . Heart attack Father     No Known Allergies  Current Outpatient Medications on File Prior to Visit  Medication Sig Dispense Refill  . aspirin 81 MG tablet Take 81 mg by mouth daily.    . Calcium Carb-Cholecalciferol (CALCIUM 500+D PO) Take by mouth.    . esomeprazole (NEXIUM) 20 MG capsule Take 20 mg by mouth every other day.    . furosemide (LASIX) 20 MG tablet Take 2 tablets (40 mg total) by mouth daily. For leg swelling. 180 tablet 1  .  metFORMIN (GLUCOPHAGE-XR) 500 MG 24 hr tablet TAKE 1 TABLET BY MOUTH EVERY DAY WITH BREAKFAST 90 tablet 1  . metoprolol succinate (TOPROL-XL) 50 MG 24 hr tablet TAKE 1 TABLET BY MOUTH DAILY WITH OR IMMEDIATELY FOLLOWING A MEAL. 90 tablet 1  . ramipril (ALTACE) 5 MG capsule TAKE 1 CAPSULE (5 MG TOTAL) BY MOUTH DAILY FOR BLOOD PRESSURE. 90 capsule 1  . vitamin B-12 (CYANOCOBALAMIN) 1000 MCG tablet Take 2,000 mcg by mouth daily.      No current facility-administered medications on file prior to visit.     Temp 98.8 F (37.1 C) (Oral)    Objective:   Physical Exam  Constitutional: She is oriented to person, place, and time. She appears well-nourished. She does not have a sickly appearance. She does not appear ill.  Respiratory: Effort normal. No respiratory distress.  No cough during exam  Neurological: She is alert and oriented to person, place, and time.  Psychiatric: She has a normal mood and affect.           Assessment & Plan:

## 2019-07-31 DIAGNOSIS — H40023 Open angle with borderline findings, high risk, bilateral: Secondary | ICD-10-CM | POA: Diagnosis not present

## 2019-07-31 DIAGNOSIS — H40053 Ocular hypertension, bilateral: Secondary | ICD-10-CM | POA: Diagnosis not present

## 2019-07-31 DIAGNOSIS — H353121 Nonexudative age-related macular degeneration, left eye, early dry stage: Secondary | ICD-10-CM | POA: Diagnosis not present

## 2019-07-31 DIAGNOSIS — H524 Presbyopia: Secondary | ICD-10-CM | POA: Diagnosis not present

## 2019-07-31 DIAGNOSIS — H278 Other specified disorders of lens: Secondary | ICD-10-CM | POA: Diagnosis not present

## 2019-07-31 DIAGNOSIS — H2513 Age-related nuclear cataract, bilateral: Secondary | ICD-10-CM | POA: Diagnosis not present

## 2019-07-31 DIAGNOSIS — E119 Type 2 diabetes mellitus without complications: Secondary | ICD-10-CM | POA: Diagnosis not present

## 2019-07-31 LAB — HM DIABETES EYE EXAM

## 2019-08-05 ENCOUNTER — Other Ambulatory Visit: Payer: Self-pay

## 2019-08-05 ENCOUNTER — Ambulatory Visit (INDEPENDENT_AMBULATORY_CARE_PROVIDER_SITE_OTHER)
Admission: RE | Admit: 2019-08-05 | Discharge: 2019-08-05 | Disposition: A | Discharge status: Home / Self Care | Payer: Medicare HMO | Source: Ambulatory Visit | Attending: Primary Care | Admitting: Primary Care

## 2019-08-05 ENCOUNTER — Ambulatory Visit (INDEPENDENT_AMBULATORY_CARE_PROVIDER_SITE_OTHER): Payer: Medicare HMO | Admitting: Primary Care

## 2019-08-05 ENCOUNTER — Other Ambulatory Visit: Payer: Self-pay | Admitting: Primary Care

## 2019-08-05 VITALS — BP 126/74 | HR 76 | Temp 96.3°F | Ht 62.5 in | Wt 241.0 lb

## 2019-08-05 DIAGNOSIS — Z23 Encounter for immunization: Secondary | ICD-10-CM | POA: Diagnosis not present

## 2019-08-05 DIAGNOSIS — R05 Cough: Secondary | ICD-10-CM | POA: Diagnosis not present

## 2019-08-05 DIAGNOSIS — N939 Abnormal uterine and vaginal bleeding, unspecified: Secondary | ICD-10-CM

## 2019-08-05 DIAGNOSIS — R600 Localized edema: Secondary | ICD-10-CM

## 2019-08-05 DIAGNOSIS — J189 Pneumonia, unspecified organism: Secondary | ICD-10-CM

## 2019-08-05 DIAGNOSIS — R0600 Dyspnea, unspecified: Secondary | ICD-10-CM | POA: Diagnosis not present

## 2019-08-05 DIAGNOSIS — E1142 Type 2 diabetes mellitus with diabetic polyneuropathy: Secondary | ICD-10-CM | POA: Diagnosis not present

## 2019-08-05 DIAGNOSIS — R0609 Other forms of dyspnea: Secondary | ICD-10-CM | POA: Insufficient documentation

## 2019-08-05 LAB — BASIC METABOLIC PANEL
BUN: 13 mg/dL (ref 6–23)
CO2: 30 mEq/L (ref 19–32)
Calcium: 9.8 mg/dL (ref 8.4–10.5)
Chloride: 105 mEq/L (ref 96–112)
Creatinine, Ser: 0.82 mg/dL (ref 0.40–1.20)
GFR: 67.93 mL/min (ref >60.00)
Glucose, Bld: 109 mg/dL — ABNORMAL HIGH (ref 70–99)
Potassium: 3.8 mEq/L (ref 3.5–5.1)
Sodium: 141 mEq/L (ref 135–145)

## 2019-08-05 LAB — POCT GLYCOSYLATED HEMOGLOBIN (HGB A1C): Hemoglobin A1C: 5.5 % (ref 4.0–5.6)

## 2019-08-05 LAB — BRAIN NATRIURETIC PEPTIDE: Pro B Natriuretic peptide (BNP): 202 pg/mL — ABNORMAL HIGH (ref 0.0–100.0)

## 2019-08-05 MED ORDER — AMOXICILLIN-POT CLAVULANATE 875-125 MG PO TABS: 1.0000 | ORAL_TABLET | Freq: Two times a day (BID) | ORAL | 0 refills | Status: DC

## 2019-08-05 NOTE — Assessment & Plan Note (Addendum)
Briefly discussed with patient. Unchanged.  She continues to decline pelvic/vaginal ultrasound and/or GYN evaluation despite recommendations.  She will continue to monitor and update if any changes.   Agree with plan, Pleas Koch, NP

## 2019-08-05 NOTE — Patient Instructions (Addendum)
Stop taking your Metformin.    It's important to improve your diet by reducing consumption of fast food, fried food, processed snack foods, sugary drinks. Increase consumption of fresh vegetables and fruits, whole grains, water.  Ensure you are drinking 64 ounces of water daily.  Start exercising. You should be walking 5-10 minutes a day, increase by a minute a week as tolerated.   Stop by the lab and x-ray prior to leaving today. I will notify you of your results once received.   Follow-up in 6 months for general follow-up.  It was a pleasure to see you today!    Diabetes Mellitus and Foot Care Foot care is an important part of your health, especially when you have diabetes. Diabetes may cause you to have problems because of poor blood flow (circulation) to your feet and legs, which can cause your skin to:  Become thinner and drier.  Break more easily.  Heal more slowly.  Peel and crack. You may also have nerve damage (neuropathy) in your legs and feet, causing decreased feeling in them. This means that you may not notice minor injuries to your feet that could lead to more serious problems. Noticing and addressing any potential problems early is the best way to prevent future foot problems. How to care for your feet Foot hygiene  Wash your feet daily with warm water and mild soap. Do not use hot water. Then, pat your feet and the areas between your toes until they are completely dry. Do not soak your feet as this can dry your skin.  Trim your toenails straight across. Do not dig under them or around the cuticle. File the edges of your nails with an emery board or nail file.  Apply a moisturizing lotion or petroleum jelly to the skin on your feet and to dry, brittle toenails. Use lotion that does not contain alcohol and is unscented. Do not apply lotion between your toes. Shoes and socks  Wear clean socks or stockings every day. Make sure they are not too tight. Do not wear  knee-high stockings since they may decrease blood flow to your legs.  Wear shoes that fit properly and have enough cushioning. Always look in your shoes before you put them on to be sure there are no objects inside.  To break in new shoes, wear them for just a few hours a day. This prevents injuries on your feet. Wounds, scrapes, corns, and calluses  Check your feet daily for blisters, cuts, bruises, sores, and redness. If you cannot see the bottom of your feet, use a mirror or ask someone for help.  Do not cut corns or calluses or try to remove them with medicine.  If you find a minor scrape, cut, or break in the skin on your feet, keep it and the skin around it clean and dry. You may clean these areas with mild soap and water. Do not clean the area with peroxide, alcohol, or iodine.  If you have a wound, scrape, corn, or callus on your foot, look at it several times a day to make sure it is healing and not infected. Check for: ? Redness, swelling, or pain. ? Fluid or blood. ? Warmth. ? Pus or a bad smell. General instructions  Do not cross your legs. This may decrease blood flow to your feet.  Do not use heating pads or hot water bottles on your feet. They may burn your skin. If you have lost feeling in your feet or  legs, you may not know this is happening until it is too late.  Protect your feet from hot and cold by wearing shoes, such as at the beach or on hot pavement.  Schedule a complete foot exam at least once a year (annually) or more often if you have foot problems. If you have foot problems, report any cuts, sores, or bruises to your health care provider immediately. Contact a health care provider if:  You have a medical condition that increases your risk of infection and you have any cuts, sores, or bruises on your feet.  You have an injury that is not healing.  You have redness on your legs or feet.  You feel burning or tingling in your legs or feet.  You have pain  or cramps in your legs and feet.  Your legs or feet are numb.  Your feet always feel cold.  You have pain around a toenail. Get help right away if:  You have a wound, scrape, corn, or callus on your foot and: ? You have pain, swelling, or redness that gets worse. ? You have fluid or blood coming from the wound, scrape, corn, or callus. ? Your wound, scrape, corn, or callus feels warm to the touch. ? You have pus or a bad smell coming from the wound, scrape, corn, or callus. ? You have a fever. ? You have a red line going up your leg. Summary  Check your feet every day for cuts, sores, red spots, swelling, and blisters.  Moisturize feet and legs daily.  Wear shoes that fit properly and have enough cushioning.  If you have foot problems, report any cuts, sores, or bruises to your health care provider immediately.  Schedule a complete foot exam at least once a year (annually) or more often if you have foot problems. This information is not intended to replace advice given to you by your health care provider. Make sure you discuss any questions you have with your health care provider. Document Released: 08/10/2000 Document Revised: 09/25/2017 Document Reviewed: 09/14/2016 Elsevier Patient Education  2020 [employer].

## 2019-08-05 NOTE — Assessment & Plan Note (Addendum)
Not following with podiatry, she declines.   Foot exam completed today.   Discussed protective footwear.   Agree with plan, Pleas Koch, NP

## 2019-08-05 NOTE — Assessment & Plan Note (Addendum)
Chronic, slightly improved with dose increase of furosemide. Continue same.   Concerning given increased dyspnea on exertion, chest tightness.  BMP, BNP, EKG and chest x-ray pending.    Encouraged activity and elevation of lower extremeties. She is sedentary during the day.  Pleas Koch, NP

## 2019-08-05 NOTE — Progress Notes (Signed)
Subjective:    Patient ID: [name], female    DOB: Feb 11, 1946, 73 y.o.   MRN: [id number]  HPI  [name] is a 73 year old female with a history of hypertension, diabetes, chronic lower extremity edema, morbid obesity who presents today for follow up of diabetes and several complaints.  1) Type 2 Diabetes:   Current medications include: Metformin ER 500 mg daily.  She is checking her blood glucose 0 times daily.  Last A1C: 5.5 today, 5.7 in Summer 2020 Last Eye Exam: Completed Last Foot Exam: Completed today Pneumonia Vaccination: Completed in 2016 ACE/ARB: Ramipril  Statin: None. LDL of 51  2) Shortness of Breath: Chronic for years, worse over the last several months. Dyspnea with exertion mostly that occurs with ADL's including making her bed and getting ready for the day. She does have chest tightness with rest and exertion. Denies abdominal pain, nausea, increased fatigue, extremity pain.   She is mostly sedentary during the day, little activity, doesn't leave her home. She is compliant to her furosemide and is taking 40 mg daily.  BP Readings from Last 3 Encounters:  08/05/19 126/74  03/24/19 126/76  02/24/19 126/78     Review of Systems  Respiratory: Positive for chest tightness and shortness of breath. Negative for cough.   Cardiovascular:       Chronic lower extremity edema, no worse  Neurological: Negative for dizziness and headaches.       Past Medical History:  Diagnosis Date  . Acute meniscal tear of left knee 01/19/2013  . Blood transfusion without reported diagnosis   . Depression   . Diabetes mellitus    borderline  . DJD (degenerative joint disease)   . GERD (gastroesophageal reflux disease)   . Hyperglycemia   . Hypertension   . Peripheral neuropathy   . Restless leg   . Syncope and collapse      Social History   Socioeconomic History  . Marital status: Married    Spouse name: Not on file  . Number of children: 2  . Years of  education: Not on file  . Highest education level: Not on file  Occupational History  . Occupation: retired  Engineer, production  . Financial resource strain: Not on file  . Food insecurity    Worry: Not on file    Inability: Not on file  . Transportation needs    Medical: Not on file    Non-medical: Not on file  Tobacco Use  . Smoking status: Former Smoker    Packs/day: 1.00    Years: 20.00    Pack years: 20.00    Types: Cigarettes  . Smokeless tobacco: Never Used  Substance and Sexual Activity  . Alcohol use: Yes    Comment: ocassionally 1 glass of wine per mth  . Drug use: No  . Sexual activity: Never  Lifestyle  . Physical activity    Days per week: Not on file    Minutes per session: Not on file  . Stress: Not on file  Relationships  . Social Musician on phone: Not on file    Gets together: Not on file    Attends religious service: Not on file    Active member of club or organization: Not on file    Attends meetings of clubs or organizations: Not on file    Relationship status: Not on file  . Intimate partner violence    Fear of current or ex partner:  Not on file    Emotionally abused: Not on file    Physically abused: Not on file    Forced sexual activity: Not on file  Other Topics Concern  . Not on file  Social History Narrative   Patient would desire CPR.   Would not want feeding tubes or heroic measures.   Does not have HPOA.    Past Surgical History:  Procedure Laterality Date  . APPENDECTOMY  1976  . CARDIAC CATHETERIZATION  2006   @ [place]  . 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 KNEE ARTHROSCOPY WITH MEDIAL MENISECTOMY Left 02/11/2013   Procedure: LEFT KNEE ARTHROSCOPY WITH MEDIAL MENISECTOMY;  Surgeon: Joy Bastos, MD;  Location: [facility];  Service: Orthopedics;  Laterality: Left;  Marland Kitchen MULTIPLE TOOTH EXTRACTIONS    . OMENTECTOMY  07/21/2012   Procedure: OMENTECTOMY;  Surgeon: Joy Hollingshead, MD;  Location: [facility];   Service: General;  Laterality: N/A;  . right knee replacement  2008  . TONSILLECTOMY  childhood  . TUBAL LIGATION  1976  . UMBILICAL HERNIA REPAIR  07/21/2012   Procedure: HERNIA REPAIR UMBILICAL ADULT;  Surgeon: Joy Hollingshead, MD;  Location: [facility];  Service: General;  Laterality: N/A;    Family History  Problem Relation Age of Onset  . Parkinsonism Mother   . Hypertension Father   . Heart attack Father     No Known Allergies  Current Outpatient Medications on File Prior to Visit  Medication Sig Dispense Refill  . aspirin 81 MG tablet Take 81 mg by mouth daily.    . Calcium Carb-Cholecalciferol (CALCIUM 500+D PO) Take by mouth.    . esomeprazole (NEXIUM) 20 MG capsule Take 20 mg by mouth every other day.    . furosemide (LASIX) 20 MG tablet Take 2 tablets (40 mg total) by mouth daily. For leg swelling. 180 tablet 1  . metFORMIN (GLUCOPHAGE-XR) 500 MG 24 hr tablet TAKE 1 TABLET BY MOUTH EVERY DAY WITH BREAKFAST 90 tablet 1  . metoprolol succinate (TOPROL-XL) 50 MG 24 hr tablet TAKE 1 TABLET BY MOUTH DAILY WITH OR IMMEDIATELY FOLLOWING A MEAL. 90 tablet 1  . ramipril (ALTACE) 5 MG capsule TAKE 1 CAPSULE (5 MG TOTAL) BY MOUTH DAILY FOR BLOOD PRESSURE. 90 capsule 1  . vitamin B-12 (CYANOCOBALAMIN) 1000 MCG tablet Take 2,000 mcg by mouth daily.      No current facility-administered medications on file prior to visit.     BP 126/74   Pulse 76   Temp (!) 96.3 F (35.7 C) (Temporal)   Ht 5' 2.5" (1.588 m)   Wt 241 lb (109.3 kg)   SpO2 97%   BMI 43.38 kg/m    Objective:   Physical Exam  Constitutional: She is oriented to person, place, and time. She appears well-nourished.  Neck: Neck supple.  Cardiovascular: Normal rate and regular rhythm.  Chronic lower extremity edema. 1+ pitting bilaterally   Respiratory: Effort normal and breath sounds normal.  Neurological: She is alert and oriented to person, place, and time.  Skin: Skin is warm and dry.  Psychiatric: She has a  normal mood and affect.           Assessment & Plan:

## 2019-08-05 NOTE — Assessment & Plan Note (Addendum)
Compliant to Metformin. Repeat A1C 5.5 today.   Given recent A1C readings well below goal, will discontinue Metformin.   Not exercising, deconditioned. Encouraged healthy diet and exercise.   Managed on ACE, not on statin, LDL 51 01/2019.  Influenza vaccine today. Pneumonia UTD.  Foot exam completed today, eye exam UTD.   Follow-up in 6 months.   Agree with plan, Pleas Koch, NP

## 2019-08-05 NOTE — Addendum Note (Signed)
Addended by: Jacqualin Combes on: 08/05/2019 03:38 PM   Modules accepted: Orders

## 2019-08-05 NOTE — Progress Notes (Signed)
   Subjective:    Patient ID: [name], female    DOB: 1946-05-26, 73 y.o.   MRN: [id number]  HPI [name] is a 73 year old female with a history of hypertension, GERD, obesity, type 2 diabetes, peripheral neuropathy and lower extremity edema, who presents today for a follow-up of her diabetes.   1) Type 2 diabetes: Current medications include: Metformin 500 mg XR tablet daily.  She is not checking her blood sugar at home.She does reports symptoms of peripheral neuropathy, including tingling and frequent stabbing, shooting pains to bilateral feet, worse at night.   Last A1C: 02/03/2019, 5.7 Last Eye Exam: Completed 07/31/19 Last Foot Exam: Due today Influenza vaccination: To be completed today Pneumonia Vaccination: Completed 2013 and 2016 ACE/ARB: Currently taking Ramipril 5 mg daily Statin: Not on statin, LDL 51 on 02/03/2019  Diet: She endorses a healthy diet, mostly home cooked meals, some frozen dinners. Desserts daily. Drinking water, sugar free tea, juice and diet soda.   Exercise: She is not exercising.   2) Shortness of breath: Chronic, however, she complains of feeling increasingly "short winded" with minimal activity, over the past two months. Occurs with activity and when wearing a mask. History of tobacco use, sedentary lifestyle. Chronic pitting LE edema, improved since last visit. Adherent to daily furosemide. Reports some chest tightness with exertional dyspnea. Denies wheezing, cough, chest pain, arm pain, back pain.     Wt Readings from Last 3 Encounters:  08/05/19 241 lb (109.3 kg)  03/24/19 242 lb 8 oz (110 kg)  02/03/19 240 lb (108.9 kg)    BP Readings from Last 3 Encounters:  08/05/19 126/74  03/24/19 126/76  02/24/19 126/78     Review of Systems  Constitutional: Positive for fatigue. Negative for appetite change and unexpected weight change.  Eyes: Negative for visual disturbance.  Respiratory: Positive for chest tightness (with exertion) and  shortness of breath (with activity and mask use). Negative for cough and wheezing.   Cardiovascular: Positive for leg swelling. Negative for chest pain.  Gastrointestinal: Negative for constipation and diarrhea.  Endocrine: Positive for polyuria (taking furosemide 40 mg daily for LEE). Negative for polydipsia and polyphagia.  Neurological: Negative for dizziness.  Psychiatric/Behavioral: The patient is not nervous/anxious.        Objective:   Physical Exam Constitutional:      General: She is not in acute distress. Cardiovascular:     Rate and Rhythm: Normal rate and regular rhythm.     Heart sounds: Normal heart sounds.  Pulmonary:     Breath sounds: Normal breath sounds.     Comments: Mildly increased effort, dyspnea on exertion Musculoskeletal:     Right lower leg: Edema (1+ pitting) present.     Left lower leg: Edema (1+ pitting) present.  Neurological:     Mental Status: She is alert and oriented to person, place, and time.           Assessment & Plan:

## 2019-08-05 NOTE — Assessment & Plan Note (Addendum)
Chronic, worsening, limited activity tolerance. Echo in 01/2019 showed mild increase in LV thickness, no evidence of CHF.   May be due to deconditioning, however, with worsening dyspnea, chest tightness, and LEE, will rule out cardiac source.   EKG today with BBB, sinus rhythm with HR of 65, , appears unchanged from previous ECG from 2019. No acute ST changes.  BNP, BMP pending.   C-xray pending.   Pending test results, consider referral to cardiology for further evaluation.  Agree with plan, Pleas Koch, NP

## 2019-08-06 ENCOUNTER — Encounter: Payer: Self-pay | Admitting: Primary Care

## 2019-09-02 ENCOUNTER — Telehealth: Payer: Self-pay | Admitting: Primary Care

## 2019-09-02 DIAGNOSIS — R0609 Other forms of dyspnea: Secondary | ICD-10-CM

## 2019-09-02 DIAGNOSIS — R0600 Dyspnea, unspecified: Secondary | ICD-10-CM

## 2019-09-02 NOTE — Telephone Encounter (Addendum)
-----   Message from Doreene Nest, NP sent at 08/05/2019  5:09 PM EST ----- Regarding: Chest xray, repeat Please notify patient that she needs repeat chest xray since treatment for presumed CAP. Please have her come in.

## 2019-09-02 NOTE — Telephone Encounter (Signed)
Patient stated that she will come by later this week

## 2019-09-03 ENCOUNTER — Ambulatory Visit (INDEPENDENT_AMBULATORY_CARE_PROVIDER_SITE_OTHER)
Admission: RE | Admit: 2019-09-03 | Discharge: 2019-09-03 | Disposition: A | Discharge status: Home / Self Care | Payer: Medicare HMO | Source: Ambulatory Visit | Attending: Primary Care | Admitting: Primary Care

## 2019-09-03 DIAGNOSIS — R0609 Other forms of dyspnea: Secondary | ICD-10-CM

## 2019-09-03 DIAGNOSIS — R0600 Dyspnea, unspecified: Secondary | ICD-10-CM

## 2019-09-03 DIAGNOSIS — R918 Other nonspecific abnormal finding of lung field: Secondary | ICD-10-CM | POA: Diagnosis not present

## 2019-09-18 ENCOUNTER — Other Ambulatory Visit: Payer: Self-pay | Admitting: Primary Care

## 2019-09-18 DIAGNOSIS — R600 Localized edema: Secondary | ICD-10-CM

## 2019-09-18 NOTE — Telephone Encounter (Signed)
Last prescribed on 03/24/2019. Last appointment on 08/05/2019. Next future appointment on 02/12/2020

## 2019-09-18 NOTE — Telephone Encounter (Signed)
Noted, refill sent to pharmacy. 

## 2019-09-25 ENCOUNTER — Other Ambulatory Visit: Payer: Self-pay | Admitting: Primary Care

## 2019-09-25 DIAGNOSIS — I10 Essential (primary) hypertension: Secondary | ICD-10-CM

## 2019-10-19 ENCOUNTER — Telehealth: Payer: Self-pay

## 2019-10-19 NOTE — Telephone Encounter (Signed)
Pt got form from [employer] wanting to know if pt had colonoscopy and flu shot in 2020. Advised pt High Dose 65 + flu shot given on 08/05/19 and do not see listing for colonoscopy but pt did have cologuard 01/01/2017. Pt voiced understanding and nothing further needed.

## 2019-11-07 ENCOUNTER — Other Ambulatory Visit: Payer: Self-pay | Admitting: Primary Care

## 2019-11-07 DIAGNOSIS — I10 Essential (primary) hypertension: Secondary | ICD-10-CM

## 2020-01-11 ENCOUNTER — Telehealth: Payer: Self-pay

## 2020-01-11 ENCOUNTER — Emergency Department
Admission: EM | Admit: 2020-01-11 | Discharge: 2020-01-11 | Disposition: A | Discharge status: Home / Self Care | Payer: Medicare HMO | Attending: Emergency Medicine | Admitting: Emergency Medicine

## 2020-01-11 ENCOUNTER — Other Ambulatory Visit: Payer: Self-pay

## 2020-01-11 ENCOUNTER — Emergency Department: Payer: Medicare HMO

## 2020-01-11 ENCOUNTER — Encounter: Payer: Self-pay | Admitting: Emergency Medicine

## 2020-01-11 DIAGNOSIS — N211 Calculus in urethra: Secondary | ICD-10-CM | POA: Diagnosis not present

## 2020-01-11 DIAGNOSIS — N200 Calculus of kidney: Secondary | ICD-10-CM | POA: Diagnosis not present

## 2020-01-11 DIAGNOSIS — Z96651 Presence of right artificial knee joint: Secondary | ICD-10-CM | POA: Insufficient documentation

## 2020-01-11 DIAGNOSIS — Z79899 Other long term (current) drug therapy: Secondary | ICD-10-CM | POA: Diagnosis not present

## 2020-01-11 DIAGNOSIS — Z87891 Personal history of nicotine dependence: Secondary | ICD-10-CM | POA: Insufficient documentation

## 2020-01-11 DIAGNOSIS — R109 Unspecified abdominal pain: Secondary | ICD-10-CM | POA: Diagnosis present

## 2020-01-11 DIAGNOSIS — Z7984 Long term (current) use of oral hypoglycemic drugs: Secondary | ICD-10-CM | POA: Diagnosis not present

## 2020-01-11 DIAGNOSIS — E119 Type 2 diabetes mellitus without complications: Secondary | ICD-10-CM | POA: Insufficient documentation

## 2020-01-11 LAB — URINALYSIS, COMPLETE (UACMP) WITH MICROSCOPIC
Bilirubin Urine: NEGATIVE
Glucose, UA: NEGATIVE mg/dL
Ketones, ur: NEGATIVE mg/dL
Nitrite: NEGATIVE
Protein, ur: 30 mg/dL — AB
RBC / HPF: >50 RBC/hpf — ABNORMAL HIGH (ref 0–5)
Specific Gravity, Urine: 1.02 (ref 1.005–1.030)
pH: 5 (ref 5.0–8.0)

## 2020-01-11 LAB — CBC
HCT: 43.2 % (ref 36.0–46.0)
Hemoglobin: 14.4 g/dL (ref 12.0–15.0)
MCH: 29.5 pg (ref 26.0–34.0)
MCHC: 33.3 g/dL (ref 30.0–36.0)
MCV: 88.5 fL (ref 80.0–100.0)
Platelets: 211 10*3/uL (ref 150–400)
RBC: 4.88 MIL/uL (ref 3.87–5.11)
RDW: 13.1 % (ref 11.5–15.5)
WBC: 8.1 10*3/uL (ref 4.0–10.5)
nRBC: 0 % (ref 0.0–0.2)

## 2020-01-11 LAB — BASIC METABOLIC PANEL
Anion gap: 8 (ref 5–15)
BUN: 17 mg/dL (ref 8–23)
CO2: 25 mmol/L (ref 22–32)
Calcium: 9.6 mg/dL (ref 8.9–10.3)
Chloride: 108 mmol/L (ref 98–111)
Creatinine, Ser: 0.82 mg/dL (ref 0.44–1.00)
GFR calc Af Amer: >60 mL/min (ref >60)
GFR calc non Af Amer: >60 mL/min (ref >60)
Glucose, Bld: 111 mg/dL — ABNORMAL HIGH (ref 70–99)
Potassium: 4 mmol/L (ref 3.5–5.1)
Sodium: 141 mmol/L (ref 135–145)

## 2020-01-11 MED ORDER — LIDOCAINE HCL URETHRAL/MUCOSAL 2 % EX GEL
1.0000 "application " | Freq: Once | CUTANEOUS | Status: AC
  Administered 2020-01-11: 1 via URETHRAL
  Filled 2020-01-11: qty 5

## 2020-01-11 MED ORDER — LIDOCAINE HCL (CARDIAC) PF 100 MG/5ML IV SOSY
PREFILLED_SYRINGE | INTRAVENOUS | Status: AC
  Filled 2020-01-11: qty 5

## 2020-01-11 NOTE — Telephone Encounter (Signed)
Noted  

## 2020-01-11 NOTE — ED Provider Notes (Signed)
[facility] Emergency Department Provider Note  ____________________________________________  Time seen: Approximately 9:46 PM  I have reviewed the triage vital signs and the nursing notes.   HISTORY  Chief Complaint Flank Pain and Hematuria    HPI [name] is a 74 y.o. female who presents the emergency department for evaluation of flank pain, hematuria and now "something sharp" that she is able to feel while wiping after urinating.  Patient states that she had some flank pain that has radiated into the pelvic region.  This symptom has resolved at this time.  She has had some mild hematuria as well.  She was concerned today as she has had something sharp that she can palpate while wiping after urinating.  She states that this will also "tear the toilet paper" when she wipes.  Patient with a history of anemia, diabetes, GERD, hypertension.  No complaints of chronic medical problems.  Patient does have a history of nephrolithiasis as well.         Past Medical History:  Diagnosis Date  . Acute meniscal tear of left knee 01/19/2013  . Blood transfusion without reported diagnosis   . Depression   . Diabetes mellitus    borderline  . DJD (degenerative joint disease)   . GERD (gastroesophageal reflux disease)   . Hyperglycemia   . Hypertension   . Peripheral neuropathy   . Restless leg   . Syncope and collapse     Patient Active Problem List   Diagnosis Date Noted  . Dyspnea on exertion 08/05/2019  . Vaginal bleeding 02/03/2019  . Lower extremity edema 02/03/2019  . Preventative health care 01/23/2018  . Kidney stones 07/08/2017  . Diabetes with neurologic complications ([place]) 08/65/7846  . Depression   . Hypertension   . GERD (gastroesophageal reflux disease)   . DJD (degenerative joint disease)   . Restless leg   . Peripheral neuropathy     Past Surgical History:  Procedure Laterality Date  . APPENDECTOMY  1976  . CARDIAC  CATHETERIZATION  2006   @ [place]  . 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 KNEE ARTHROSCOPY WITH MEDIAL MENISECTOMY Left 02/11/2013   Procedure: LEFT KNEE ARTHROSCOPY WITH MEDIAL MENISECTOMY;  Surgeon: Tobi Bastos, MD;  Location: [facility];  Service: Orthopedics;  Laterality: Left;  Marland Kitchen MULTIPLE TOOTH EXTRACTIONS    . OMENTECTOMY  07/21/2012   Procedure: OMENTECTOMY;  Surgeon: Odis Hollingshead, MD;  Location: [facility];  Service: General;  Laterality: N/A;  . right knee replacement  2008  . TONSILLECTOMY  childhood  . TUBAL LIGATION  1976  . UMBILICAL HERNIA REPAIR  07/21/2012   Procedure: HERNIA REPAIR UMBILICAL ADULT;  Surgeon: Odis Hollingshead, MD;  Location: [facility];  Service: General;  Laterality: N/A;    Prior to Admission medications   Medication Sig Start Date End Date Taking? Authorizing Provider  amoxicillin-clavulanate (AUGMENTIN) 875-125 MG tablet Take 1 tablet by mouth 2 (two) times daily. 08/05/19   Pleas Koch, NP  aspirin 81 MG tablet Take 81 mg by mouth daily.    [provider]  Calcium Carb-Cholecalciferol (CALCIUM 500+D PO) Take by mouth.    [provider]  esomeprazole (NEXIUM) 20 MG capsule Take 20 mg by mouth every other day.    [provider]  furosemide (LASIX) 20 MG tablet TAKE 2 TABLETS (40 MG TOTAL) BY MOUTH DAILY. FOR LEG SWELLING. 09/18/19   Pleas Koch, NP  metFORMIN (  GLUCOPHAGE-XR) 500 MG 24 hr tablet TAKE 1 TABLET BY MOUTH EVERY DAY WITH BREAKFAST 04/06/19   Doreene Nest, NP  metoprolol succinate (TOPROL-XL) 50 MG 24 hr tablet TAKE 1 TABLET BY MOUTH DAILY WITH OR IMMEDIATELY FOLLOWING A MEAL. 09/25/19   Doreene Nest, NP  ramipril (ALTACE) 5 MG capsule TAKE 1 CAPSULE (5 MG TOTAL) BY MOUTH DAILY FOR BLOOD PRESSURE. 11/09/19   Doreene Nest, NP  vitamin B-12 (CYANOCOBALAMIN) 1000 MCG tablet Take 2,000 mcg by mouth daily.     [provider]    Allergies Patient has no known  allergies.  Family History  Problem Relation Age of Onset  . Parkinsonism Mother   . Hypertension Father   . Heart attack Father     Social History Social History   Tobacco Use  . Smoking status: Former Smoker    Packs/day: 1.00    Years: 20.00    Pack years: 20.00    Types: Cigarettes  . Smokeless tobacco: Never Used  Substance Use Topics  . Alcohol use: Yes    Comment: ocassionally 1 glass of wine per mth  . Drug use: No     Review of Systems  Constitutional: No fever/chills Eyes: No visual changes. No discharge ENT: No upper respiratory complaints. Cardiovascular: no chest pain. Respiratory: no cough. No SOB. Gastrointestinal: No abdominal pain.  No nausea, no vomiting.  No diarrhea.  No constipation. Genitourinary: Negative for dysuria.  Positive for transient flank pain that is now resolved as well as hematuria.  Positive for something "sharp" while wiping  Musculoskeletal: Negative for musculoskeletal pain. Skin: Negative for rash, abrasions, lacerations, ecchymosis. Neurological: Negative for headaches, focal weakness or numbness. 10-point ROS otherwise negative.  ____________________________________________   PHYSICAL EXAM:  VITAL SIGNS: ED Triage Vitals  Enc Vitals Group     BP 01/11/20 1655 (!) 172/95     Pulse Rate 01/11/20 1655 84     Resp 01/11/20 1655 18     Temp 01/11/20 1655 98.3 F (36.8 C)     Temp Source 01/11/20 1655 Oral     SpO2 01/11/20 1655 98 %     Weight 01/11/20 1656 240 lb (108.9 kg)     Height 01/11/20 1656 5\' 3"  (1.6 m)     Head Circumference --      Peak Flow --      Pain Score 01/11/20 1656 5     Pain Loc --      Pain Edu? --      Excl. in GC? --      Constitutional: Alert and oriented. Well appearing and in no acute distress. Eyes: Conjunctivae are normal. PERRL. EOMI. Head: Atraumatic. ENT:      Ears:       Nose: No congestion/rhinnorhea.      Mouth/Throat: Mucous membranes are moist.  Neck: No stridor.     Cardiovascular: Normal rate, regular rhythm. Normal S1 and S2.  Good peripheral circulation. Respiratory: Normal respiratory effort without tachypnea or retractions. Lungs CTAB. Good air entry to the bases with no decreased or absent breath sounds. Gastrointestinal: Bowel sounds 4 quadrants. Soft and nontender to palpation. No guarding or rigidity. No palpable masses. No distention. No CVA tenderness. Genitourinary: Visualization of the perineal region reveals possible foreign body at the opening of the urethra. Musculoskeletal: Full range of motion to all extremities. No gross deformities appreciated. Neurologic:  Normal speech and language. No gross focal neurologic deficits are appreciated.  Skin:  Skin is warm,  dry and intact. No rash noted. Psychiatric: Mood and affect are normal. Speech and behavior are normal. Patient exhibits appropriate insight and judgement.   ____________________________________________   LABS (all labs ordered are listed, but only abnormal results are displayed)  Labs Reviewed  URINALYSIS, COMPLETE (UACMP) WITH MICROSCOPIC - Abnormal; Notable for the following components:      Result Value   Color, Urine YELLOW (*)    APPearance CLEAR (*)    Hgb urine dipstick MODERATE (*)    Protein, ur 30 (*)    Leukocytes,Ua SMALL (*)    RBC / HPF >50 (*)    Bacteria, UA RARE (*)    All other components within normal limits  BASIC METABOLIC PANEL - Abnormal; Notable for the following components:   Glucose, Bld 111 (*)    All other components within normal limits  CBC   ____________________________________________  EKG   ____________________________________________  RADIOLOGY I personally viewed and evaluated these images as part of my medical decision making, as well as reviewing the written report by the radiologist.  I have visualized the images and concur with radiologist finding of calcification distal to the bladder consistent with urethral stone.  CT  Renal Stone Study  Result Date: 01/11/2020 CLINICAL DATA:  Flank pain EXAM: CT ABDOMEN AND PELVIS WITHOUT CONTRAST TECHNIQUE: Multidetector CT imaging of the abdomen and pelvis was performed following the standard protocol without IV contrast. COMPARISON:  07/05/2017 FINDINGS: Lower chest: Lung bases are clear. No effusions. Heart is normal size. Hepatobiliary: Small gallstone noted within the gallbladder, stable. No focal hepatic abnormality. Pancreas: No focal abnormality or ductal dilatation. Spleen: No focal abnormality.  Normal size. Adrenals/Urinary Tract: Small nodule in the left adrenal gland measures 15 mm, stable. Bilateral renal cysts are stable. [place] right renal stone measures 12 mm, stable. No ureteral stones or hydronephrosis. Inferior to the bladder, there is a midline calcification measuring 9 mm which could reflect a urethral stone. Urinary bladder is decompressed. Stomach/Bowel: Sigmoid diverticulosis. No active diverticulitis. Stomach and small bowel decompressed, unremarkable. Vascular/Lymphatic: Aortic atherosclerosis. No enlarged abdominal or pelvic lymph nodes. Reproductive: Uterus and adnexa unremarkable.  No mass. Other: No free fluid or free air. Musculoskeletal: No acute bony abnormality. IMPRESSION: Right nephrolithiasis.  No hydronephrosis. 9 mm calcification inferior to the bladder may reflect urethral stone. Sigmoid diverticulosis. Cholelithiasis. Aortic atherosclerosis. Electronically Signed   By: Charlett Nose M.D.   On: 01/11/2020 19:21    ____________________________________________    PROCEDURES  Procedure(s) performed:    .Foreign Body Removal  Date/Time: 01/11/2020 11:35 PM Performed by: Racheal Patches, PA-C Authorized by: Racheal Patches, PA-C  Consent: Verbal consent obtained. Risks and benefits: risks, benefits and alternatives were discussed Consent given by: patient Patient understanding: patient states understanding of the procedure  being performed Imaging studies: imaging studies available Patient identity confirmed: verbally with patient Time out: Immediately prior to procedure a "time out" was called to verify the correct patient, procedure, equipment, support staff and site/side marked as required. Intake: perineum/urethral opening.  Sedation: Patient sedated: no  Patient restrained: no Patient cooperative: yes Complexity: simple 1 objects recovered. Objects recovered: Nephrolithiasis Post-procedure assessment: foreign body removed Patient tolerance: patient tolerated the procedure well with no immediate complications Comments: Exam with chaperone reveals foreign body in the urethral opening consistent with nephrolithiasis.  Lidocaine is administered with Urojet.  Using forceps, foreign body is successfully extracted from the urethral opening.  Foreign body is consistent with kidney stone.      Medications  lidocaine (XYLOCAINE) 2 % jelly 1 application (1 application Urethral Given 01/11/20 2227)     ____________________________________________   INITIAL IMPRESSION / ASSESSMENT AND PLAN / ED COURSE  Pertinent labs & imaging results that were available during my care of the patient were reviewed by me and considered in my medical decision making (see chart for details).  Review of the [place] CSRS was performed in accordance of the [facility] prior to dispensing any controlled drugs.           Patient's diagnosis is consistent with nephrolithiasis.  Patient presented to the emergency department with flank pain, hematuria.  Flank pain had progressed from her flank into her suprapubic region and eventually resolved.  Patient has had some hematuria as well.  Patient is now complaining of sharp object in the perineal region that she can feel while wiping after urinating.  CT scan revealed possible urethral stone.  Visualization on exam revealed nonmobile foreign body in the urethral opening consistent with kidney  stone.  Using forceps this is removed.  No complications.  At this time patient requires no further treatment.  No medications on discharge.  Follow-up primary care as needed..  Patient is given ED precautions to return to the ED for any worsening or new symptoms.     ____________________________________________  FINAL CLINICAL IMPRESSION(S) / ED DIAGNOSES  Final diagnoses:  Nephrolithiasis      NEW MEDICATIONS STARTED DURING THIS VISIT:  ED Discharge Orders    None          This chart was dictated using voice recognition software/Dragon. Despite best efforts to proofread, errors can occur which can change the meaning. Any change was purely unintentional.    Racheal Patches, PA-C 01/11/20 2338    Concha Se, MD 01/12/20 [phone]

## 2020-01-11 NOTE — Telephone Encounter (Signed)
Pt said burning and pain upon urination; pt said last night when she wiped she could feel a pointy rock where urine comes out. Pt can urinate but urine is not coming out like normal. Pt could not get a hold on pointy rock to pull it out. Now pain level is one but pt can still feel like something is stuck where the urine comes out. Pt said previously hx of kidney stones was taken care of at ED; has not seen urologist. Pt said when wipes does see pink tinge on tissue. Pt said she feels like she has a rock between her legs and feels swollen in perineal area. No available appts at [facility]; Pt is not sure should wait for appt on 01/12/20. So pt will go to [facility] on [address] and if needed will go to [facility] ED. FYI to Allayne Gitelman NP.

## 2020-01-11 NOTE — ED Triage Notes (Signed)
Pt via pov; sent from urgent care. Pt states she began to have painful urination last night with some hematuria. She states that she felt something "with a sharp edge" that is in her urethra that she was unable to push or pull it out. She states that she has urgency with smaller output since then. Pt states she also has pain in her right flank. Hx of kidney stones. NAD Noted.

## 2020-01-20 ENCOUNTER — Other Ambulatory Visit: Payer: Self-pay | Admitting: Primary Care

## 2020-01-20 DIAGNOSIS — E1140 Type 2 diabetes mellitus with diabetic neuropathy, unspecified: Secondary | ICD-10-CM

## 2020-01-27 ENCOUNTER — Other Ambulatory Visit: Payer: Self-pay | Admitting: Primary Care

## 2020-01-27 DIAGNOSIS — E538 Deficiency of other specified B group vitamins: Secondary | ICD-10-CM

## 2020-01-27 DIAGNOSIS — E1142 Type 2 diabetes mellitus with diabetic polyneuropathy: Secondary | ICD-10-CM

## 2020-02-08 ENCOUNTER — Other Ambulatory Visit (INDEPENDENT_AMBULATORY_CARE_PROVIDER_SITE_OTHER): Payer: Medicare HMO

## 2020-02-08 ENCOUNTER — Ambulatory Visit: Payer: Medicare HMO

## 2020-02-08 ENCOUNTER — Telehealth: Payer: Self-pay

## 2020-02-08 DIAGNOSIS — E1142 Type 2 diabetes mellitus with diabetic polyneuropathy: Secondary | ICD-10-CM

## 2020-02-08 DIAGNOSIS — E538 Deficiency of other specified B group vitamins: Secondary | ICD-10-CM | POA: Diagnosis not present

## 2020-02-08 LAB — HEPATIC FUNCTION PANEL
ALT: 21 U/L (ref 0–35)
AST: 21 U/L (ref 0–37)
Albumin: 4.4 g/dL (ref 3.5–5.2)
Alkaline Phosphatase: 89 U/L (ref 39–117)
Bilirubin, Direct: 0.1 mg/dL (ref 0.0–0.3)
Total Bilirubin: 0.5 mg/dL (ref 0.2–1.2)
Total Protein: 6.8 g/dL (ref 6.0–8.3)

## 2020-02-08 LAB — LIPID PANEL
Cholesterol: 132 mg/dL (ref 0–200)
HDL: 55.3 mg/dL (ref >39.00)
LDL Cholesterol: 49 mg/dL (ref 0–99)
NonHDL: 76.82
Total CHOL/HDL Ratio: 2
Triglycerides: 141 mg/dL (ref 0.0–149.0)
VLDL: 28.2 mg/dL (ref 0.0–40.0)

## 2020-02-08 LAB — VITAMIN B12: Vitamin B-12: 815 pg/mL (ref 211–911)

## 2020-02-08 LAB — HEMOGLOBIN A1C: Hgb A1c MFr Bld: 5.8 % (ref 4.6–6.5)

## 2020-02-08 NOTE — Telephone Encounter (Addendum)
Noted, will evaluate patient later this week as scheduled.

## 2020-02-08 NOTE — Progress Notes (Deleted)
Subjective:   [name] is a 74 y.o. female who presents for Medicare Annual (Subsequent) preventive examination.  Review of Systems: N/A   I connected with the patient today by telephone and verified that I am speaking with the correct person using two identifiers. Location patient: home Location nurse: work Persons participating in the virtual visit: patient, Engineer, civil (consulting).   I discussed the limitations, risks, security and privacy concerns of performing an evaluation and management service by telephone and the availability of in person appointments. I also discussed with the patient that there may be a patient responsible charge related to this service. The patient expressed understanding and verbally consented to this telephonic visit.    Interactive audio and video telecommunications were attempted between this nurse and patient, however failed, due to patient having technical difficulties OR patient did not have access to video capability.  We continued and completed visit with audio only.           Objective:     Vitals: There were no vitals taken for this visit.  There is no height or weight on file to calculate BMI.  Advanced Directives 01/11/2020 01/16/2018 12/26/2015 02/11/2013 02/02/2013 01/18/2013 07/22/2012  Does Patient Have a Medical Advance Directive? No No No - Patient does not have advance directive;Patient would not like information Patient does not have advance directive Patient does not have advance directive;Patient would not like information  Would patient like information on creating a medical advance directive? - Yes (MAU/Ambulatory/Procedural Areas - Information given) Yes - Educational materials given - - - -  Pre-existing out of facility DNR order (yellow form or pink MOST form) - - - No - No No    Tobacco Social History   Tobacco Use  Smoking Status Former Smoker  . Packs/day: 1.00  . Years: 20.00  . Pack years: 20.00  . Types: Cigarettes  Smokeless  Tobacco Never Used     Counseling given: Not Answered   Clinical Intake:                       Past Medical History:  Diagnosis Date  . Acute meniscal tear of left knee 01/19/2013  . Blood transfusion without reported diagnosis   . Depression   . Diabetes mellitus    borderline  . DJD (degenerative joint disease)   . GERD (gastroesophageal reflux disease)   . Hyperglycemia   . Hypertension   . Peripheral neuropathy   . Restless leg   . Syncope and collapse    Past Surgical History:  Procedure Laterality Date  . APPENDECTOMY  1976  . CARDIAC CATHETERIZATION  2006   @ [place]  . 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 KNEE ARTHROSCOPY WITH MEDIAL MENISECTOMY Left 02/11/2013   Procedure: LEFT KNEE ARTHROSCOPY WITH MEDIAL MENISECTOMY;  Surgeon: Jacki Cones, MD;  Location: [facility];  Service: Orthopedics;  Laterality: Left;  Marland Kitchen MULTIPLE TOOTH EXTRACTIONS    . OMENTECTOMY  07/21/2012   Procedure: OMENTECTOMY;  Surgeon: Adolph Pollack, MD;  Location: [facility];  Service: General;  Laterality: N/A;  . right knee replacement  2008  . TONSILLECTOMY  childhood  . TUBAL LIGATION  1976  . UMBILICAL HERNIA REPAIR  07/21/2012   Procedure: HERNIA REPAIR UMBILICAL ADULT;  Surgeon: Adolph Pollack, MD;  Location: [facility];  Service: General;  Laterality: N/A;   Family History  Problem Relation Age of Onset  . Parkinsonism Mother   .  Hypertension Father   . Heart attack Father    Social History   Socioeconomic History  . Marital status: Married    Spouse name: Not on file  . Number of children: 2  . Years of education: Not on file  . Highest education level: Not on file  Occupational History  . Occupation: retired  Tobacco Use  . Smoking status: Former Smoker    Packs/day: 1.00    Years: 20.00    Pack years: 20.00    Types: Cigarettes  . Smokeless tobacco: Never Used  Vaping Use  . Vaping Use: Never used  Substance and Sexual Activity  .  Alcohol use: Yes    Comment: ocassionally 1 glass of wine per mth  . Drug use: No  . Sexual activity: Never  Other Topics Concern  . Not on file  Social History Narrative   Patient would desire CPR.   Would not want feeding tubes or heroic measures.   Does not have HPOA.   Social Determinants of Health   Financial Resource Strain:   . Difficulty of Paying Living Expenses:   Food Insecurity:   . Worried About Charity fundraiser in the Last Year:   . Arboriculturist in the Last Year:   Transportation Needs:   . Film/video editor (Medical):   Marland Kitchen Lack of Transportation (Non-Medical):   Physical Activity:   . Days of Exercise per Week:   . Minutes of Exercise per Session:   Stress:   . Feeling of Stress :   Social Connections:   . Frequency of Communication with Friends and Family:   . Frequency of Social Gatherings with Friends and Family:   . Attends Religious Services:   . Active Member of Clubs or Organizations:   . Attends Archivist Meetings:   Marland Kitchen Marital Status:     Outpatient Encounter Medications as of 02/08/2020  Medication Sig  . amoxicillin-clavulanate (AUGMENTIN) 875-125 MG tablet Take 1 tablet by mouth 2 (two) times daily.  Marland Kitchen aspirin 81 MG tablet Take 81 mg by mouth daily.  . Calcium Carb-Cholecalciferol (CALCIUM 500+D PO) Take by mouth.  . esomeprazole (NEXIUM) 20 MG capsule Take 20 mg by mouth every other day.  . furosemide (LASIX) 20 MG tablet TAKE 2 TABLETS (40 MG TOTAL) BY MOUTH DAILY. FOR LEG SWELLING.  . metFORMIN (GLUCOPHAGE-XR) 500 MG 24 hr tablet TAKE 1 TABLET BY MOUTH EVERY DAY WITH BREAKFAST  . metoprolol succinate (TOPROL-XL) 50 MG 24 hr tablet TAKE 1 TABLET BY MOUTH DAILY WITH OR IMMEDIATELY FOLLOWING A MEAL.  . ramipril (ALTACE) 5 MG capsule TAKE 1 CAPSULE (5 MG TOTAL) BY MOUTH DAILY FOR BLOOD PRESSURE.  . vitamin B-12 (CYANOCOBALAMIN) 1000 MCG tablet Take 2,000 mcg by mouth daily.    No facility-administered encounter medications on  file as of 02/08/2020.    Activities of Daily Living No flowsheet data found.  Patient Care Team: Pleas Koch, NP as PCP - General (Internal Medicine) Latanya Maudlin, MD as Consulting Physician (Orthopedic Surgery) Jackolyn Confer, MD as Consulting Physician (General Surgery)    Assessment:   This is a routine wellness examination for [place].  Exercise Activities and Dietary recommendations    Goals    . Increase physical activity     Starting 01/16/2018, I will attempt to exercise at [facility] for at least 60 min 1-2 days per week.        Fall Risk Fall Risk  01/16/2018 12/27/2016 12/26/2015 09/23/2014  09/23/2014  Falls in the past year? No No No No No   Is the patient's home free of loose throw rugs in walkways, pet beds, electrical cords, etc?   {Blank single:19197::"yes","no"}      Grab bars in the bathroom? {Blank single:19197::"yes","no"}      Handrails on the stairs?   {Blank single:19197::"yes","no"}      Adequate lighting?   {Blank single:19197::"yes","no"}  Timed Get Up and Go performed: N/A  Depression Screen PHQ 2/9 Scores 01/16/2018 12/27/2016 12/26/2015 09/23/2014  PHQ - 2 Score 1 6 6  0  PHQ- 9 Score 4 22 12  -     Cognitive Function MMSE - Mini Mental State Exam 01/16/2018 12/26/2015  Orientation to time 5 5  Orientation to Place 5 5  Registration 3 3  Attention/ Calculation 0 0  Recall 3 3  Language- name 2 objects 0 0  Language- repeat 1 1  Language- follow 3 step command 3 3  Language- read & follow direction 0 0  Write a sentence 0 0  Copy design 0 0  Total score 20 20  Mini Cog  Mini-Cog screen was completed. Maximum score is 22. A value of 0 denotes this part of the MMSE was not completed or the patient failed this part of the Mini-Cog screening.       Immunization History  Administered Date(s) Administered  . Fluad Quad(high Dose 65+) 08/05/2019  . Influenza, High Dose Seasonal PF 07/05/2015  . Influenza,inj,Quad PF,6+ Mos 09/30/2013,  07/08/2017, 07/28/2018  . Pneumococcal Conjugate-13 09/23/2014  . Pneumococcal Polysaccharide-23 04/02/2012  . Zoster 09/30/2013    Qualifies for Shingles Vaccine: yes   Screening Tests Health Maintenance  Topic Date Due  . COVID-19 Vaccine (1) Never done  . TETANUS/TDAP  Never done  . MAMMOGRAM  10/22/2015  . Fecal DNA (Cologuard)  01/08/2020  . HEMOGLOBIN A1C  02/03/2020  . INFLUENZA VACCINE  03/27/2020  . OPHTHALMOLOGY EXAM  07/30/2020  . FOOT EXAM  08/04/2020  . DEXA SCAN  Completed  . Hepatitis C Screening  Completed  . PNA vac Low Risk Adult  Completed    Cancer Screenings: Lung: Low Dose CT Chest recommended if Age 39-80 years, 30 pack-year currently smoking OR have quit w/in 15 years. Patient does not qualify. Breast:  Up to date on Mammogram: {Yes/No:[id number]}   Up to date of Bone Density/Dexa: Yes, completed 02/13/2018 Colorectal: ***  Additional Screenings:  Hepatitis C Screening: 12/26/2015     Plan:   ***   I have personally reviewed and noted the following in the patient's chart:   . Medical and social history . Use of alcohol, tobacco or illicit drugs  . Current medications and supplements . Functional ability and status . Nutritional status . Physical activity . Advanced directives . List of other physicians . Hospitalizations, surgeries, and ER visits in previous 12 months . Vitals . Screenings to include cognitive, depression, and falls . Referrals and appointments  In addition, I have reviewed and discussed with patient certain preventive protocols, quality metrics, and best practice recommendations. A written personalized care plan for preventive services as well as general preventive health recommendations were provided to patient.     02/15/2018, LPN  02/25/2016

## 2020-02-08 NOTE — Telephone Encounter (Signed)
Called patient to complete her Medicare visit and she stated that she was at the doctors office currently checking in for an appointment. Appointment cancelled per patient request.

## 2020-02-12 ENCOUNTER — Ambulatory Visit (INDEPENDENT_AMBULATORY_CARE_PROVIDER_SITE_OTHER): Payer: Medicare HMO | Admitting: Primary Care

## 2020-02-12 ENCOUNTER — Encounter: Payer: Self-pay | Admitting: Primary Care

## 2020-02-12 ENCOUNTER — Other Ambulatory Visit: Payer: Self-pay

## 2020-02-12 VITALS — BP 134/78 | HR 82 | Temp 96.4°F | Ht 63.0 in | Wt 244.8 lb

## 2020-02-12 DIAGNOSIS — R0600 Dyspnea, unspecified: Secondary | ICD-10-CM

## 2020-02-12 DIAGNOSIS — M8949 Other hypertrophic osteoarthropathy, multiple sites: Secondary | ICD-10-CM

## 2020-02-12 DIAGNOSIS — M159 Polyosteoarthritis, unspecified: Secondary | ICD-10-CM

## 2020-02-12 DIAGNOSIS — M8580 Other specified disorders of bone density and structure, unspecified site: Secondary | ICD-10-CM | POA: Diagnosis not present

## 2020-02-12 DIAGNOSIS — E2839 Other primary ovarian failure: Secondary | ICD-10-CM | POA: Diagnosis not present

## 2020-02-12 DIAGNOSIS — Z0000 Encounter for general adult medical examination without abnormal findings: Secondary | ICD-10-CM

## 2020-02-12 DIAGNOSIS — F329 Major depressive disorder, single episode, unspecified: Secondary | ICD-10-CM

## 2020-02-12 DIAGNOSIS — F32A Depression, unspecified: Secondary | ICD-10-CM

## 2020-02-12 DIAGNOSIS — N200 Calculus of kidney: Secondary | ICD-10-CM

## 2020-02-12 DIAGNOSIS — K219 Gastro-esophageal reflux disease without esophagitis: Secondary | ICD-10-CM

## 2020-02-12 DIAGNOSIS — R0609 Other forms of dyspnea: Secondary | ICD-10-CM

## 2020-02-12 DIAGNOSIS — Z8639 Personal history of other endocrine, nutritional and metabolic disease: Secondary | ICD-10-CM

## 2020-02-12 DIAGNOSIS — R600 Localized edema: Secondary | ICD-10-CM

## 2020-02-12 DIAGNOSIS — I10 Essential (primary) hypertension: Secondary | ICD-10-CM | POA: Diagnosis not present

## 2020-02-12 NOTE — Progress Notes (Signed)
Subjective:    Patient ID: [name], female    DOB: 11/01/1945, 74 y.o.   MRN: [id number]  HPI  This visit occurred during the SARS-CoV-2 public health emergency.  Safety protocols were in place, including screening questions prior to the visit, additional usage of staff PPE, and extensive cleaning of exam room while observing appropriate contact time as indicated for disinfecting solutions.   [name] is a 74 year old female who presents today for complete physical.  Immunizations: -Influenza: Completed last season  -Shingles: Completed Zostavax -Pneumonia: Completed Prevnar in 2016, Pneumovax in 2013 -Covid-19: Completed series  Diet: She endorses a healthy diet.  Exercise: She is active at home, not exercising.   Eye exam: Completed in 2020 Dental exam: No recent exam  Mammogram: No recent imaging, declines due to age. Dexa: Completed in 2019, osteopenia  Colonoscopy: Completed Cologuard in 2018, negative. Declines today. Hep C Screen: Negative   BP Readings from Last 3 Encounters:  02/12/20 134/78  01/11/20 (!) 164/71  08/05/19 126/74   Wt Readings from Last 3 Encounters:  02/12/20 244 lb 12 oz (111 kg)  01/11/20 240 lb (108.9 kg)  08/05/19 241 lb (109.3 kg)     Review of Systems  Constitutional: Negative for unexpected weight change.  HENT: Negative for rhinorrhea.   Respiratory: Negative for cough.   Cardiovascular: Negative for chest pain.  Gastrointestinal: Negative for constipation and diarrhea.  Genitourinary: Negative for difficulty urinating and menstrual problem.  Musculoskeletal: Negative for arthralgias.  Skin: Negative for rash.  Allergic/Immunologic: Negative for environmental allergies.  Neurological: Negative for dizziness, numbness and headaches.  Psychiatric/Behavioral: The patient is not nervous/anxious.        Past Medical History:  Diagnosis Date  . Acute meniscal tear of left knee 01/19/2013  . Blood transfusion without  reported diagnosis   . Depression   . Diabetes mellitus    borderline  . DJD (degenerative joint disease)   . GERD (gastroesophageal reflux disease)   . Hyperglycemia   . Hypertension   . Peripheral neuropathy   . Restless leg   . Syncope and collapse      Social History   Socioeconomic History  . Marital status: Married    Spouse name: Not on file  . Number of children: 2  . Years of education: Not on file  . Highest education level: Not on file  Occupational History  . Occupation: retired  Tobacco Use  . Smoking status: Former Smoker    Packs/day: 1.00    Years: 20.00    Pack years: 20.00    Types: Cigarettes  . Smokeless tobacco: Never Used  Vaping Use  . Vaping Use: Never used  Substance and Sexual Activity  . Alcohol use: Yes    Comment: ocassionally 1 glass of wine per mth  . Drug use: No  . Sexual activity: Never  Other Topics Concern  . Not on file  Social History Narrative   Patient would desire CPR.   Would not want feeding tubes or heroic measures.   Does not have HPOA.   Social Determinants of Health   Financial Resource Strain:   . Difficulty of Paying Living Expenses:   Food Insecurity:   . Worried About Charity fundraiser in the Last Year:   . Arboriculturist in the Last Year:   Transportation Needs:   . Film/video editor (Medical):   Marland Kitchen Lack of Transportation (Non-Medical):   Physical Activity:   .  Days of Exercise per Week:   . Minutes of Exercise per Session:   Stress:   . Feeling of Stress :   Social Connections:   . Frequency of Communication with Friends and Family:   . Frequency of Social Gatherings with Friends and Family:   . Attends Religious Services:   . Active Member of Clubs or Organizations:   . Attends Banker Meetings:   Marland Kitchen Marital Status:   Intimate Partner Violence:   . Fear of Current or Ex-Partner:   . Emotionally Abused:   Marland Kitchen Physically Abused:   . Sexually Abused:     Past Surgical History:    Procedure Laterality Date  . APPENDECTOMY  1976  . CARDIAC CATHETERIZATION  2006   @ [place]  . 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 KNEE ARTHROSCOPY WITH MEDIAL MENISECTOMY Left 02/11/2013   Procedure: LEFT KNEE ARTHROSCOPY WITH MEDIAL MENISECTOMY;  Surgeon: Jacki Cones, MD;  Location: [facility];  Service: Orthopedics;  Laterality: Left;  Marland Kitchen MULTIPLE TOOTH EXTRACTIONS    . OMENTECTOMY  07/21/2012   Procedure: OMENTECTOMY;  Surgeon: Adolph Pollack, MD;  Location: [facility];  Service: General;  Laterality: N/A;  . right knee replacement  2008  . TONSILLECTOMY  childhood  . TUBAL LIGATION  1976  . UMBILICAL HERNIA REPAIR  07/21/2012   Procedure: HERNIA REPAIR UMBILICAL ADULT;  Surgeon: Adolph Pollack, MD;  Location: [facility];  Service: General;  Laterality: N/A;    Family History  Problem Relation Age of Onset  . Parkinsonism Mother   . Hypertension Father   . Heart attack Father     No Known Allergies  Current Outpatient Medications on File Prior to Visit  Medication Sig Dispense Refill  . aspirin 81 MG tablet Take 81 mg by mouth daily.    . Calcium Carb-Cholecalciferol (CALCIUM 500+D PO) Take by mouth.    . esomeprazole (NEXIUM) 20 MG capsule Take 20 mg by mouth every other day.    . furosemide (LASIX) 20 MG tablet TAKE 2 TABLETS (40 MG TOTAL) BY MOUTH DAILY. FOR LEG SWELLING. 180 tablet 1  . metoprolol succinate (TOPROL-XL) 50 MG 24 hr tablet TAKE 1 TABLET BY MOUTH DAILY WITH OR IMMEDIATELY FOLLOWING A MEAL. 90 tablet 1  . ramipril (ALTACE) 5 MG capsule TAKE 1 CAPSULE (5 MG TOTAL) BY MOUTH DAILY FOR BLOOD PRESSURE. 90 capsule 1  . vitamin B-12 (CYANOCOBALAMIN) 1000 MCG tablet Take 2,000 mcg by mouth daily.      No current facility-administered medications on file prior to visit.    BP 134/78   Pulse 82   Temp (!) 96.4 F (35.8 C) (Temporal)   Ht 5\' 3"  (1.6 m)   Wt 244 lb 12 oz (111 kg)   SpO2 97%   BMI 43.36 kg/m    Objective:   Physical Exam   Constitutional: She is oriented to person, place, and time.  HENT:  Right Ear: Tympanic membrane and ear canal normal.  Left Ear: Tympanic membrane and ear canal normal.  Eyes: Pupils are equal, round, and reactive to light.  Cardiovascular: Normal rate and regular rhythm.  Respiratory: Effort normal and breath sounds normal.  GI: Soft. Bowel sounds are normal. There is no abdominal tenderness.  Musculoskeletal:        General: Normal range of motion.     Cervical back: Neck supple.     Comments: Ambulates well with cane  Neurological: She is alert  and oriented to person, place, and time. No cranial nerve deficit.  Reflex Scores:      Patellar reflexes are 2+ on the right side and 2+ on the left side. Skin: Skin is warm and dry.           Assessment & Plan:

## 2020-02-12 NOTE — Assessment & Plan Note (Signed)
Chronic and unchanged. Encouraged regular activity and weight loss.

## 2020-02-12 NOTE — Assessment & Plan Note (Signed)
Compliant to calcium and vitamin D. °Repeat bone density due and pending. °

## 2020-02-12 NOTE — Assessment & Plan Note (Signed)
Denies concerns for anxiety and depression 

## 2020-02-12 NOTE — Assessment & Plan Note (Signed)
Recent ED evaluation, cleared large renal stone. Doing much better, continue to monitor.

## 2020-02-12 NOTE — Assessment & Plan Note (Signed)
Doing well on every other day Nexium, continue same.

## 2020-02-12 NOTE — Assessment & Plan Note (Signed)
Chronic, stable, continue furosemide.  Encouraged weight loss. Labs stable.

## 2020-02-12 NOTE — Assessment & Plan Note (Signed)
Immunizations UTD, declines Shingrix vaccines. Declines mammogram and colon cancer screening given age.  Orders placed for bone density scan. Encouraged regular activity, healthy diet.  Exam today stable. Labs reviewed.

## 2020-02-12 NOTE — Assessment & Plan Note (Signed)
Compliant to ramipril and metoprolol succinate. BP today stable.  Continue same.

## 2020-02-12 NOTE — Assessment & Plan Note (Signed)
Off of metformin to months due to stability in A1C. A1C today of 5.8 which is well controlled off treatment. Continue to monitor.

## 2020-02-12 NOTE — Patient Instructions (Signed)
It's important to improve your diet by reducing consumption of fast food, fried food, processed snack foods, sugary drinks. Increase consumption of fresh vegetables and fruits, whole grains, water.  Ensure you are drinking 64 ounces of water daily.  Start exercising. You should be getting 150 minutes of exercise weekly.  Call the breast center to schedule your bone density scan.  It was a pleasure to see you today!   Preventive Care 65 Years and Older, Female Preventive care refers to lifestyle choices and visits with your health care provider that can promote health and wellness. This includes:  A yearly physical exam. This is also called an annual well check.  Regular dental and eye exams.  Immunizations.  Screening for certain conditions.  Healthy lifestyle choices, such as diet and exercise. What can I expect for my preventive care visit? Physical exam Your health care provider will check:  Height and weight. These may be used to calculate body mass index (BMI), which is a measurement that tells if you are at a healthy weight.  Heart rate and blood pressure.  Your skin for abnormal spots. Counseling Your health care provider may ask you questions about:  Alcohol, tobacco, and drug use.  Emotional well-being.  Home and relationship well-being.  Sexual activity.  Eating habits.  History of falls.  Memory and ability to understand (cognition).  Work and work environment.  Pregnancy and menstrual history. What immunizations do I need?  Influenza (flu) vaccine  This is recommended every year. Tetanus, diphtheria, and pertussis (Tdap) vaccine  You may need a Td booster every 10 years. Varicella (chickenpox) vaccine  You may need this vaccine if you have not already been vaccinated. Zoster (shingles) vaccine  You may need this after age 60. Pneumococcal conjugate (PCV13) vaccine  One dose is recommended after age 65. Pneumococcal polysaccharide  (PPSV23) vaccine  One dose is recommended after age 65. Measles, mumps, and rubella (MMR) vaccine  You may need at least one dose of MMR if you were born in 1957 or later. You may also need a second dose. Meningococcal conjugate (MenACWY) vaccine  You may need this if you have certain conditions. Hepatitis A vaccine  You may need this if you have certain conditions or if you travel or work in places where you may be exposed to hepatitis A. Hepatitis B vaccine  You may need this if you have certain conditions or if you travel or work in places where you may be exposed to hepatitis B. Haemophilus influenzae type b (Hib) vaccine  You may need this if you have certain conditions. You may receive vaccines as individual doses or as more than one vaccine together in one shot (combination vaccines). Talk with your health care provider about the risks and benefits of combination vaccines. What tests do I need? Blood tests  Lipid and cholesterol levels. These may be checked every 5 years, or more frequently depending on your overall health.  Hepatitis C test.  Hepatitis B test. Screening  Lung cancer screening. You may have this screening every year starting at age 55 if you have a 30-pack-year history of smoking and currently smoke or have quit within the past 15 years.  Colorectal cancer screening. All adults should have this screening starting at age 50 and continuing until age 75. Your health care provider may recommend screening at age 45 if you are at increased risk. You will have tests every 1-10 years, depending on your results and the type of screening test.    Diabetes screening. This is done by checking your blood sugar (glucose) after you have not eaten for a while (fasting). You may have this done every 1-3 years.  Mammogram. This may be done every 1-2 years. Talk with your health care provider about how often you should have regular mammograms.  BRCA-related cancer screening.  This may be done if you have a family history of breast, ovarian, tubal, or peritoneal cancers. Other tests  Sexually transmitted disease (STD) testing.  Bone density scan. This is done to screen for osteoporosis. You may have this done starting at age 65. Follow these instructions at home: Eating and drinking  Eat a diet that includes fresh fruits and vegetables, whole grains, lean protein, and low-fat dairy products. Limit your intake of foods with high amounts of sugar, saturated fats, and salt.  Take vitamin and mineral supplements as recommended by your health care provider.  Do not drink alcohol if your health care provider tells you not to drink.  If you drink alcohol: ? Limit how much you have to 0-1 drink a day. ? Be aware of how much alcohol is in your drink. In the [place] one drink equals one 12 oz bottle of beer (355 mL), one 5 oz glass of wine (148 mL), or one 1 oz glass of hard liquor (44 mL). Lifestyle  Take daily care of your teeth and gums.  Stay active. Exercise for at least 30 minutes on 5 or more days each week.  Do not use any products that contain nicotine or tobacco, such as cigarettes, e-cigarettes, and chewing tobacco. If you need help quitting, ask your health care provider.  If you are sexually active, practice safe sex. Use a condom or other form of protection in order to prevent STIs (sexually transmitted infections).  Talk with your health care provider about taking a low-dose aspirin or statin. What's next?  Go to your health care provider once a year for a well check visit.  Ask your health care provider how often you should have your eyes and teeth checked.  Stay up to date on all vaccines. This information is not intended to replace advice given to you by your health care provider. Make sure you discuss any questions you have with your health care provider. Document Revised: 08/07/2018 Document Reviewed: 08/07/2018 Elsevier Patient Education   2020 [employer] Inc[employer]   

## 2020-02-12 NOTE — Assessment & Plan Note (Signed)
Chronic, stable, infrequent use of OTC products. Continue to monitor.

## 2020-03-09 ENCOUNTER — Other Ambulatory Visit: Payer: Self-pay | Admitting: Primary Care

## 2020-03-09 DIAGNOSIS — R600 Localized edema: Secondary | ICD-10-CM

## 2020-04-16 ENCOUNTER — Other Ambulatory Visit: Payer: Self-pay | Admitting: Primary Care

## 2020-04-16 DIAGNOSIS — I10 Essential (primary) hypertension: Secondary | ICD-10-CM

## 2020-05-11 ENCOUNTER — Other Ambulatory Visit: Payer: Self-pay | Admitting: Primary Care

## 2020-05-11 DIAGNOSIS — I10 Essential (primary) hypertension: Secondary | ICD-10-CM

## 2020-08-11 ENCOUNTER — Ambulatory Visit
Admission: RE | Admit: 2020-08-11 | Discharge: 2020-08-11 | Disposition: A | Discharge status: Home / Self Care | Payer: Medicare HMO | Source: Ambulatory Visit | Attending: Primary Care | Admitting: Primary Care

## 2020-08-11 ENCOUNTER — Other Ambulatory Visit: Payer: Self-pay

## 2020-08-11 DIAGNOSIS — E2839 Other primary ovarian failure: Secondary | ICD-10-CM | POA: Insufficient documentation

## 2020-09-01 ENCOUNTER — Other Ambulatory Visit: Payer: Self-pay | Admitting: Primary Care

## 2020-09-01 DIAGNOSIS — R600 Localized edema: Secondary | ICD-10-CM

## 2020-09-27 LAB — HM DIABETES EYE EXAM

## 2020-09-28 ENCOUNTER — Encounter: Payer: Self-pay | Admitting: Primary Care

## 2020-09-28 ENCOUNTER — Ambulatory Visit (INDEPENDENT_AMBULATORY_CARE_PROVIDER_SITE_OTHER): Payer: Medicare HMO

## 2020-09-28 ENCOUNTER — Other Ambulatory Visit: Payer: Self-pay

## 2020-09-28 DIAGNOSIS — Z0000 Encounter for general adult medical examination without abnormal findings: Secondary | ICD-10-CM

## 2020-09-28 NOTE — Patient Instructions (Signed)
[name] , Thank you for taking time to come for your Medicare Wellness Visit. I appreciate your ongoing commitment to your health goals. Please review the following plan we discussed and let me know if I can assist you in the future.   Screening recommendations/referrals: Colonoscopy: no longer required  Mammogram: declined Bone Density: Up to date, completed 08/11/2020, due 07/2022 Recommended yearly ophthalmology/optometry visit for glaucoma screening and checkup Recommended yearly dental visit for hygiene and checkup  Vaccinations: Influenza vaccine: due, Please scheudle appointment to receive soon Pneumococcal vaccine: Completed series Tdap vaccine: decline-insurance Shingles vaccine: due, check with your insurance regarding coverage if interested    Covid-19:2 doses complete, booster due, Please scheudle appointment to receive soon  Advanced directives: Please bring a copy of your POA (Power of Attorney) and/or Living Will to your next appointment.   Conditions/risks identified: diabetes, hypertension  Next appointment: Follow up in one year for your annual wellness visit    Preventive Care 65 Years and Older, Female Preventive care refers to lifestyle choices and visits with your health care provider that can promote health and wellness. What does preventive care include?  A yearly physical exam. This is also called an annual well check.  Dental exams once or twice a year.  Routine eye exams. Ask your health care provider how often you should have your eyes checked.  Personal lifestyle choices, including:  Daily care of your teeth and gums.  Regular physical activity.  Eating a healthy diet.  Avoiding tobacco and drug use.  Limiting alcohol use.  Practicing safe sex.  Taking low-dose aspirin every day.  Taking vitamin and mineral supplements as recommended by your health care provider. What happens during an annual well check? The services and screenings  done by your health care provider during your annual well check will depend on your age, overall health, lifestyle risk factors, and family history of disease. Counseling  Your health care provider may ask you questions about your:  Alcohol use.  Tobacco use.  Drug use.  Emotional well-being.  Home and relationship well-being.  Sexual activity.  Eating habits.  History of falls.  Memory and ability to understand (cognition).  Work and work Astronomer.  Reproductive health. Screening  You may have the following tests or measurements:  Height, weight, and BMI.  Blood pressure.  Lipid and cholesterol levels. These may be checked every 5 years, or more frequently if you are over 60 years old.  Skin check.  Lung cancer screening. You may have this screening every year starting at age 50 if you have a 30-pack-year history of smoking and currently smoke or have quit within the past 15 years.  Fecal occult blood test (FOBT) of the stool. You may have this test every year starting at age 20.  Flexible sigmoidoscopy or colonoscopy. You may have a sigmoidoscopy every 5 years or a colonoscopy every 10 years starting at age 46.  Hepatitis C blood test.  Hepatitis B blood test.  Sexually transmitted disease (STD) testing.  Diabetes screening. This is done by checking your blood sugar (glucose) after you have not eaten for a while (fasting). You may have this done every 1-3 years.  Bone density scan. This is done to screen for osteoporosis. You may have this done starting at age 70.  Mammogram. This may be done every 1-2 years. Talk to your health care provider about how often you should have regular mammograms. Talk with your health care provider about your test results, treatment  options, and if necessary, the need for more tests. Vaccines  Your health care provider may recommend certain vaccines, such as:  Influenza vaccine. This is recommended every year.  Tetanus,  diphtheria, and acellular pertussis (Tdap, Td) vaccine. You may need a Td booster every 10 years.  Zoster vaccine. You may need this after age 62.  Pneumococcal 13-valent conjugate (PCV13) vaccine. One dose is recommended after age 2.  Pneumococcal polysaccharide (PPSV23) vaccine. One dose is recommended after age 31. Talk to your health care provider about which screenings and vaccines you need and how often you need them. This information is not intended to replace advice given to you by your health care provider. Make sure you discuss any questions you have with your health care provider. Document Released: 09/09/2015 Document Revised: 05/02/2016 Document Reviewed: 06/14/2015 Elsevier Interactive Patient Education  2017 [place] Prevention in the Home Falls can cause injuries. They can happen to people of all ages. There are many things you can do to make your home safe and to help prevent falls. What can I do on the outside of my home?  Regularly fix the edges of walkways and driveways and fix any cracks.  Remove anything that might make you trip as you walk through a door, such as a raised step or threshold.  Trim any bushes or trees on the path to your home.  Use bright outdoor lighting.  Clear any walking paths of anything that might make someone trip, such as rocks or tools.  Regularly check to see if handrails are loose or broken. Make sure that both sides of any steps have handrails.  Any raised decks and porches should have guardrails on the edges.  Have any leaves, snow, or ice cleared regularly.  Use sand or salt on walking paths during winter.  Clean up any spills in your garage right away. This includes oil or grease spills. What can I do in the bathroom?  Use night lights.  Install grab bars by the toilet and in the tub and shower. Do not use towel bars as grab bars.  Use non-skid mats or decals in the tub or shower.  If you need to sit down in  the shower, use a plastic, non-slip stool.  Keep the floor dry. Clean up any water that spills on the floor as soon as it happens.  Remove soap buildup in the tub or shower regularly.  Attach bath mats securely with double-sided non-slip rug tape.  Do not have throw rugs and other things on the floor that can make you trip. What can I do in the bedroom?  Use night lights.  Make sure that you have a light by your bed that is easy to reach.  Do not use any sheets or blankets that are too big for your bed. They should not hang down onto the floor.  Have a firm chair that has side arms. You can use this for support while you get dressed.  Do not have throw rugs and other things on the floor that can make you trip. What can I do in the kitchen?  Clean up any spills right away.  Avoid walking on wet floors.  Keep items that you use a lot in easy-to-reach places.  If you need to reach something above you, use a strong step stool that has a grab bar.  Keep electrical cords out of the way.  Do not use floor polish or wax that makes floors slippery.  If you must use wax, use non-skid floor wax.  Do not have throw rugs and other things on the floor that can make you trip. What can I do with my stairs?  Do not leave any items on the stairs.  Make sure that there are handrails on both sides of the stairs and use them. Fix handrails that are broken or loose. Make sure that handrails are as long as the stairways.  Check any carpeting to make sure that it is firmly attached to the stairs. Fix any carpet that is loose or worn.  Avoid having throw rugs at the top or bottom of the stairs. If you do have throw rugs, attach them to the floor with carpet tape.  Make sure that you have a light switch at the top of the stairs and the bottom of the stairs. If you do not have them, ask someone to add them for you. What else can I do to help prevent falls?  Wear shoes that:  Do not have high  heels.  Have rubber bottoms.  Are comfortable and fit you well.  Are closed at the toe. Do not wear sandals.  If you use a stepladder:  Make sure that it is fully opened. Do not climb a closed stepladder.  Make sure that both sides of the stepladder are locked into place.  Ask someone to hold it for you, if possible.  Clearly mark and make sure that you can see:  Any grab bars or handrails.  First and last steps.  Where the edge of each step is.  Use tools that help you move around (mobility aids) if they are needed. These include:  Canes.  Walkers.  Scooters.  Crutches.  Turn on the lights when you go into a dark area. Replace any light bulbs as soon as they burn out.  Set up your furniture so you have a clear path. Avoid moving your furniture around.  If any of your floors are uneven, fix them.  If there are any pets around you, be aware of where they are.  Review your medicines with your doctor. Some medicines can make you feel dizzy. This can increase your chance of falling. Ask your doctor what other things that you can do to help prevent falls. This information is not intended to replace advice given to you by your health care provider. Make sure you discuss any questions you have with your health care provider. Document Released: 06/09/2009 Document Revised: 01/19/2016 Document Reviewed: 09/17/2014 Elsevier Interactive Patient Education  2017 [employer].

## 2020-09-28 NOTE — Progress Notes (Signed)
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

## 2020-09-28 NOTE — Progress Notes (Signed)
Subjective:   [name] is a 75 y.o. female who presents for Medicare Annual (Subsequent) preventive examination.  Review of Systems: N/A      I connected with the patient today by telephone and verified that I am speaking with the correct person using two identifiers. Location patient: home Location nurse: work Persons participating in the telephone visit: patient, nurse.   I discussed the limitations, risks, security and privacy concerns of performing an evaluation and management service by telephone and the availability of in person appointments. I also discussed with the patient that there may be a patient responsible charge related to this service. The patient expressed understanding and verbally consented to this telephonic visit.        Cardiac Risk Factors include: advanced age (>44men, >59 women);diabetes mellitus;hypertension     Objective:    Today's Vitals   There is no height or weight on file to calculate BMI.  Advanced Directives 09/28/2020 01/11/2020 01/16/2018 12/26/2015 02/11/2013 02/02/2013 01/18/2013  Does Patient Have a Medical Advance Directive? Yes No No No - Patient does not have advance directive;Patient would not like information Patient does not have advance directive  Type of Advance Directive Healthcare Power of [place];Living will - - - - - -  Copy of Healthcare Power of Attorney in Chart? No - copy requested - - - - - -  Would patient like information on creating a medical advance directive? - - Yes (MAU/Ambulatory/Procedural Areas - Information given) Yes - Educational materials given - - -  Pre-existing out of facility DNR order (yellow form or pink MOST form) - - - - No - No    Current Medications (verified) Outpatient Encounter Medications as of 09/28/2020  Medication Sig  . aspirin 81 MG tablet Take 81 mg by mouth daily.  . Calcium Carb-Cholecalciferol (CALCIUM 500+D PO) Take by mouth.  . esomeprazole (NEXIUM) 20 MG capsule Take 20 mg by mouth  every other day.  . furosemide (LASIX) 20 MG tablet TAKE 2 TABLETS (40 MG TOTAL) BY MOUTH DAILY. FOR LEG SWELLING.  . metoprolol succinate (TOPROL-XL) 50 MG 24 hr tablet TAKE 1 TABLET BY MOUTH DAILY WITH OR IMMEDIATELY FOLLOWING A MEAL.  . ramipril (ALTACE) 5 MG capsule TAKE 1 CAPSULE (5 MG TOTAL) BY MOUTH DAILY FOR BLOOD PRESSURE.  . vitamin B-12 (CYANOCOBALAMIN) 1000 MCG tablet Take 2,000 mcg by mouth daily.    No facility-administered encounter medications on file as of 09/28/2020.    Allergies (verified) Patient has no known allergies.   History: Past Medical History:  Diagnosis Date  . Acute meniscal tear of left knee 01/19/2013  . Blood transfusion without reported diagnosis   . Depression   . Diabetes mellitus    borderline  . DJD (degenerative joint disease)   . GERD (gastroesophageal reflux disease)   . Hyperglycemia   . Hypertension   . Peripheral neuropathy   . Restless leg   . Syncope and collapse    Past Surgical History:  Procedure Laterality Date  . APPENDECTOMY  1976  . CARDIAC CATHETERIZATION  2006   @ [place]  . 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 KNEE ARTHROSCOPY WITH MEDIAL MENISECTOMY Left 02/11/2013   Procedure: LEFT KNEE ARTHROSCOPY WITH MEDIAL MENISECTOMY;  Surgeon: Jacki Cones, MD;  Location: [facility];  Service: Orthopedics;  Laterality: Left;  Marland Kitchen MULTIPLE TOOTH EXTRACTIONS    . OMENTECTOMY  07/21/2012   Procedure: OMENTECTOMY;  Surgeon: Adolph Pollack, MD;  Location: [facility];  Service: General;  Laterality: N/A;  . right knee replacement  2008  . TONSILLECTOMY  childhood  . TUBAL LIGATION  1976  . UMBILICAL HERNIA REPAIR  07/21/2012   Procedure: HERNIA REPAIR UMBILICAL ADULT;  Surgeon: Adolph Pollackodd J Rosenbower, MD;  Location: [facility];  Service: General;  Laterality: N/A;   Family History  Problem Relation Age of Onset  . Parkinsonism Mother   . Hypertension Father   . Heart attack Father    Social History   Socioeconomic History   . Marital status: Married    Spouse name: Not on file  . Number of children: 2  . Years of education: Not on file  . Highest education level: Not on file  Occupational History  . Occupation: retired  Tobacco Use  . Smoking status: Former Smoker    Packs/day: 1.00    Years: 20.00    Pack years: 20.00    Types: Cigarettes  . Smokeless tobacco: Never Used  Vaping Use  . Vaping Use: Never used  Substance and Sexual Activity  . Alcohol use: Yes    Comment: ocassionally 1 glass of wine per mth  . Drug use: No  . Sexual activity: Never  Other Topics Concern  . Not on file  Social History Narrative   Patient would desire CPR.   Would not want feeding tubes or heroic measures.   Does not have HPOA.   Social Determinants of Health   Financial Resource Strain: Low Risk   . Difficulty of Paying Living Expenses: Not hard at all  Food Insecurity: No Food Insecurity  . Worried About Programme researcher, broadcasting/film/videounning Out of Food in the Last Year: Never true  . Ran Out of Food in the Last Year: Never true  Transportation Needs: No Transportation Needs  . Lack of Transportation (Medical): No  . Lack of Transportation (Non-Medical): No  Physical Activity: Inactive  . Days of Exercise per Week: 0 days  . Minutes of Exercise per Session: 0 min  Stress: No Stress Concern Present  . Feeling of Stress : Not at all  Social Connections: Not on file    Tobacco Counseling Counseling given: Not Answered   Clinical Intake:  Pre-visit preparation completed: Yes  Pain : No/denies pain     Nutritional Risks: None Diabetes: Yes CBG done?: No Did pt. bring in CBG monitor from home?: No  How often do you need to have someone help you when you read instructions, pamphlets, or other written materials from your doctor or pharmacy?: 1 - Never What is the last grade level you completed in school?: college graduate  Diabetic: Yes Nutrition Risk Assessment:  Has the patient had any N/V/D within the last 2 months?   No  Does the patient have any non-healing wounds?  No  Has the patient had any unintentional weight loss or weight gain?  No   Diabetes:  Is the patient diabetic?  Yes  If diabetic, was a CBG obtained today?  No telephone visit  Did the patient bring in their glucometer from home?  No  telephone visit  How often do you monitor your CBG's? none.   Financial Strains and Diabetes Management:  Are you having any financial strains with the device, your supplies or your medication? No .  Does the patient want to be seen by Chronic Care Management for management of their diabetes?  No  Would the patient like to be referred to a Nutritionist or for Diabetic Management?  No  Diabetic Exams:  Diabetic Eye Exam: Completed 09/27/2020 Diabetic Foot Exam: Overdue, Pt has been advised about the importance in completing this exam.    Interpreter Needed?: No  Information entered by :: CJohnson, LPN   Activities of Daily Living In your present state of health, do you have any difficulty performing the following activities: 09/28/2020  Hearing? N  Vision? N  Difficulty concentrating or making decisions? N  Walking or climbing stairs? N  Dressing or bathing? N  Doing errands, shopping? N  Preparing Food and eating ? N  Using the Toilet? N  In the past six months, have you accidently leaked urine? N  Do you have problems with loss of bowel control? N  Managing your Medications? N  Managing your Finances? N  Housekeeping or managing your Housekeeping? N  Some recent data might be hidden    Patient Care Team: Doreene Nest, NP as PCP - General (Internal Medicine) Ranee Gosselin, MD as Consulting Physician (Orthopedic Surgery) Avel Peace, MD as Consulting Physician (General Surgery)  Indicate any recent Medical Services you may have received from other than [facility] providers in the past year (date may be approximate).     Assessment:   This is a routine wellness examination for  Madelena.  Hearing/Vision screen  Hearing Screening   125Hz  250Hz  500Hz  1000Hz  2000Hz  3000Hz  4000Hz  6000Hz  8000Hz   Right ear:           Left ear:           Vision Screening Comments: Patient gets annual eye exams   Dietary issues and exercise activities discussed: Current Exercise Habits: The patient does not participate in regular exercise at present, Exercise limited by: None identified  Goals    . Increase physical activity     Starting 01/16/2018, I will attempt to exercise at [facility] for at least 60 min 1-2 days per week.     . Patient Stated     09/28/2020, I will maintain and continue medications as prescribed.       Depression Screen PHQ 2/9 Scores 09/28/2020 01/16/2018 12/27/2016 12/26/2015 09/23/2014 09/23/2014 09/30/2013  PHQ - 2 Score 0 1 6 6  0 0 0  PHQ- 9 Score 0 4 22 12  - - -    Fall Risk Fall Risk  09/28/2020 01/16/2018 12/27/2016 12/26/2015 09/23/2014  Falls in the past year? 0 No No No No  Number falls in past yr: 0 - - - -  Injury with Fall? 0 - - - -  Risk for fall due to : Medication side effect;Impaired balance/gait - - - -  Follow up Falls evaluation completed;Falls prevention discussed - - - -    FALL RISK PREVENTION PERTAINING TO THE HOME:  Any stairs in or around the home? Yes  If so, are there any without handrails? No  Home free of loose throw rugs in walkways, pet beds, electrical cords, etc? Yes  Adequate lighting in your home to reduce risk of falls? Yes   ASSISTIVE DEVICES UTILIZED TO PREVENT FALLS:  Life alert? No Use of a cane, walker or w/c? Yes  Grab bars in the bathroom? Yes  Shower chair or bench in shower? Yes  Elevated toilet seat or a handicapped toilet? No   TIMED UP AND GO:  Was the test performed? N/A telephone visit.   Cognitive Function: MMSE - Mini Mental State Exam 09/28/2020 01/16/2018 12/26/2015  Orientation to time 5 5 5   Orientation to Place 5 5 5   Registration 3 3  3  Attention/ Calculation 5 0 0  Recall 3 3 3   Language- name 2 objects -  0 0  Language- repeat 1 1 1   Language- follow 3 step command - 3 3  Language- read & follow direction - 0 0  Write a sentence - 0 0  Copy design - 0 0  Total score - 20 20  Mini Cog  Mini-Cog screen was completed. Maximum score is 22. A value of 0 denotes this part of the MMSE was not completed or the patient failed this part of the Mini-Cog screening.       Immunizations Immunization History  Administered Date(s) Administered  . Fluad Quad(high Dose 65+) 08/05/2019  . Influenza, High Dose Seasonal PF 07/05/2015  . Influenza,inj,Quad PF,6+ Mos 09/30/2013, 07/08/2017, 07/28/2018  . PFIZER(Purple Top)SARS-COV-2 Vaccination 10/12/2019, 11/02/2019  . Pneumococcal Conjugate-13 09/23/2014  . Pneumococcal Polysaccharide-23 04/02/2012  . Zoster 09/30/2013    TDAP status: Due, Education has been provided regarding the importance of this vaccine. Advised may receive this vaccine at local pharmacy or Health Dept. Aware to provide a copy of the vaccination record if obtained from local pharmacy or Health Dept. Verbalized acceptance and understanding.  Flu Vaccine status: Due, Education has been provided regarding the importance of this vaccine. Advised may receive this vaccine at local pharmacy or Health Dept. Aware to provide a copy of the vaccination record if obtained from local pharmacy or Health Dept. Verbalized acceptance and understanding.  Pneumococcal vaccine status: Up to date  Covid-19 vaccine status: 2 doses completed, booster due, will schedule appointment soon  Qualifies for Shingles Vaccine? Yes   Zostavax completed Yes   Shingrix Completed?: No.    Education has been provided regarding the importance of this vaccine. Patient has been advised to call insurance company to determine out of pocket expense if they have not yet received this vaccine. Advised may also receive vaccine at local pharmacy or Health Dept. Verbalized acceptance and understanding.  Screening Tests Health  Maintenance  Topic Date Due  . INFLUENZA VACCINE  03/27/2020  . COVID-19 Vaccine (3 - Booster for Pfizer series) 05/04/2020  . FOOT EXAM  08/04/2020  . HEMOGLOBIN A1C  08/09/2020  . MAMMOGRAM  09/28/2021 (Originally 10/22/2015)  . TETANUS/TDAP  09/29/2023 (Originally 06/02/1965)  . OPHTHALMOLOGY EXAM  09/27/2021  . DEXA SCAN  Completed  . Hepatitis C Screening  Completed  . PNA vac Low Risk Adult  Completed    Health Maintenance  Health Maintenance Due  Topic Date Due  . INFLUENZA VACCINE  03/27/2020  . COVID-19 Vaccine (3 - Booster for Pfizer series) 05/04/2020  . FOOT EXAM  08/04/2020  . HEMOGLOBIN A1C  08/09/2020    Colorectal cancer screening: No longer required.   Mammogram status: declined  Bone Density status: Completed 08/11/2020. Results reflect: Bone density results: OSTEOPENIA. Repeat every 2 years.  Lung Cancer Screening: (Low Dose CT Chest recommended if Age 3-80 years, 30 pack-year currently smoking OR have quit w/in 15years.) does not qualify.    Additional Screening:  Hepatitis C Screening: does qualify; Completed 12/26/2015  Vision Screening: Recommended annual ophthalmology exams for early detection of glaucoma and other disorders of the eye. Is the patient up to date with their annual eye exam?  Yes  Who is the provider or what is the name of the office in which the patient attends annual eye exams? [facility]  If pt is not established with a provider, would they like to be referred to a  provider to establish care? No .   Dental Screening: Recommended annual dental exams for proper oral hygiene  Community Resource Referral / Chronic Care Management: CRR required this visit?  No   CCM required this visit?  No      Plan:     I have personally reviewed and noted the following in the patient's chart:   . Medical and social history . Use of alcohol, tobacco or illicit drugs  . Current medications and supplements . Functional ability and  status . Nutritional status . Physical activity . Advanced directives . List of other physicians . Hospitalizations, surgeries, and ER visits in previous 12 months . Vitals . Screenings to include cognitive, depression, and falls . Referrals and appointments  In addition, I have reviewed and discussed with patient certain preventive protocols, quality metrics, and best practice recommendations. A written personalized care plan for preventive services as well as general preventive health recommendations were provided to patient.   Due to this being a telephonic visit, the after visit summary with patients personalized plan was offered to patient via office or my-chart.  Patient preferred to pick up at office at next visit or via mychart.   Janalyn Shy, LPN   10/30/7015

## 2020-10-16 ENCOUNTER — Other Ambulatory Visit: Payer: Self-pay | Admitting: Primary Care

## 2020-10-16 DIAGNOSIS — I10 Essential (primary) hypertension: Secondary | ICD-10-CM

## 2020-11-03 ENCOUNTER — Other Ambulatory Visit: Payer: Self-pay | Admitting: Primary Care

## 2020-11-03 DIAGNOSIS — I10 Essential (primary) hypertension: Secondary | ICD-10-CM

## 2020-11-03 NOTE — Telephone Encounter (Signed)
Patient due to see me in June 2022, please schedule. Will need to come in for further refills. Schedule CPE

## 2020-11-03 NOTE — Telephone Encounter (Signed)
Patient scheduled for cpe 02/14/21

## 2020-11-25 ENCOUNTER — Other Ambulatory Visit: Payer: Self-pay | Admitting: Primary Care

## 2020-11-25 DIAGNOSIS — R600 Localized edema: Secondary | ICD-10-CM

## 2021-01-23 ENCOUNTER — Other Ambulatory Visit: Payer: Self-pay | Admitting: Primary Care

## 2021-01-23 DIAGNOSIS — I10 Essential (primary) hypertension: Secondary | ICD-10-CM

## 2021-02-01 ENCOUNTER — Other Ambulatory Visit: Payer: Self-pay | Admitting: Primary Care

## 2021-02-01 DIAGNOSIS — I10 Essential (primary) hypertension: Secondary | ICD-10-CM

## 2021-02-14 ENCOUNTER — Encounter: Payer: Self-pay | Admitting: Primary Care

## 2021-02-14 ENCOUNTER — Ambulatory Visit (INDEPENDENT_AMBULATORY_CARE_PROVIDER_SITE_OTHER): Payer: Medicare HMO | Admitting: Primary Care

## 2021-02-14 ENCOUNTER — Ambulatory Visit (INDEPENDENT_AMBULATORY_CARE_PROVIDER_SITE_OTHER)
Admission: RE | Admit: 2021-02-14 | Discharge: 2021-02-14 | Disposition: A | Discharge status: Home / Self Care | Payer: Medicare HMO | Source: Ambulatory Visit | Attending: Primary Care | Admitting: Primary Care

## 2021-02-14 ENCOUNTER — Other Ambulatory Visit: Payer: Self-pay

## 2021-02-14 VITALS — BP 140/82 | HR 82 | Temp 97.2°F | Ht 63.0 in | Wt 241.0 lb

## 2021-02-14 DIAGNOSIS — I10 Essential (primary) hypertension: Secondary | ICD-10-CM | POA: Diagnosis not present

## 2021-02-14 DIAGNOSIS — Z8639 Personal history of other endocrine, nutritional and metabolic disease: Secondary | ICD-10-CM | POA: Diagnosis not present

## 2021-02-14 DIAGNOSIS — R0600 Dyspnea, unspecified: Secondary | ICD-10-CM | POA: Diagnosis not present

## 2021-02-14 DIAGNOSIS — R0609 Other forms of dyspnea: Secondary | ICD-10-CM

## 2021-02-14 DIAGNOSIS — F32A Depression, unspecified: Secondary | ICD-10-CM

## 2021-02-14 DIAGNOSIS — E538 Deficiency of other specified B group vitamins: Secondary | ICD-10-CM | POA: Diagnosis not present

## 2021-02-14 DIAGNOSIS — G6289 Other specified polyneuropathies: Secondary | ICD-10-CM

## 2021-02-14 DIAGNOSIS — R600 Localized edema: Secondary | ICD-10-CM | POA: Diagnosis not present

## 2021-02-14 DIAGNOSIS — K219 Gastro-esophageal reflux disease without esophagitis: Secondary | ICD-10-CM

## 2021-02-14 DIAGNOSIS — M8580 Other specified disorders of bone density and structure, unspecified site: Secondary | ICD-10-CM

## 2021-02-14 DIAGNOSIS — Z0001 Encounter for general adult medical examination with abnormal findings: Secondary | ICD-10-CM | POA: Diagnosis not present

## 2021-02-14 LAB — CBC
HCT: 42.5 % (ref 36.0–46.0)
Hemoglobin: 14.1 g/dL (ref 12.0–15.0)
MCHC: 33.2 g/dL (ref 30.0–36.0)
MCV: 87.9 fl (ref 78.0–100.0)
Platelets: 233 10*3/uL (ref 150.0–400.0)
RBC: 4.84 Mil/uL (ref 3.87–5.11)
RDW: 13.5 % (ref 11.5–15.5)
WBC: 8.3 10*3/uL (ref 4.0–10.5)

## 2021-02-14 LAB — BRAIN NATRIURETIC PEPTIDE: Pro B Natriuretic peptide (BNP): 115 pg/mL — ABNORMAL HIGH (ref 0.0–100.0)

## 2021-02-14 LAB — MICROALBUMIN / CREATININE URINE RATIO
Creatinine,U: 260.2 mg/dL
Microalb Creat Ratio: 2.3 mg/g (ref 0.0–30.0)
Microalb, Ur: 6.1 mg/dL — ABNORMAL HIGH (ref 0.0–1.9)

## 2021-02-14 LAB — COMPREHENSIVE METABOLIC PANEL
ALT: 22 U/L (ref 0–35)
AST: 20 U/L (ref 0–37)
Albumin: 4.3 g/dL (ref 3.5–5.2)
Alkaline Phosphatase: 84 U/L (ref 39–117)
BUN: 17 mg/dL (ref 6–23)
CO2: 29 mEq/L (ref 19–32)
Calcium: 10 mg/dL (ref 8.4–10.5)
Chloride: 105 mEq/L (ref 96–112)
Creatinine, Ser: 0.84 mg/dL (ref 0.40–1.20)
GFR: 67.37 mL/min (ref >60.00)
Glucose, Bld: 108 mg/dL — ABNORMAL HIGH (ref 70–99)
Potassium: 3.8 mEq/L (ref 3.5–5.1)
Sodium: 142 mEq/L (ref 135–145)
Total Bilirubin: 0.8 mg/dL (ref 0.2–1.2)
Total Protein: 6.4 g/dL (ref 6.0–8.3)

## 2021-02-14 LAB — LIPID PANEL
Cholesterol: 128 mg/dL (ref 0–200)
HDL: 49.6 mg/dL (ref >39.00)
LDL Cholesterol: 48 mg/dL (ref 0–99)
NonHDL: 77.91
Total CHOL/HDL Ratio: 3
Triglycerides: 151 mg/dL — ABNORMAL HIGH (ref 0.0–149.0)
VLDL: 30.2 mg/dL (ref 0.0–40.0)

## 2021-02-14 LAB — VITAMIN B12: Vitamin B-12: 956 pg/mL — ABNORMAL HIGH (ref 211–911)

## 2021-02-14 LAB — TSH: TSH: 4.43 u[IU]/mL (ref 0.35–4.50)

## 2021-02-14 LAB — HEMOGLOBIN A1C: Hgb A1c MFr Bld: 5.6 % (ref 4.6–6.5)

## 2021-02-14 NOTE — Assessment & Plan Note (Signed)
Stable in the office today, continue ramipril 5 mg, metoprolol succinate 50 mg.   Labs pending.

## 2021-02-14 NOTE — Assessment & Plan Note (Signed)
Bone density scan UTD. Compliant to calcium and vitamin D.

## 2021-02-14 NOTE — Assessment & Plan Note (Signed)
Chronic, continued.  Checking chest xray today. Checking labs.   Offered cardiology referral, especially given intermittent fluttering, for which she declines. Discussed that she could be experiencing paroxysmal atrial fibrillation or another heart condition. She verbalized understanding and continues to refuse referral.

## 2021-02-14 NOTE — Assessment & Plan Note (Signed)
Compliant to B12 supplements.  Repeat level pending.

## 2021-02-14 NOTE — Progress Notes (Signed)
Subjective:    Patient ID: [name], female    DOB: [birth year], 75 y.o.   MRN: [id number]  HPI  [name] is a very pleasant 75 y.o. female who presents today for complete physical.  She would also like to mention intermittent right lower side pain, intermittent diarrhea for the last year, worse over the last week. She does have a history of hemorrhoidal bleeding, has noticed some bleeding recently with diarrhea. Diarrhea will present after each meal, sometimes more loose stool. She had a Covid vaccine one week ago, thinks it's related.   She's also noticed symptoms of depression which include decreased interest in doing things. She doesn't want to "take anything", previous history of depression and was taking medications in the past.   She would also like to mention shortness of breath. Chronic, worse now. Occurs with rest and exertion. History of tobacco abuse, smoked for about 15 yeas, quit in the 1980's. She is exercising at the gym, started one week ago, was able to complete 30 minutes three times weekly. She is not sure of a history of asthma. She's also noticed infrequent "fluttering to my heart". Occurs sporadically, cannot pinpoint the last time it occurred, thinks it's due to "old age".   Immunizations: -Influenza: Due this season  -Covid-19: 2 vaccines, 1 booster -Shingles: Zostavax in 2015 -Pneumonia: Prevnar 13 in 2016, pneumovax in 2013   Diet: Fair diet.  Exercise: No regular exercise.  Eye exam: Completes annually  Dental exam: No recent visit.  Mammogram: Completed years ago. Declines.  Dexa: Completed in 2021, osteopenia  Colonoscopy: Completed Cologuard in 2018, declines given age.   BP Readings from Last 3 Encounters:  02/14/21 140/82  02/12/20 134/78  01/11/20 (!) 164/71       Review of Systems  Constitutional:  Negative for unexpected weight change.  HENT:  Negative for rhinorrhea.   Eyes:  Positive for visual disturbance.  Respiratory:   Negative for cough and shortness of breath.   Cardiovascular:  Negative for chest pain.  Gastrointestinal:  Negative for constipation and diarrhea.  Genitourinary:  Negative for difficulty urinating.  Musculoskeletal:  Negative for arthralgias and myalgias.  Skin:  Negative for rash.  Allergic/Immunologic: Negative for environmental allergies.  Neurological:  Positive for headaches. Negative for dizziness.       Headaches with screen time on her phone   Psychiatric/Behavioral:         See HPI        Past Medical History:  Diagnosis Date   Acute meniscal tear of left knee 01/19/2013   Blood transfusion without reported diagnosis    Depression    Diabetes mellitus    borderline   DJD (degenerative joint disease)    GERD (gastroesophageal reflux disease)    Hyperglycemia    Hypertension    Peripheral neuropathy    Restless leg    Syncope and collapse     Social History   Socioeconomic History   Marital status: Married    Spouse name: Not on file   Number of children: 2   Years of education: Not on file   Highest education level: Not on file  Occupational History   Occupation: retired  Tobacco Use   Smoking status: Former    Packs/day: 1.00    Years: 20.00    Pack years: 20.00    Types: Cigarettes   Smokeless tobacco: Never  Vaping Use   Vaping Use: Never used  Substance and Sexual Activity  Alcohol use: Yes    Comment: ocassionally 1 glass of wine per mth   Drug use: No   Sexual activity: Never  Other Topics Concern   Not on file  Social History Narrative   Patient would desire CPR.   Would not want feeding tubes or heroic measures.   Does not have HPOA.   Social Determinants of Health   Financial Resource Strain: Low Risk    Difficulty of Paying Living Expenses: Not hard at all  Food Insecurity: No Food Insecurity   Worried About Programme researcher, broadcasting/film/video in the Last Year: Never true   Ran Out of Food in the Last Year: Never true  Transportation Needs: No  Transportation Needs   Lack of Transportation (Medical): No   Lack of Transportation (Non-Medical): No  Physical Activity: Inactive   Days of Exercise per Week: 0 days   Minutes of Exercise per Session: 0 min  Stress: No Stress Concern Present   Feeling of Stress : Not at all  Social Connections: Not on file  Intimate Partner Violence: Not At Risk   Fear of Current or Ex-Partner: No   Emotionally Abused: No   Physically Abused: No   Sexually Abused: No    Past Surgical History:  Procedure Laterality Date   APPENDECTOMY  1976   CARDIAC CATHETERIZATION  2006   @ [facility] [place]   JOINT REPLACEMENT  06/2005   right total knee   KNEE ARTHROSCOPY WITH MEDIAL MENISECTOMY Left 02/11/2013   Procedure: LEFT KNEE ARTHROSCOPY WITH MEDIAL MENISECTOMY;  Surgeon: Jacki Cones, MD;  Location: [facility];  Service: Orthopedics;  Laterality: Left;   MULTIPLE TOOTH EXTRACTIONS     OMENTECTOMY  07/21/2012   Procedure: OMENTECTOMY;  Surgeon: Adolph Pollack, MD;  Location: [facility];  Service: General;  Laterality: N/A;   right knee replacement  2008   TONSILLECTOMY  childhood   TUBAL LIGATION  1976   UMBILICAL HERNIA REPAIR  07/21/2012   Procedure: HERNIA REPAIR UMBILICAL ADULT;  Surgeon: Adolph Pollack, MD;  Location: [facility];  Service: General;  Laterality: N/A;    Family History  Problem Relation Age of Onset   Parkinsonism Mother    Hypertension Father    Heart attack Father     No Known Allergies  Current Outpatient Medications on File Prior to Visit  Medication Sig Dispense Refill   aspirin 81 MG tablet Take 81 mg by mouth daily.     Calcium Carb-Cholecalciferol (CALCIUM 500+D PO) Take by mouth.     esomeprazole (NEXIUM) 20 MG capsule Take 20 mg by mouth every other day.     furosemide (LASIX) 20 MG tablet TAKE 2 TABLETS (40 MG TOTAL) BY MOUTH DAILY. FOR LEG SWELLING. 180 tablet 0   metoprolol succinate (TOPROL-XL) 50 MG 24 hr tablet Take 1 tablet (50 mg total) by mouth daily. For  blood pressure. 90 tablet 0   ramipril (ALTACE) 5 MG capsule TAKE 1 CAPSULE (5 MG TOTAL) BY MOUTH DAILY FOR BLOOD PRESSURE. NEED OFFICE VISIT FOR FURTHER REFILLS 30 capsule 0   vitamin B-12 (CYANOCOBALAMIN) 1000 MCG tablet Take 2,000 mcg by mouth daily.      No current facility-administered medications on file prior to visit.    BP 140/82   Pulse 82   Temp (!) 97.2 F (36.2 C) (Temporal)   Ht 5\' 3"  (1.6 m)   Wt 241 lb (109.3 kg)   SpO2 97%   BMI 42.69 kg/m  Objective:  Physical Exam HENT:     Right Ear: Tympanic membrane and ear canal normal.     Left Ear: Tympanic membrane and ear canal normal.     Nose: Nose normal.  Eyes:     Conjunctiva/sclera: Conjunctivae normal.     Pupils: Pupils are equal, round, and reactive to light.  Neck:     Thyroid: No thyromegaly.  Cardiovascular:     Rate and Rhythm: Normal rate and regular rhythm.     Heart sounds: No murmur heard.    Comments: Mild to moderate lower extremity edema noted bilaterally. Chronic. Pulmonary:     Effort: Pulmonary effort is normal.     Breath sounds: Normal breath sounds. No rales.  Abdominal:     General: Bowel sounds are normal.     Palpations: Abdomen is soft.     Tenderness: There is no abdominal tenderness.  Musculoskeletal:        General: Normal range of motion.     Cervical back: Neck supple.  Lymphadenopathy:     Cervical: No cervical adenopathy.  Skin:    General: Skin is warm and dry.     Findings: No rash.  Neurological:     Mental Status: She is alert and oriented to person, place, and time.     Cranial Nerves: No cranial nerve deficit.     Deep Tendon Reflexes: Reflexes are normal and symmetric.  Psychiatric:        Mood and Affect: Mood normal.          Assessment & Plan:      This visit occurred during the SARS-CoV-2 public health emergency.  Safety protocols were in place, including screening questions prior to the visit, additional usage of staff PPE, and extensive  cleaning of exam room while observing appropriate contact time as indicated for disinfecting solutions.

## 2021-02-14 NOTE — Addendum Note (Signed)
Addended by: Doreene Nest on: 02/14/2021 12:52 PM   Modules accepted: Orders

## 2021-02-14 NOTE — Assessment & Plan Note (Signed)
Doing well on Nexium 20 mg, continue same.  

## 2021-02-14 NOTE — Assessment & Plan Note (Signed)
No longer on treatment. Repeat A1C pending.

## 2021-02-14 NOTE — Assessment & Plan Note (Signed)
Doing well on furosemide for which she is taking 20 mg daily, 40 mg every other day. Continue same.  Labs pending.

## 2021-02-14 NOTE — Assessment & Plan Note (Signed)
Immunizations UTD. Declines mammogram and colon cancer screening given age.  Bone density scan UTD.  Discussed the importance of a healthy diet and regular exercise in order for weight loss, and to reduce the risk of further co-morbidity.  Exam today stable.  Labs pending.

## 2021-02-14 NOTE — Assessment & Plan Note (Signed)
Mild symptoms. Declines treatment (therapy and meds). Continue to monitor.

## 2021-02-14 NOTE — Assessment & Plan Note (Signed)
Chronic, stable. No concerns today.  

## 2021-02-14 NOTE — Patient Instructions (Signed)
Stop by the lab prior to leaving today. I will notify you of your results once received.   It was a pleasure to see you today!  Preventive Care 77 Years and Older, Female Preventive care refers to lifestyle choices and visits with your health care provider that can promote health and wellness. This includes: A yearly physical exam. This is also called an annual wellness visit. Regular dental and eye exams. Immunizations. Screening for certain conditions. Healthy lifestyle choices, such as: Eating a healthy diet. Getting regular exercise. Not using drugs or products that contain nicotine and tobacco. Limiting alcohol use. What can I expect for my preventive care visit? Physical exam Your health care provider will check your: Height and weight. These may be used to calculate your BMI (body mass index). BMI is a measurement that tells if you are at a healthy weight. Heart rate and blood pressure. Body temperature. Skin for abnormal spots. Counseling Your health care provider may ask you questions about your: Past medical problems. Family's medical history. Alcohol, tobacco, and drug use. Emotional well-being. Home life and relationship well-being. Sexual activity. Diet, exercise, and sleep habits. History of falls. Memory and ability to understand (cognition). Work and work environment. Pregnancy and menstrual history. Access to firearms. What immunizations do I need?  Vaccines are usually given at various ages, according to a schedule. Your health care provider will recommend vaccines for you based on your age, medicalhistory, and lifestyle or other factors, such as travel or where you work. What tests do I need? Blood tests Lipid and cholesterol levels. These may be checked every 5 years, or more often depending on your overall health. Hepatitis C test. Hepatitis B test. Screening Lung cancer screening. You may have this screening every year starting at age 55 if you have  a 30-pack-year history of smoking and currently smoke or have quit within the past 15 years. Colorectal cancer screening. All adults should have this screening starting at age 50 and continuing until age 75. Your health care provider may recommend screening at age 45 if you are at increased risk. You will have tests every 1-10 years, depending on your results and the type of screening test. Diabetes screening. This is done by checking your blood sugar (glucose) after you have not eaten for a while (fasting). You may have this done every 1-3 years. Mammogram. This may be done every 1-2 years. Talk with your health care provider about how often you should have regular mammograms. Abdominal aortic aneurysm (AAA) screening. You may need this if you are a current or former smoker. BRCA-related cancer screening. This may be done if you have a family history of breast, ovarian, tubal, or peritoneal cancers. Other tests STD (sexually transmitted disease) testing, if you are at risk. Bone density scan. This is done to screen for osteoporosis. You may have this done starting at age 77. Talk with your health care provider about your test results, treatment options,and if necessary, the need for more tests. Follow these instructions at home: Eating and drinking  Eat a diet that includes fresh fruits and vegetables, whole grains, lean protein, and low-fat dairy products. Limit your intake of foods with high amounts of sugar, saturated fats, and salt. Take vitamin and mineral supplements as recommended by your health care provider. Do not drink alcohol if your health care provider tells you not to drink. If you drink alcohol: Limit how much you have to 0-1 drink a day. Be aware of how much alcohol is   in your drink. In the [place] one drink equals one 12 oz bottle of beer (355 mL), one 5 oz glass of wine (148 mL), or one 1 oz glass of hard liquor (44 mL).  Lifestyle Take daily care of your teeth and  gums. Brush your teeth every morning and night with fluoride toothpaste. Floss one time each day. Stay active. Exercise for at least 30 minutes 5 or more days each week. Do not use any products that contain nicotine or tobacco, such as cigarettes, e-cigarettes, and chewing tobacco. If you need help quitting, ask your health care provider. Do not use drugs. If you are sexually active, practice safe sex. Use a condom or other form of protection in order to prevent STIs (sexually transmitted infections). Talk with your health care provider about taking a low-dose aspirin or statin. Find healthy ways to cope with stress, such as: Meditation, yoga, or listening to music. Journaling. Talking to a trusted person. Spending time with friends and family. Safety Always wear your seat belt while driving or riding in a vehicle. Do not drive: If you have been drinking alcohol. Do not ride with someone who has been drinking. When you are tired or distracted. While texting. Wear a helmet and other protective equipment during sports activities. If you have firearms in your house, make sure you follow all gun safety procedures. What's next? Visit your health care provider once a year for an annual wellness visit. Ask your health care provider how often you should have your eyes and teeth checked. Stay up to date on all vaccines. This information is not intended to replace advice given to you by your health care provider. Make sure you discuss any questions you have with your healthcare provider. Document Revised: 08/03/2020 Document Reviewed: 08/07/2018 Elsevier Patient Education  2022 [employer] Inc[employer]  

## 2021-02-19 ENCOUNTER — Other Ambulatory Visit: Payer: Self-pay | Admitting: Primary Care

## 2021-02-19 DIAGNOSIS — R600 Localized edema: Secondary | ICD-10-CM

## 2021-02-24 ENCOUNTER — Other Ambulatory Visit: Payer: Self-pay | Admitting: Primary Care

## 2021-02-24 DIAGNOSIS — I10 Essential (primary) hypertension: Secondary | ICD-10-CM

## 2021-04-24 ENCOUNTER — Other Ambulatory Visit: Payer: Self-pay | Admitting: Primary Care

## 2021-04-24 DIAGNOSIS — I10 Essential (primary) hypertension: Secondary | ICD-10-CM

## 2021-05-17 ENCOUNTER — Other Ambulatory Visit: Payer: Self-pay | Admitting: Primary Care

## 2021-05-17 DIAGNOSIS — R600 Localized edema: Secondary | ICD-10-CM

## 2021-08-16 ENCOUNTER — Other Ambulatory Visit: Payer: Self-pay | Admitting: Primary Care

## 2021-08-16 DIAGNOSIS — R600 Localized edema: Secondary | ICD-10-CM

## 2021-09-02 IMAGING — CT CT RENAL STONE PROTOCOL
2 of 4 series · 17 of 46 positions shown, 19 images · non-contrast
Comparison: 07/05/2017

CLINICAL DATA: Flank pain

EXAM:
CT ABDOMEN AND PELVIS WITHOUT CONTRAST
TECHNIQUE: Multidetector CT imaging of the abdomen and pelvis was performed
following the standard protocol without IV contrast.

[Series 4: stone full standard · axial · 0.93mm/px · z∈[-987,-542]mm · 14 of 99 slices shown, 16 images]
[im 5/99  soft-tissue]
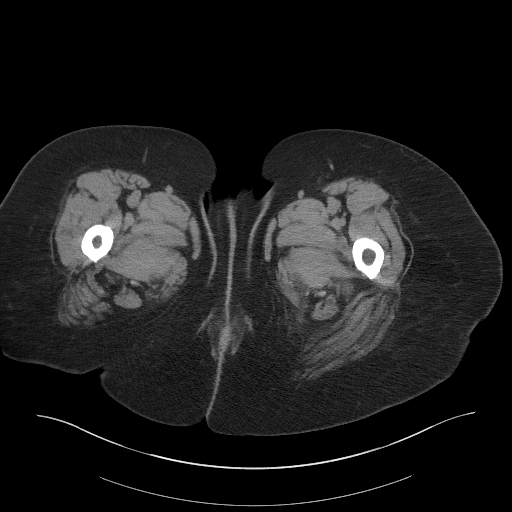
[im 5/99  bone]
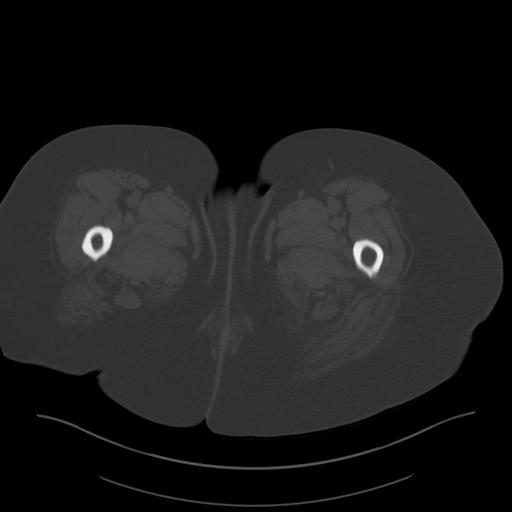
[im 13/99  soft-tissue]
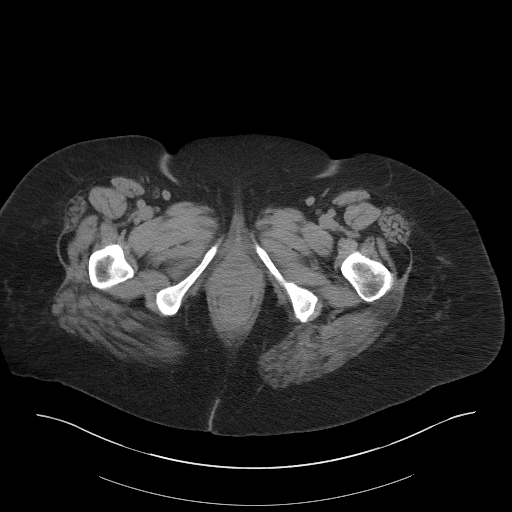
[im 21/99  soft-tissue]
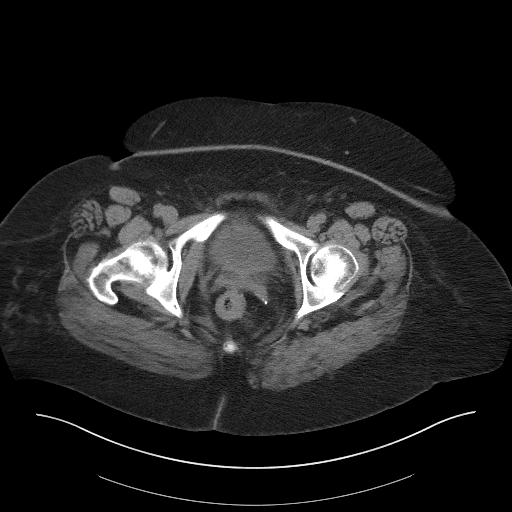
[im 25/99  soft-tissue]
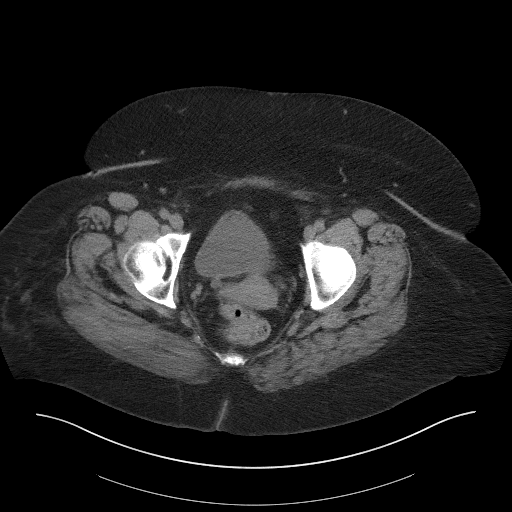
[im 33/99  soft-tissue]
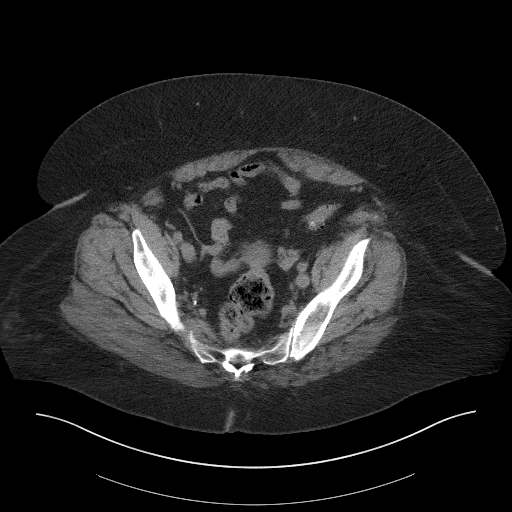
[im 41/99  soft-tissue]
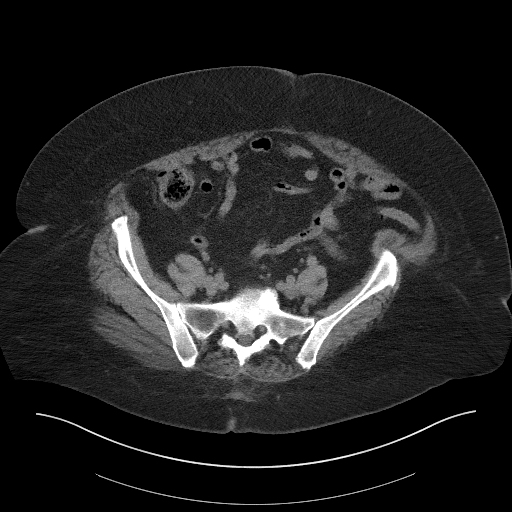
[im 45/99  soft-tissue]
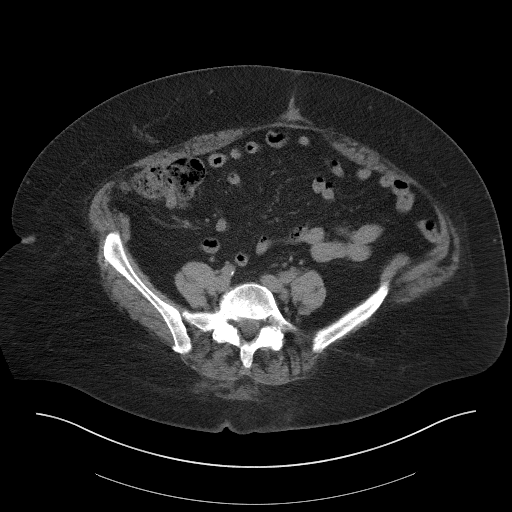
[im 54/99  soft-tissue]
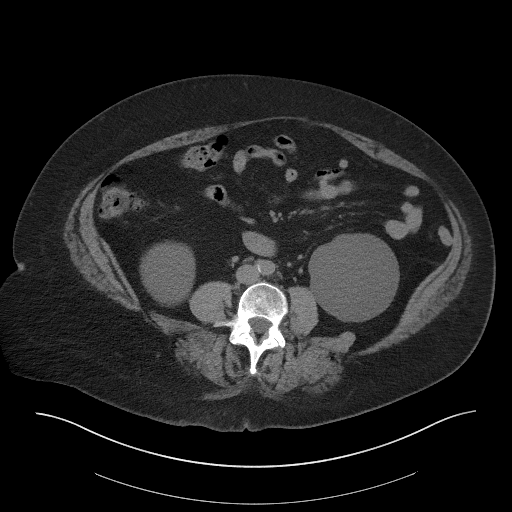
[im 58/99  soft-tissue]
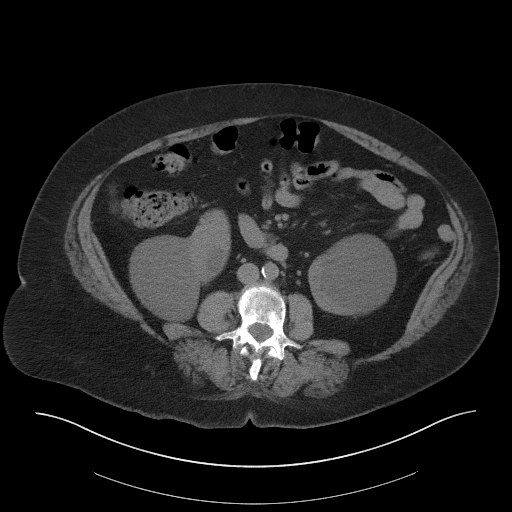
[im 58/99  bone]
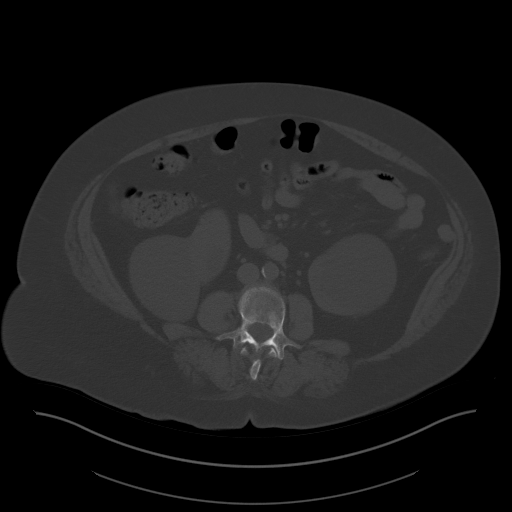
[im 66/99  soft-tissue]
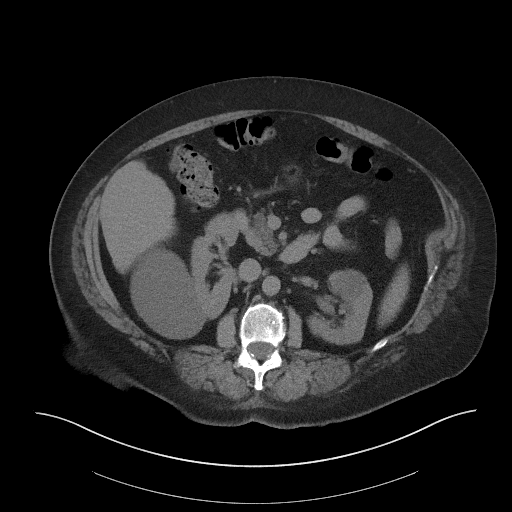
[im 74/99  soft-tissue]
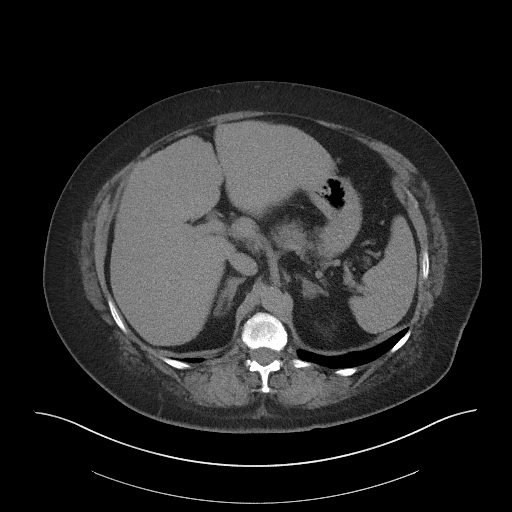
[im 78/99  soft-tissue]
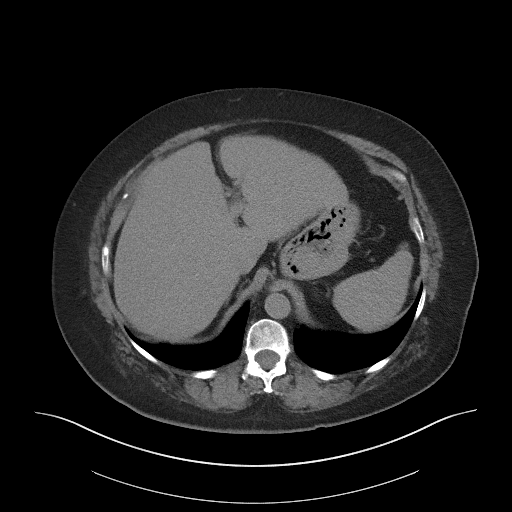
[im 86/99  soft-tissue]
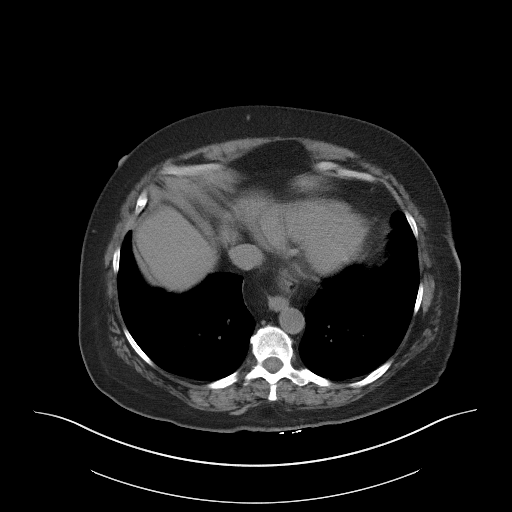
[im 94/99  soft-tissue]
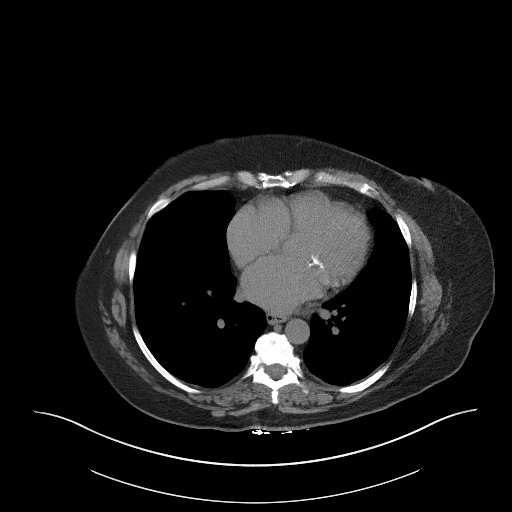

[Series 5: coronal · coronal · 0.95mm/px · 3 of 169 slices shown]
[im 57/169  soft-tissue]
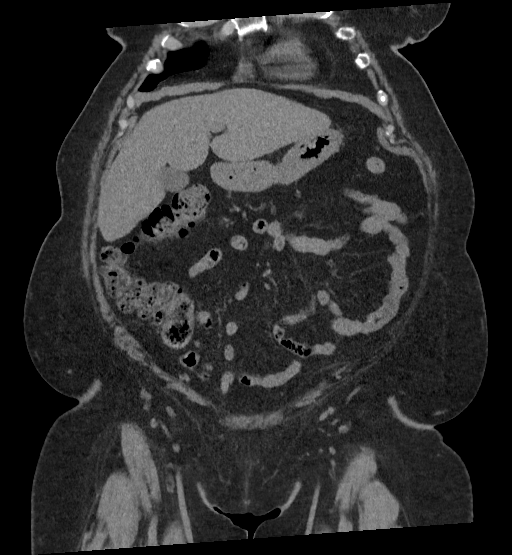
[im 75/169  soft-tissue]
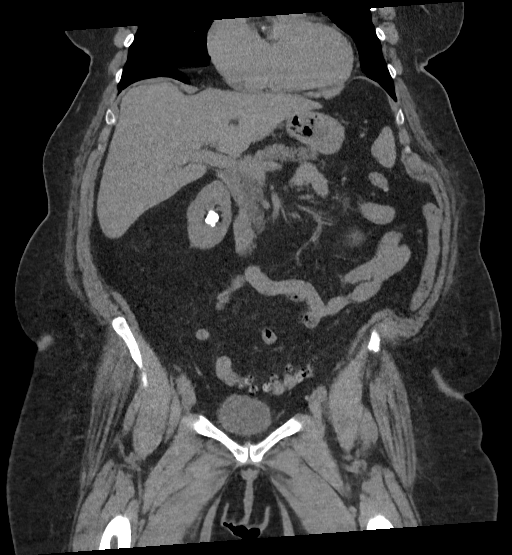
[im 94/169  soft-tissue]
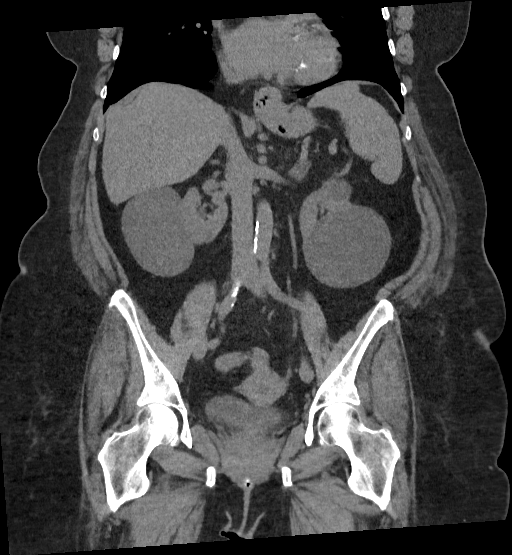

[17 of 46 positions shown; findings below may reference images not displayed]

FINDINGS: Lower chest: Lung bases are clear. No effusions. Heart is normal
size.

Hepatobiliary: Small gallstone noted within the gallbladder, stable.
No focal hepatic abnormality.

Pancreas: No focal abnormality or ductal dilatation.

Spleen: No focal abnormality.  Normal size.

Adrenals/Urinary Tract: Small nodule in the left adrenal gland
measures 15 mm, stable. Bilateral renal cysts are stable. Midpole
right renal stone measures 12 mm, stable. No ureteral stones or
hydronephrosis. Inferior to the bladder, there is a midline
calcification measuring 9 mm which could reflect a urethral stone.
Urinary bladder is decompressed.

Stomach/Bowel: Sigmoid diverticulosis. No active diverticulitis.
Stomach and small bowel decompressed, unremarkable.

Vascular/Lymphatic: Aortic atherosclerosis. No enlarged abdominal or
pelvic lymph nodes.

Reproductive: Uterus and adnexa unremarkable.  No mass.

Other: No free fluid or free air.

Musculoskeletal: No acute bony abnormality.
IMPRESSION: Right nephrolithiasis.  No hydronephrosis.

9 mm calcification inferior to the bladder may reflect urethral
stone.

Sigmoid diverticulosis.

Cholelithiasis.

Aortic atherosclerosis.

## 2021-09-28 NOTE — Progress Notes (Deleted)
Subjective:   [name] is a 76 y.o. female who presents for Medicare Annual (Subsequent) preventive examination.  I connected with Stephani PoliceRachel Zaro today by telephone and verified that I am speaking with the correct person using two identifiers. Location patient: home Location provider: work Persons participating in the virtual visit: patient, Engineer, civil (consulting)nurse.    I discussed the limitations, risks, security and privacy concerns of performing an evaluation and management service by telephone and the availability of in person appointments. I also discussed with the patient that there may be a patient responsible charge related to this service. The patient expressed understanding and verbally consented to this telephonic visit.    Interactive audio and video telecommunications were attempted between this provider and patient, however failed, due to patient having technical difficulties OR patient did not have access to video capability.  We continued and completed visit with audio only.  Some vital signs may be absent or patient reported.   Time Spent with patient on telephone encounter: *** minutes  Review of Systems           Objective:    There were no vitals filed for this visit. There is no height or weight on file to calculate BMI.  Advanced Directives 09/28/2020 01/11/2020 01/16/2018 12/26/2015 02/11/2013 02/02/2013 01/18/2013  Does Patient Have a Medical Advance Directive? Yes No No No - Patient does not have advance directive;Patient would not like information Patient does not have advance directive  Type of Advance Directive Healthcare Power of [place];Living will - - - - - -  Copy of Healthcare Power of Attorney in Chart? No - copy requested - - - - - -  Would patient like information on creating a medical advance directive? - - Yes (MAU/Ambulatory/Procedural Areas - Information given) Yes - Educational materials given - - -  Pre-existing out of facility DNR order (yellow form or pink  MOST form) - - - - No - No    Current Medications (verified) Outpatient Encounter Medications as of 09/29/2021  Medication Sig   aspirin 81 MG tablet Take 81 mg by mouth daily.   Calcium Carb-Cholecalciferol (CALCIUM 500+D PO) Take by mouth.   esomeprazole (NEXIUM) 20 MG capsule Take 20 mg by mouth every other day.   furosemide (LASIX) 20 MG tablet Take 1-2 tablets (20-40 mg total) by mouth daily. For leg swelling.   metoprolol succinate (TOPROL-XL) 50 MG 24 hr tablet TAKE 1 TABLET (50 MG TOTAL) BY MOUTH DAILY. FOR BLOOD PRESSURE.   ramipril (ALTACE) 5 MG capsule TAKE 1 CAPSULE (5 MG TOTAL) BY MOUTH DAILY FOR BLOOD PRESSURE.   vitamin B-12 (CYANOCOBALAMIN) 1000 MCG tablet Take 2,000 mcg by mouth daily.    No facility-administered encounter medications on file as of 09/29/2021.    Allergies (verified) Patient has no known allergies.   History: Past Medical History:  Diagnosis Date   Acute meniscal tear of left knee 01/19/2013   Blood transfusion without reported diagnosis    Depression    Diabetes mellitus    borderline   DJD (degenerative joint disease)    GERD (gastroesophageal reflux disease)    Hyperglycemia    Hypertension    Peripheral neuropathy    Restless leg    Syncope and collapse    Past Surgical History:  Procedure Laterality Date   APPENDECTOMY  1976   [facility]   JOINT REPLACEMENT  06/2005   right total knee   KNEE ARTHROSCOPY WITH MEDIAL  MENISECTOMY Left 02/11/2013   Procedure: LEFT KNEE ARTHROSCOPY WITH MEDIAL MENISECTOMY;  Surgeon: Jacki Conesonald A Gioffre, MD;  Location: [facility];  Service: Orthopedics;  Laterality: Left;   MULTIPLE TOOTH EXTRACTIONS     OMENTECTOMY  07/21/2012   Procedure: OMENTECTOMY;  Surgeon: Adolph Pollackodd J Rosenbower, MD;  Location: [facility];  Service: General;  Laterality: N/A;   right knee replacement  2008   TONSILLECTOMY  childhood   TUBAL LIGATION  1976   UMBILICAL HERNIA REPAIR  07/21/2012   Procedure: HERNIA  REPAIR UMBILICAL ADULT;  Surgeon: Adolph Pollackodd J Rosenbower, MD;  Location: [facility];  Service: General;  Laterality: N/A;   Family History  Problem Relation Age of Onset   Parkinsonism Mother    Hypertension Father    Heart attack Father    Social History   Socioeconomic History   Marital status: Married    Spouse name: Not on file   Number of children: 2   Years of education: Not on file   Highest education level: Not on file  Occupational History   Occupation: retired  Tobacco Use   Smoking status: Former    Packs/day: 1.00    Years: 20.00    Pack years: 20.00    Types: Cigarettes   Smokeless tobacco: Never  Vaping Use   Vaping Use: Never used  Substance and Sexual Activity   Alcohol use: Yes    Comment: ocassionally 1 glass of wine per mth   Drug use: No   Sexual activity: Never  Other Topics Concern   Not on file  Social History Narrative   Patient would desire CPR.   Would not want feeding tubes or heroic measures.   Does not have HPOA.   Social Determinants of Health   Financial Resource Strain: Low Risk    Difficulty of Paying Living Expenses: Not hard at all  Food Insecurity: No Food Insecurity   Worried About Programme researcher, broadcasting/film/videounning Out of Food in the Last Year: Never true   Ran Out of Food in the Last Year: Never true  Transportation Needs: No Transportation Needs   Lack of Transportation (Medical): No   Lack of Transportation (Non-Medical): No  Physical Activity: Inactive   Days of Exercise per Week: 0 days   Minutes of Exercise per Session: 0 min  Stress: No Stress Concern Present   Feeling of Stress : Not at all  Social Connections: Not on file    Tobacco Counseling Counseling given: Not Answered   Clinical Intake:                 Diabetic?***         Activities of Daily Living In your present state of health, do you have any difficulty performing the following activities: 09/28/2020  Hearing? N  Vision? N  Difficulty concentrating or making  decisions? N  Walking or climbing stairs? N  Dressing or bathing? N  Doing errands, shopping? N  Preparing Food and eating ? N  Using the Toilet? N  In the past six months, have you accidently leaked urine? N  Do you have problems with loss of bowel control? N  Managing your Medications? N  Managing your Finances? N  Housekeeping or managing your Housekeeping? N  Some recent data might be hidden    Patient Care Team: Doreene Nestlark, Katherine K, NP as PCP - General (Internal Medicine) Ranee GosselinGioffre, Ronald, MD as Consulting Physician (Orthopedic Surgery) Avel Peaceosenbower, Todd, MD as Consulting Physician (General Surgery)  Indicate any recent Medical Services you  may have received from other than [facility] providers in the past year (date may be approximate).     Assessment:   This is a routine wellness examination for [place].  Hearing/Vision screen No results found.  Dietary issues and exercise activities discussed:     Goals Addressed   None    Depression Screen PHQ 2/9 Scores 09/28/2020 01/16/2018 12/27/2016 12/26/2015 09/23/2014 09/23/2014 09/30/2013  PHQ - 2 Score 0 1 6 6  0 0 0  PHQ- 9 Score 0 4 22 12  - - -    Fall Risk Fall Risk  02/14/2021 09/28/2020 01/16/2018 12/27/2016 12/26/2015  Falls in the past year? 0 0 No No No  Number falls in past yr: 0 0 - - -  Injury with Fall? 0 0 - - -  Risk for fall due to : Impaired balance/gait Medication side effect;Impaired balance/gait - - -  Follow up - Falls evaluation completed;Falls prevention discussed - - -    FALL RISK PREVENTION PERTAINING TO THE HOME:  Any stairs in or around the home? {YES/NO:21197} If so, are there any without handrails? {YES/NO:21197} Home free of loose throw rugs in walkways, pet beds, electrical cords, etc? {YES/NO:21197} Adequate lighting in your home to reduce risk of falls? {YES/NO:21197}  ASSISTIVE DEVICES UTILIZED TO PREVENT FALLS:  Life alert? {YES/NO:21197} Use of a cane, walker or w/c? {YES/NO:21197} Grab bars in the  bathroom? {YES/NO:21197} Shower chair or bench in shower? {YES/NO:21197} Elevated toilet seat or a handicapped toilet? {YES/NO:21197}  TIMED UP AND GO:  Was the test performed? No .    Cognitive Function: MMSE - Mini Mental State Exam 09/28/2020 01/16/2018 12/26/2015  Orientation to time 5 5 5   Orientation to Place 5 5 5   Registration 3 3 3   Attention/ Calculation 5 0 0  Recall 3 3 3   Language- name 2 objects - 0 0  Language- repeat 1 1 1   Language- follow 3 step command - 3 3  Language- read & follow direction - 0 0  Write a sentence - 0 0  Copy design - 0 0  Total score - 20 20        Immunizations Immunization History  Administered Date(s) Administered   Fluad Quad(high Dose 65+) 08/05/2019   Influenza, High Dose Seasonal PF 07/05/2015   Influenza,inj,Quad PF,6+ Mos 09/30/2013, 07/08/2017, 07/28/2018   PFIZER Comirnaty(Gray Top)Covid-19 Tri-Sucrose Vaccine 02/09/2021   PFIZER(Purple Top)SARS-COV-2 Vaccination 10/12/2019, 11/02/2019   Pneumococcal Conjugate-13 09/23/2014   Pneumococcal Polysaccharide-23 04/02/2012   Zoster, Live 09/30/2013    TDAP status: Due, Education has been provided regarding the importance of this vaccine. Advised may receive this vaccine at local pharmacy or Health Dept. Aware to provide a copy of the vaccination record if obtained from local pharmacy or Health Dept. Verbalized acceptance and understanding.  {Flu Vaccine status:2101806}  Pneumococcal vaccine status: Up to date  {Covid-19 vaccine status:2101808}  Qualifies for Shingles Vaccine? Yes   Zostavax completed Yes   {Shingrix Completed?:2101804}  Screening Tests Health Maintenance  Topic Date Due   Zoster Vaccines- Shingrix (1 of 2) Never done   FOOT EXAM  08/04/2020   INFLUENZA VACCINE  03/27/2021   COVID-19 Vaccine (4 - Booster for Pfizer series) 04/06/2021   HEMOGLOBIN A1C  08/16/2021   OPHTHALMOLOGY EXAM  09/27/2021   MAMMOGRAM  09/28/2021 (Originally 10/22/2015)    TETANUS/TDAP  09/29/2023 (Originally 06/02/1965)   Pneumonia Vaccine 41+ Years old  Completed   DEXA SCAN  Completed   Hepatitis C Screening  Completed  HPV VACCINES  Aged Out   Fecal DNA (Cologuard)  Discontinued    Health Maintenance  Health Maintenance Due  Topic Date Due   Zoster Vaccines- Shingrix (1 of 2) Never done   FOOT EXAM  08/04/2020   INFLUENZA VACCINE  03/27/2021   COVID-19 Vaccine (4 - Booster for Pfizer series) 04/06/2021   HEMOGLOBIN A1C  08/16/2021   OPHTHALMOLOGY EXAM  09/27/2021    Colorectal cancer screening: No longer required.   {Mammogram status:21018020}  Bone Density status: Completed 08/11/20. Results reflect: Bone density results: OSTEOPENIA. Repeat every 2 years.  Lung Cancer Screening: (Low Dose CT Chest recommended if Age 69-80 years, 30 pack-year currently smoking OR have quit w/in 15years.) does not qualify.     Additional Screening:  Hepatitis C Screening: does qualify; Completed 12/26/15  Vision Screening: Recommended annual ophthalmology exams for early detection of glaucoma and other disorders of the eye. Is the patient up to date with their annual eye exam?  {YES/NO:21197} Who is the provider or what is the name of the office in which the patient attends annual eye exams? *** If pt is not established with a provider, would they like to be referred to a provider to establish care? {YES/NO:21197}.   Dental Screening: Recommended annual dental exams for proper oral hygiene  Community Resource Referral / Chronic Care Management: CRR required this visit?  {YES/NO:21197}  CCM required this visit?  {YES/NO:21197}     Plan:     I have personally reviewed and noted the following in the patients chart:   Medical and social history Use of alcohol, tobacco or illicit drugs  Current medications and supplements including opioid prescriptions.  Functional ability and status Nutritional status Physical activity Advanced directives List  of other physicians Hospitalizations, surgeries, and ER visits in previous 12 months Vitals Screenings to include cognitive, depression, and falls Referrals and appointments  In addition, I have reviewed and discussed with patient certain preventive protocols, quality metrics, and best practice recommendations. A written personalized care plan for preventive services as well as general preventive health recommendations were provided to patient.   Due to this being a telephonic visit, the after visit summary with patients personalized plan was offered to patient via mail or my-chart. ***Patient declined at this time./ Patient would like to access on my-chart/ per request, patient was mailed a copy of AVS./ Patient preferred to pick up at office at next visit.   Janne Lab, LPN   03/01/5464   Nurse Health Advisor  Nurse Notes: none

## 2021-09-29 ENCOUNTER — Ambulatory Visit: Payer: Medicare HMO

## 2021-10-02 LAB — HM DIABETES EYE EXAM

## 2021-10-31 ENCOUNTER — Telehealth: Payer: Self-pay

## 2021-10-31 NOTE — Telephone Encounter (Signed)
Pt is on Calcium 600mg  + D 20 mcg. She is asking how much of that should she take daily: 1 or 2 tabs. Also, she has not been taking 2000 mcg of B12 as the med list states. Does she need to start doing that? Please advise at [phone] ?

## 2021-10-31 NOTE — Telephone Encounter (Signed)
Called patient reviewed all information. She did not have any further information.  ?

## 2021-10-31 NOTE — Telephone Encounter (Signed)
Please notify patient that she needs a total of 1200 mg of calcium and 800 IUs of vitamin D daily.  This often comes in the form of 600 mg of calcium and 400 IUs of vitamin D, so if that is the case and she would take 2 tablets. ? ?If she has not been taking B12 and she can continue off.  She must have mention she was taking it at one point, that is why it is on her medication list. ? ?She is also due for follow-up in June 2023.  Please schedule ?

## 2021-11-14 ENCOUNTER — Other Ambulatory Visit: Payer: Self-pay | Admitting: Primary Care

## 2021-11-14 DIAGNOSIS — R600 Localized edema: Secondary | ICD-10-CM

## 2022-01-01 ENCOUNTER — Ambulatory Visit (INDEPENDENT_AMBULATORY_CARE_PROVIDER_SITE_OTHER): Payer: Medicare HMO

## 2022-01-01 VITALS — Ht 63.0 in | Wt 241.0 lb

## 2022-01-01 DIAGNOSIS — Z0000 Encounter for general adult medical examination without abnormal findings: Secondary | ICD-10-CM

## 2022-01-01 NOTE — Progress Notes (Addendum)
? ?Subjective:  ? [name] is a 76 y.o. female who presents for Medicare Annual (Subsequent) preventive examination. ? ?Review of Systems    ?Virtual Visit via Telephone Note ? ?I connected with  [name] on 01/08/22 at  1:00 PM EDT by telephone and verified that I am speaking with the correct person using two identifiers. ? ?Location: ?Patient: Home ?Provider: Office ?Persons participating in the virtual visit: patient/Nurse Health Advisor ?  ?I discussed the limitations, risks, security and privacy concerns of performing an evaluation and management service by telephone and the availability of in person appointments. The patient expressed understanding and agreed to proceed. ? ?Interactive audio and video telecommunications were attempted between this nurse and patient, however failed, due to patient having technical difficulties OR patient did not have access to video capability.  We continued and completed visit with audio only. ? ?Some vital signs may be absent or patient reported.  ? ?Criselda Peaches, LPN  ?Cardiac Risk Factors include: advanced age (>83men, >72 women);hypertension ? ?   ?Objective:  ?  ?Today's Vitals  ? 01/01/22 1306  ?Weight: 241 lb (109.3 kg)  ?Height: 5\' 3"  (1.6 m)  ? ?Body mass index is 42.69 kg/m?. ? ? ?  01/01/2022  ?  1:15 PM 09/28/2020  ?  2:49 PM 01/11/2020  ?  4:58 PM 01/16/2018  ? 12:22 PM 12/26/2015  ?  1:15 PM 02/11/2013  ?  9:23 AM 02/02/2013  ?  4:52 PM  ?Advanced Directives  ?Does Patient Have a Medical Advance Directive? No Yes No No No  Patient does not have advance directive;Patient would not like information  ?Type of Corporate treasurer of [place];Living will       ?Copy of [place] in Chart?  No - copy requested       ?Would patient like information on creating a medical advance directive? No - Patient declined   Yes (MAU/Ambulatory/Procedural Areas - Information given) Yes - Educational materials given    ?Pre-existing out of  facility DNR order (yellow form or pink MOST form)      No   ? ? ?Current Medications (verified) ?Outpatient Encounter Medications as of 01/01/2022  ?Medication Sig  ? aspirin 81 MG tablet Take 81 mg by mouth daily.  ? Calcium Carb-Cholecalciferol (CALCIUM 500+D PO) Take by mouth.  ? esomeprazole (NEXIUM) 20 MG capsule Take 20 mg by mouth every other day.  ? furosemide (LASIX) 20 MG tablet Take 1-2 tablets (20-40 mg total) by mouth daily. For leg swelling. Office visit required for further refills.  ? metoprolol succinate (TOPROL-XL) 50 MG 24 hr tablet TAKE 1 TABLET (50 MG TOTAL) BY MOUTH DAILY. FOR BLOOD PRESSURE.  ? ramipril (ALTACE) 5 MG capsule TAKE 1 CAPSULE (5 MG TOTAL) BY MOUTH DAILY FOR BLOOD PRESSURE.  ? vitamin B-12 (CYANOCOBALAMIN) 1000 MCG tablet Take 2,000 mcg by mouth daily.   ? ?No facility-administered encounter medications on file as of 01/01/2022.  ? ? ?Allergies (verified) ?Patient has no known allergies.  ? ?History: ?Past Medical History:  ?Diagnosis Date  ? Acute meniscal tear of left knee 01/19/2013  ? Blood transfusion without reported diagnosis   ? Depression   ? Diabetes mellitus   ? borderline  ? DJD (degenerative joint disease)   ? GERD (gastroesophageal reflux disease)   ? Hyperglycemia   ? Hypertension   ? Peripheral neuropathy   ? Restless leg   ? Syncope and collapse   ? ?  Past Surgical History:  ?Procedure Laterality Date  ? APPENDECTOMY  1976  ? CARDIAC CATHETERIZATION  2006  ? @ [name]  ? JOINT REPLACEMENT  06/2005  ? right total knee  ? KNEE ARTHROSCOPY WITH MEDIAL MENISECTOMY Left 02/11/2013  ? Procedure: LEFT KNEE ARTHROSCOPY WITH MEDIAL MENISECTOMY;  Surgeon: Tobi Bastos, MD;  Location: [facility];  Service: Orthopedics;  Laterality: Left;  ? MULTIPLE TOOTH EXTRACTIONS    ? OMENTECTOMY  07/21/2012  ? Procedure: OMENTECTOMY;  Surgeon: Odis Hollingshead, MD;  Location: [facility];  Service: General;  Laterality: N/A;  ? right knee replacement  2008  ? TONSILLECTOMY  childhood  ? TUBAL  LIGATION  1976  ? UMBILICAL HERNIA REPAIR  07/21/2012  ? Procedure: HERNIA REPAIR UMBILICAL ADULT;  Surgeon: Odis Hollingshead, MD;  Location: [facility];  Service: General;  Laterality: N/A;  ? ?Family History  ?Problem Relation Age of Onset  ? Parkinsonism Mother   ? Hypertension Father   ? Heart attack Father   ? ?Social History  ? ?Socioeconomic History  ? Marital status: Married  ?  Spouse name: Not on file  ? Number of children: 2  ? Years of education: Not on file  ? Highest education level: Not on file  ?Occupational History  ? Occupation: retired  ?Tobacco Use  ? Smoking status: Former  ?  Packs/day: 1.00  ?  Years: 20.00  ?  Pack years: 20.00  ?  Types: Cigarettes  ? Smokeless tobacco: Never  ?Vaping Use  ? Vaping Use: Never used  ?Substance and Sexual Activity  ? Alcohol use: Yes  ?  Comment: ocassionally 1 glass of wine per mth  ? Drug use: No  ? Sexual activity: Never  ?Other Topics Concern  ? Not on file  ?Social History Narrative  ? Patient would desire CPR.  ? Would not want feeding tubes or heroic measures.  ? Does not have HPOA.  ? ?Social Determinants of Health  ? ?Financial Resource Strain: Low Risk   ? Difficulty of Paying Living Expenses: Not hard at all  ?Food Insecurity: No Food Insecurity  ? Worried About Charity fundraiser in the Last Year: Never true  ? Ran Out of Food in the Last Year: Never true  ?Transportation Needs: No Transportation Needs  ? Lack of Transportation (Medical): No  ? Lack of Transportation (Non-Medical): No  ?Physical Activity: Inactive  ? Days of Exercise per Week: 0 days  ? Minutes of Exercise per Session: 0 min  ?Stress: No Stress Concern Present  ? Feeling of Stress : Not at all  ?Social Connections: Moderately Integrated  ? Frequency of Communication with Friends and Family: More than three times a week  ? Frequency of Social Gatherings with Friends and Family: More than three times a week  ? Attends Religious Services: 1 to 4 times per year  ? Active Member of Clubs  or Organizations: Yes  ? Attends Archivist Meetings: 1 to 4 times per year  ? Marital Status: Widowed  ? ? ?Clinical Intake: ? ?Diabetic?  No ? ?Activities of Daily Living ? ?  01/01/2022  ?  1:13 PM  ?In your present state of health, do you have any difficulty performing the following activities:  ?Hearing? 0  ?Vision? 0  ?Difficulty concentrating or making decisions? 0  ?Walking or climbing stairs? 0  ?Dressing or bathing? 0  ?Doing errands, shopping? 0  ?Preparing Food and eating ? N  ?Using  the Toilet? N  ?In the past six months, have you accidently leaked urine? N  ?Do you have problems with loss of bowel control? N  ?Managing your Medications? N  ?Managing your Finances? N  ?Housekeeping or managing your Housekeeping? N  ? ? ?Patient Care Team: ?Pleas Koch, NP as PCP - General (Internal Medicine) ?Latanya Maudlin, MD as Consulting Physician (Orthopedic Surgery) ?Jackolyn Confer, MD as Consulting Physician (General Surgery) ? ?Indicate any recent Medical Services you may have received from other than [facility] providers in the past year (date may be approximate). ? ?   ?Assessment:  ? This is a routine wellness examination for [place]. ? ?Hearing/Vision screen ?Hearing Screening - Comments:: No hearing difficulty ?Vision Screening - Comments:: Wears glasses. Followed by Dr Rick Duff ? ?Dietary issues and exercise activities discussed: ?Exercise limited by: None identified ? ? Goals Addressed   ? ?  ?  ?  ?  ?  ? This Visit's Progress  ?   Patient Stated (pt-stated)     ?    I will maintain and continue medications as prescribed.  ?  ? ?  ? ?Depression Screen ? ?  01/01/2022  ?  1:10 PM 09/28/2020  ?  2:54 PM 01/16/2018  ? 12:24 PM 12/27/2016  ? 12:13 PM 12/26/2015  ?  1:41 PM 09/23/2014  ?  9:28 AM 09/23/2014  ?  9:22 AM  ?PHQ 2/9 Scores  ?PHQ - 2 Score 0 0 1 6 6  0 0  ?PHQ- 9 Score  0 4 22 12     ?  ?Fall Risk ? ?  01/01/2022  ?  1:14 PM 02/14/2021  ? 12:08 PM 09/28/2020  ?  2:50 PM 01/16/2018  ? 12:24 PM 12/27/2016   ? 12:13 PM  ?Fall Risk   ?Falls in the past year? 0 0 0 No No  ?Number falls in past yr: 0 0 0    ?Injury with Fall? 0 0 0    ?Risk for fall due to : No Fall Risks Impaired balance/gait Medication side

## 2022-01-01 NOTE — Patient Instructions (Addendum)
?[name] , ?Thank you for taking time to come for your Medicare Wellness Visit. I appreciate your ongoing commitment to your health goals. Please review the following plan we discussed and let me know if I can assist you in the future.  ? ?These are the goals we discussed: ? Goals   ? ?   Increase physical activity   ?   Starting 01/16/2018, I will attempt to exercise at [facility] for at least 60 min 1-2 days per week.  ? ?  ?   Patient Stated (pt-stated)   ?    I will maintain and continue medications as prescribed.  ?  ? ?  ?  ?This is a list of the screening recommended for you and due dates:  ?Health Maintenance  ?Topic Date Due  ? Mammogram  10/22/2015  ? Complete foot exam   08/04/2020  ? COVID-19 Vaccine (4 - Booster for Pfizer series) 04/06/2021  ? Hemoglobin A1C  08/16/2021  ? Eye exam for diabetics  09/27/2021  ? Zoster (Shingles) Vaccine (1 of 2) 04/03/2022*  ? Tetanus Vaccine  09/29/2023*  ? Flu Shot  03/27/2022  ? Pneumonia Vaccine  Completed  ? DEXA scan (bone density measurement)  Completed  ? Hepatitis C Screening: USPSTF Recommendation to screen - Ages 39-79 yo.  Completed  ? HPV Vaccine  Aged Out  ? Cologuard (Stool DNA test)  Discontinued  ?*Topic was postponed. The date shown is not the original due date.  ? ?Advanced directives: No Pending process. ? ?Conditions/risks identified: None ? ?Next appointment: Follow up in one year for your annual wellness visit  ? ? ?Preventive Care 23 Years and Older, Female ?Preventive care refers to lifestyle choices and visits with your health care provider that can promote health and wellness. ?What does preventive care include? ?A yearly physical exam. This is also called an annual well check. ?Dental exams once or twice a year. ?Routine eye exams. Ask your health care provider how often you should have your eyes checked. ?Personal lifestyle choices, including: ?Daily care of your teeth and gums. ?Regular physical activity. ?Eating a healthy diet. ?Avoiding  tobacco and drug use. ?Limiting alcohol use. ?Practicing safe sex. ?Taking low-dose aspirin every day. ?Taking vitamin and mineral supplements as recommended by your health care provider. ?What happens during an annual well check? ?The services and screenings done by your health care provider during your annual well check will depend on your age, overall health, lifestyle risk factors, and family history of disease. ?Counseling  ?Your health care provider may ask you questions about your: ?Alcohol use. ?Tobacco use. ?Drug use. ?Emotional well-being. ?Home and relationship well-being. ?Sexual activity. ?Eating habits. ?History of falls. ?Memory and ability to understand (cognition). ?Work and work Statistician. ?Reproductive health. ?Screening  ?You may have the following tests or measurements: ?Height, weight, and BMI. ?Blood pressure. ?Lipid and cholesterol levels. These may be checked every 5 years, or more frequently if you are over 21 years old. ?Skin check. ?Lung cancer screening. You may have this screening every year starting at age 39 if you have a 30-pack-year history of smoking and currently smoke or have quit within the past 15 years. ?Fecal occult blood test (FOBT) of the stool. You may have this test every year starting at age 4. ?Flexible sigmoidoscopy or colonoscopy. You may have a sigmoidoscopy every 5 years or a colonoscopy every 10 years starting at age 14. ?Hepatitis C blood test. ?Hepatitis B blood test. ?Sexually transmitted disease (STD) testing. ?  Diabetes screening. This is done by checking your blood sugar (glucose) after you have not eaten for a while (fasting). You may have this done every 1-3 years. ?Bone density scan. This is done to screen for osteoporosis. You may have this done starting at age 52. ?Mammogram. This may be done every 1-2 years. Talk to your health care provider about how often you should have regular mammograms. ?Talk with your health care provider about your test  results, treatment options, and if necessary, the need for more tests. ?Vaccines  ?Your health care provider may recommend certain vaccines, such as: ?Influenza vaccine. This is recommended every year. ?Tetanus, diphtheria, and acellular pertussis (Tdap, Td) vaccine. You may need a Td booster every 10 years. ?Zoster vaccine. You may need this after age 69. ?Pneumococcal 13-valent conjugate (PCV13) vaccine. One dose is recommended after age 41. ?Pneumococcal polysaccharide (PPSV23) vaccine. One dose is recommended after age 45. ?Talk to your health care provider about which screenings and vaccines you need and how often you need them. ?This information is not intended to replace advice given to you by your health care provider. Make sure you discuss any questions you have with your health care provider. ?Document Released: 09/09/2015 Document Revised: 05/02/2016 Document Reviewed: 06/14/2015 ?Elsevier Interactive Patient Education ? 2017 [place]. ? ?Fall Prevention in the Home ?Falls can cause injuries. They can happen to people of all ages. There are many things you can do to make your home safe and to help prevent falls. ?What can I do on the outside of my home? ?Regularly fix the edges of walkways and driveways and fix any cracks. ?Remove anything that might make you trip as you walk through a door, such as a raised step or threshold. ?Trim any bushes or trees on the path to your home. ?Use bright outdoor lighting. ?Clear any walking paths of anything that might make someone trip, such as rocks or tools. ?Regularly check to see if handrails are loose or broken. Make sure that both sides of any steps have handrails. ?Any raised decks and porches should have guardrails on the edges. ?Have any leaves, snow, or ice cleared regularly. ?Use sand or salt on walking paths during winter. ?Clean up any spills in your garage right away. This includes oil or grease spills. ?What can I do in the bathroom? ?Use night  lights. ?Install grab bars by the toilet and in the tub and shower. Do not use towel bars as grab bars. ?Use non-skid mats or decals in the tub or shower. ?If you need to sit down in the shower, use a plastic, non-slip stool. ?Keep the floor dry. Clean up any water that spills on the floor as soon as it happens. ?Remove soap buildup in the tub or shower regularly. ?Attach bath mats securely with double-sided non-slip rug tape. ?Do not have throw rugs and other things on the floor that can make you trip. ?What can I do in the bedroom? ?Use night lights. ?Make sure that you have a light by your bed that is easy to reach. ?Do not use any sheets or blankets that are too big for your bed. They should not hang down onto the floor. ?Have a firm chair that has side arms. You can use this for support while you get dressed. ?Do not have throw rugs and other things on the floor that can make you trip. ?What can I do in the kitchen? ?Clean up any spills right away. ?Avoid walking on wet floors. ?  Keep items that you use a lot in easy-to-reach places. ?If you need to reach something above you, use a strong step stool that has a grab bar. ?Keep electrical cords out of the way. ?Do not use floor polish or wax that makes floors slippery. If you must use wax, use non-skid floor wax. ?Do not have throw rugs and other things on the floor that can make you trip. ?What can I do with my stairs? ?Do not leave any items on the stairs. ?Make sure that there are handrails on both sides of the stairs and use them. Fix handrails that are broken or loose. Make sure that handrails are as long as the stairways. ?Check any carpeting to make sure that it is firmly attached to the stairs. Fix any carpet that is loose or worn. ?Avoid having throw rugs at the top or bottom of the stairs. If you do have throw rugs, attach them to the floor with carpet tape. ?Make sure that you have a light switch at the top of the stairs and the bottom of the stairs. If  you do not have them, ask someone to add them for you. ?What else can I do to help prevent falls? ?Wear shoes that: ?Do not have high heels. ?Have rubber bottoms. ?Are comfortable and fit you well. ?Are closed at t

## 2022-01-30 ENCOUNTER — Other Ambulatory Visit: Payer: Self-pay | Admitting: Primary Care

## 2022-01-30 DIAGNOSIS — I10 Essential (primary) hypertension: Secondary | ICD-10-CM

## 2022-01-31 NOTE — Telephone Encounter (Signed)
Patient is due for CPE/follow up, this will be required prior to any further refills.  Please schedule.   

## 2022-01-31 NOTE — Telephone Encounter (Signed)
Cpe scheudled for 6/8 @ 2 pm

## 2022-02-01 ENCOUNTER — Ambulatory Visit (INDEPENDENT_AMBULATORY_CARE_PROVIDER_SITE_OTHER): Payer: Medicare HMO | Admitting: Primary Care

## 2022-02-01 VITALS — BP 150/94 | HR 85 | Temp 97.1°F | Resp 16 | Ht 62.5 in | Wt 245.1 lb

## 2022-02-01 DIAGNOSIS — I10 Essential (primary) hypertension: Secondary | ICD-10-CM

## 2022-02-01 DIAGNOSIS — M8580 Other specified disorders of bone density and structure, unspecified site: Secondary | ICD-10-CM

## 2022-02-01 DIAGNOSIS — R600 Localized edema: Secondary | ICD-10-CM | POA: Diagnosis not present

## 2022-02-01 DIAGNOSIS — Z8639 Personal history of other endocrine, nutritional and metabolic disease: Secondary | ICD-10-CM

## 2022-02-01 DIAGNOSIS — R0609 Other forms of dyspnea: Secondary | ICD-10-CM | POA: Diagnosis not present

## 2022-02-01 DIAGNOSIS — E2839 Other primary ovarian failure: Secondary | ICD-10-CM

## 2022-02-01 DIAGNOSIS — F32A Depression, unspecified: Secondary | ICD-10-CM

## 2022-02-01 DIAGNOSIS — Z0001 Encounter for general adult medical examination with abnormal findings: Secondary | ICD-10-CM | POA: Diagnosis not present

## 2022-02-01 DIAGNOSIS — K219 Gastro-esophageal reflux disease without esophagitis: Secondary | ICD-10-CM

## 2022-02-01 LAB — COMPREHENSIVE METABOLIC PANEL
ALT: 19 U/L (ref 0–35)
AST: 19 U/L (ref 0–37)
Albumin: 4.3 g/dL (ref 3.5–5.2)
Alkaline Phosphatase: 82 U/L (ref 39–117)
BUN: 13 mg/dL (ref 6–23)
CO2: 27 mEq/L (ref 19–32)
Calcium: 9.6 mg/dL (ref 8.4–10.5)
Chloride: 105 mEq/L (ref 96–112)
Creatinine, Ser: 0.77 mg/dL (ref 0.40–1.20)
GFR: 74.28 mL/min (ref >60.00)
Glucose, Bld: 95 mg/dL (ref 70–99)
Potassium: 4 mEq/L (ref 3.5–5.1)
Sodium: 140 mEq/L (ref 135–145)
Total Bilirubin: 0.8 mg/dL (ref 0.2–1.2)
Total Protein: 6.6 g/dL (ref 6.0–8.3)

## 2022-02-01 LAB — LIPID PANEL
Cholesterol: 132 mg/dL (ref 0–200)
HDL: 55.2 mg/dL (ref >39.00)
LDL Cholesterol: 50 mg/dL (ref 0–99)
NonHDL: 77.01
Total CHOL/HDL Ratio: 2
Triglycerides: 133 mg/dL (ref 0.0–149.0)
VLDL: 26.6 mg/dL (ref 0.0–40.0)

## 2022-02-01 LAB — CBC
HCT: 43.2 % (ref 36.0–46.0)
Hemoglobin: 14.1 g/dL (ref 12.0–15.0)
MCHC: 32.6 g/dL (ref 30.0–36.0)
MCV: 88.7 fl (ref 78.0–100.0)
Platelets: 208 10*3/uL (ref 150.0–400.0)
RBC: 4.88 Mil/uL (ref 3.87–5.11)
RDW: 13.7 % (ref 11.5–15.5)
WBC: 7.8 10*3/uL (ref 4.0–10.5)

## 2022-02-01 LAB — BRAIN NATRIURETIC PEPTIDE: Pro B Natriuretic peptide (BNP): 226 pg/mL — ABNORMAL HIGH (ref 0.0–100.0)

## 2022-02-01 LAB — HEMOGLOBIN A1C: Hgb A1c MFr Bld: 5.4 % (ref 4.6–6.5)

## 2022-02-01 NOTE — Assessment & Plan Note (Signed)
Controlled.  Continue Nexium 20 mg every other day.

## 2022-02-01 NOTE — Patient Instructions (Addendum)
Stop by the lab prior to leaving today. I will notify you of your results once received.   You will be contacted regarding your referral to cardiology.  Please let us know if you have not been contacted within two weeks.   Your bone density scan is due in December 2023. Please call the [facility] to schedule this.  It was a pleasure to see you today!  Preventive Care 46 Years and Older, Female Preventive care refers to lifestyle choices and visits with your health care provider that can promote health and wellness. Preventive care visits are also called wellness exams. What can I expect for my preventive care visit? Counseling Your health care provider may ask you questions about your: Medical history, including: Past medical problems. Family medical history. Pregnancy and menstrual history. History of falls. Current health, including: Memory and ability to understand (cognition). Emotional well-being. Home life and relationship well-being. Sexual activity and sexual health. Lifestyle, including: Alcohol, nicotine or tobacco, and drug use. Access to firearms. Diet, exercise, and sleep habits. Work and work Statistician. Sunscreen use. Safety issues such as seatbelt and bike helmet use. Physical exam Your health care provider will check your: Height and weight. These may be used to calculate your BMI (body mass index). BMI is a measurement that tells if you are at a healthy weight. Waist circumference. This measures the distance around your waistline. This measurement also tells if you are at a healthy weight and may help predict your risk of certain diseases, such as type 2 diabetes and high blood pressure. Heart rate and blood pressure. Body temperature. Skin for abnormal spots. What immunizations do I need?  Vaccines are usually given at various ages, according to a schedule. Your health care provider will recommend vaccines for you based on your age, medical  history, and lifestyle or other factors, such as travel or where you work. What tests do I need? Screening Your health care provider may recommend screening tests for certain conditions. This may include: Lipid and cholesterol levels. Hepatitis C test. Hepatitis B test. HIV (human immunodeficiency virus) test. STI (sexually transmitted infection) testing, if you are at risk. Lung cancer screening. Colorectal cancer screening. Diabetes screening. This is done by checking your blood sugar (glucose) after you have not eaten for a while (fasting). Mammogram. Talk with your health care provider about how often you should have regular mammograms. BRCA-related cancer screening. This may be done if you have a family history of breast, ovarian, tubal, or peritoneal cancers. Bone density scan. This is done to screen for osteoporosis. Talk with your health care provider about your test results, treatment options, and if necessary, the need for more tests. Follow these instructions at home: Eating and drinking  Eat a diet that includes fresh fruits and vegetables, whole grains, lean protein, and low-fat dairy products. Limit your intake of foods with high amounts of sugar, saturated fats, and salt. Take vitamin and mineral supplements as recommended by your health care provider. Do not drink alcohol if your health care provider tells you not to drink. If you drink alcohol: Limit how much you have to 0-1 drink a day. Know how much alcohol is in your drink. In the [place] one drink equals one 12 oz bottle of beer (355 mL), one 5 oz glass of wine (148 mL), or one 1 oz glass of hard liquor (44 mL). Lifestyle Brush your teeth every morning and night with fluoride toothpaste. Floss one time each day. Exercise for at  least 30 minutes 5 or more days each week. Do not use any products that contain nicotine or tobacco. These products include cigarettes, chewing tobacco, and vaping devices, such as e-cigarettes.  If you need help quitting, ask your health care provider. Do not use drugs. If you are sexually active, practice safe sex. Use a condom or other form of protection in order to prevent STIs. Take aspirin only as told by your health care provider. Make sure that you understand how much to take and what form to take. Work with your health care provider to find out whether it is safe and beneficial for you to take aspirin daily. Ask your health care provider if you need to take a cholesterol-lowering medicine (statin). Find healthy ways to manage stress, such as: Meditation, yoga, or listening to music. Journaling. Talking to a trusted person. Spending time with friends and family. Minimize exposure to UV radiation to reduce your risk of skin cancer. Safety Always wear your seat belt while driving or riding in a vehicle. Do not drive: If you have been drinking alcohol. Do not ride with someone who has been drinking. When you are tired or distracted. While texting. If you have been using any mind-altering substances or drugs. Wear a helmet and other protective equipment during sports activities. If you have firearms in your house, make sure you follow all gun safety procedures. What's next? Visit your health care provider once a year for an annual wellness visit. Ask your health care provider how often you should have your eyes and teeth checked. Stay up to date on all vaccines. This information is not intended to replace advice given to you by your health care provider. Make sure you discuss any questions you have with your health care provider. Document Revised: 02/08/2021 Document Reviewed: 02/08/2021 Elsevier Patient Education  [place].

## 2022-02-01 NOTE — Assessment & Plan Note (Addendum)
Chronic and continued.  Appearance today is more representative of lymphedema, discussed this with patient today. She does have a history of elevated BNP. Rechecking today.  Continue furosemide 20 mg once daily.  Will be referring to cardiology.

## 2022-02-01 NOTE — Assessment & Plan Note (Signed)
Above goal today, also on recheck. She did not take meds today.  Discussed to resume all meds, start monitoring BP at home. She will call if BP remains at or above 140/90.    Continue ramipril 5 mg daily and metoprolol succinate 50 mg daily for now.  Labs pending.

## 2022-02-01 NOTE — Assessment & Plan Note (Addendum)
Continued symptoms, now with chest tightness on exertion.  Differentials include atrial fibrillation, CAD, morbid obesity, CHF, COPD from prior smoking history - although she quit in early 1980's.  Strongly advised cardiology evaluation, last year she declined, today she agrees.  ECG today with NSR with rate of 66, left axis, no acute ST changes, no PAC/PVC. Appears similar to ECG from 2020.   Labs pending today including CMP, BNP, CBC. Chest xray reviewed from June 2022, echocardiogram reviewed from 2020. Referral placed for cardiology evaluation.

## 2022-02-01 NOTE — Assessment & Plan Note (Signed)
Immunizations UTD.  Declines mammogram and colon cancer screening given age. Bone density scan due in December 2023, discussed this with patient.   Discussed the importance of a healthy diet and regular exercise in order for weight loss, and to reduce the risk of further co-morbidity.  Exam as noted. Labs pending.  Follow up in 1 year for repeat physical.

## 2022-02-01 NOTE — Assessment & Plan Note (Signed)
Bone density scan due in December 2023. Continue calcium and vitamin D.

## 2022-02-01 NOTE — Assessment & Plan Note (Signed)
Denies concerns today. °Continue to monitor. °

## 2022-02-01 NOTE — Progress Notes (Signed)
Subjective:    Patient ID: [name], female    DOB: Mar 04, 1946, 76 y.o.   MRN: [id number]  HPI  [name] is a very pleasant 76 y.o. female who presents today for complete physical and follow up of chronic conditions.  She would also like to mention symptoms of low energy, shortness of breath, mid and left chest pain, left sided neck and shoulder pain. She experiences fatigue, chest tightness, and shortness of breath upon mild exertion, has to sit and rest. She did not take her ramipril today. Symptoms began about 1 year ago and have progressed since. She attributes her symptoms to the Covid-19 vaccine as symptoms began shortly after.   She is a prior smoker, quit smoking in the early 1980's.   Wt Readings from Last 3 Encounters:  02/01/22 245 lb 1 oz (111.2 kg)  01/01/22 241 lb (109.3 kg)  02/14/21 241 lb (109.3 kg)     Immunizations: -Influenza: Did not complete last season -Covid-19: 3 vaccines -Shingles: Completed Zostavax -Pneumonia: Prevnar 13 in 2016, Pneumovax 23 in 2013  Diet: Fair diet.  Exercise: No regular exercise.  Eye exam: Completes annually  Dental exam: Completes semi-annually   Mammogram: Completed in 2016, declines today.  Colonoscopy: Completed Cologuard in 2018, declines further screening given age.  Dexa: Completed in December 2021, osteopenia  She does check her BP at home.    BP Readings from Last 3 Encounters:  02/01/22 (!) 150/94  02/14/21 140/82  02/12/20 134/78      Review of Systems  Constitutional:  Positive for fatigue. Negative for unexpected weight change.  HENT:  Negative for rhinorrhea.   Respiratory:  Positive for shortness of breath. Negative for cough.   Cardiovascular:  Positive for leg swelling. Negative for chest pain.  Gastrointestinal:  Negative for constipation and diarrhea.  Genitourinary:  Negative for difficulty urinating.  Musculoskeletal:  Positive for arthralgias.  Skin:  Negative for rash.   Allergic/Immunologic: Negative for environmental allergies.  Neurological:  Negative for dizziness and headaches.  Psychiatric/Behavioral:  The patient is not nervous/anxious.          Past Medical History:  Diagnosis Date   Acute meniscal tear of left knee 01/19/2013   Blood transfusion without reported diagnosis    Depression    Diabetes mellitus    borderline   DJD (degenerative joint disease)    GERD (gastroesophageal reflux disease)    Hyperglycemia    Hypertension    Peripheral neuropathy    Restless leg    Syncope and collapse     Social History   Socioeconomic History   Marital status: Married    Spouse name: Not on file   Number of children: 2   Years of education: Not on file   Highest education level: Not on file  Occupational History   Occupation: retired  Tobacco Use   Smoking status: Former    Packs/day: 1.00    Years: 20.00    Total pack years: 20.00    Types: Cigarettes   Smokeless tobacco: Never  Vaping Use   Vaping Use: Never used  Substance and Sexual Activity   Alcohol use: Yes    Comment: ocassionally 1 glass of wine per mth   Drug use: No   Sexual activity: Never  Other Topics Concern   Not on file  Social History Narrative   Patient would desire CPR.   Would not want feeding tubes or heroic measures.   Does not have HPOA.  Social Determinants of Health   Financial Resource Strain: Low Risk  (01/01/2022)   Overall Financial Resource Strain (CARDIA)    Difficulty of Paying Living Expenses: Not hard at all  Food Insecurity: No Food Insecurity (01/01/2022)   Hunger Vital Sign    Worried About Running Out of Food in the Last Year: Never true    Ran Out of Food in the Last Year: Never true  Transportation Needs: No Transportation Needs (01/01/2022)   PRAPARE - Administrator, Civil Service (Medical): No    Lack of Transportation (Non-Medical): No  Physical Activity: Inactive (01/01/2022)   Exercise Vital Sign    Days of  Exercise per Week: 0 days    Minutes of Exercise per Session: 0 min  Stress: No Stress Concern Present (01/01/2022)   [employer] of Occupational Health - Occupational Stress Questionnaire    Feeling of Stress : Not at all  Social Connections: Moderately Integrated (01/01/2022)   Social Connection and Isolation Panel [NHANES]    Frequency of Communication with Friends and Family: More than three times a week    Frequency of Social Gatherings with Friends and Family: More than three times a week    Attends Religious Services: 1 to 4 times per year    Active Member of [employer] or Organizations: Yes    Attends Banker Meetings: 1 to 4 times per year    Marital Status: Widowed  Intimate Partner Violence: Not At Risk (01/01/2022)   Humiliation, Afraid, Rape, and Kick questionnaire    Fear of Current or Ex-Partner: No    Emotionally Abused: No    Physically Abused: No    Sexually Abused: No    Past Surgical History:  Procedure Laterality Date   APPENDECTOMY  1976   CARDIAC CATHETERIZATION  2006   @ [facility]   JOINT REPLACEMENT  06/2005   right total knee   KNEE ARTHROSCOPY WITH MEDIAL MENISECTOMY Left 02/11/2013   Procedure: LEFT KNEE ARTHROSCOPY WITH MEDIAL MENISECTOMY;  Surgeon: Jacki Cones, MD;  Location: [facility];  Service: Orthopedics;  Laterality: Left;   MULTIPLE TOOTH EXTRACTIONS     OMENTECTOMY  07/21/2012   Procedure: OMENTECTOMY;  Surgeon: Adolph Pollack, MD;  Location: [facility];  Service: General;  Laterality: N/A;   right knee replacement  2008   TONSILLECTOMY  childhood   TUBAL LIGATION  1976   UMBILICAL HERNIA REPAIR  07/21/2012   Procedure: HERNIA REPAIR UMBILICAL ADULT;  Surgeon: Adolph Pollack, MD;  Location: [facility];  Service: General;  Laterality: N/A;    Family History  Problem Relation Age of Onset   Parkinsonism Mother    Hypertension Father    Heart attack Father     No Known Allergies  Current Outpatient Medications on File Prior to  Visit  Medication Sig Dispense Refill   aspirin 81 MG tablet Take 81 mg by mouth daily.     Calcium Carb-Cholecalciferol (CALCIUM 600 + D PO) Take by mouth. 2 daily     esomeprazole (NEXIUM) 20 MG capsule Take 20 mg by mouth every other day.     furosemide (LASIX) 20 MG tablet Take 1-2 tablets (20-40 mg total) by mouth daily. For leg swelling. Office visit required for further refills. 180 tablet 0   metoprolol succinate (TOPROL-XL) 50 MG 24 hr tablet Take 1 tablet (50 mg total) by mouth daily. For blood pressure. Office visit required for further refills. 30 tablet 0   ramipril (ALTACE)  5 MG capsule TAKE 1 CAPSULE (5 MG TOTAL) BY MOUTH DAILY FOR BLOOD PRESSURE. 90 capsule 3   vitamin B-12 (CYANOCOBALAMIN) 1000 MCG tablet Take 1,000 mcg by mouth daily.     No current facility-administered medications on file prior to visit.    BP (!) 150/94   Pulse 85   Temp (!) 97.1 F (36.2 C)   Resp 16   Ht 5' 2.5" (1.588 m)   Wt 245 lb 1 oz (111.2 kg)   SpO2 99%   BMI 44.11 kg/m  Objective:   Physical Exam HENT:     Right Ear: Tympanic membrane and ear canal normal.     Left Ear: Tympanic membrane and ear canal normal.     Nose: Nose normal.  Eyes:     Conjunctiva/sclera: Conjunctivae normal.     Pupils: Pupils are equal, round, and reactive to light.  Neck:     Thyroid: No thyromegaly.  Cardiovascular:     Rate and Rhythm: Normal rate. Rhythm irregular.     Heart sounds: No murmur heard. Pulmonary:     Effort: Pulmonary effort is normal.     Breath sounds: Normal breath sounds. No rales.  Abdominal:     General: Bowel sounds are normal.     Palpations: Abdomen is soft.     Tenderness: There is no abdominal tenderness.  Musculoskeletal:     Cervical back: Neck supple.     Comments: Generalized decrease in ROM.  Unable to step up and sit on the exam table.   Lymphadenopathy:     Cervical: No cervical adenopathy.  Skin:    General: Skin is warm and dry.     Findings: No rash.   Neurological:     Mental Status: She is alert and oriented to person, place, and time.     Cranial Nerves: No cranial nerve deficit.     Deep Tendon Reflexes: Reflexes are normal and symmetric.  Psychiatric:        Mood and Affect: Mood normal.           Assessment & Plan:   Problem List Items Addressed This Visit       Cardiovascular and Mediastinum   Hypertension    Above goal today, also on recheck. She did not take meds today.  Discussed to resume all meds, start monitoring BP at home. She will call if BP remains at or above 140/90.    Continue ramipril 5 mg daily and metoprolol succinate 50 mg daily for now.  Labs pending.          Digestive   GERD (gastroesophageal reflux disease)    Controlled.  Continue Nexium 20 mg every other day.         Musculoskeletal and Integument   Osteopenia    Bone density scan due in December 2023. Continue calcium and vitamin D.        Other   Depression    Denies concerns today. Continue to monitor.       History of diabetes mellitus, type II    No longer on treatment. Continue off for now.  Repeat A1C pending.      Relevant Orders   Hemoglobin A1c   Encounter for annual general medical examination with abnormal findings in adult - Primary    Immunizations UTD.  Declines mammogram and colon cancer screening given age. Bone density scan due in December 2023, discussed this with patient.   Discussed the importance of a healthy diet and  regular exercise in order for weight loss, and to reduce the risk of further co-morbidity.  Exam as noted. Labs pending.  Follow up in 1 year for repeat physical.       Lower extremity edema    Chronic and continued.  Appearance today is more representative of lymphedema, discussed this with patient today. She does have a history of elevated BNP. Rechecking today.  Continue furosemide 20 mg once daily.  Will be referring to cardiology.      Exertional  dyspnea    Continued symptoms, now with chest tightness on exertion.  Differentials include atrial fibrillation, CAD, morbid obesity, CHF, COPD from prior smoking history - although she quit in early 1980's.  Strongly advised cardiology evaluation, last year she declined, today she agrees.  ECG today with NSR with rate of 66, left axis, no acute ST changes, no PAC/PVC. Appears similar to ECG from 2020.   Labs pending today including CMP, BNP, CBC. Chest xray reviewed from June 2022, echocardiogram reviewed from 2020. Referral placed for cardiology evaluation.       Relevant Orders   Brain natriuretic peptide   CBC   EKG 12-Lead (Completed)   Ambulatory referral to Cardiology   Other Visit Diagnoses     Essential hypertension       Relevant Orders   Lipid panel   Comprehensive metabolic panel   Estrogen deficiency       Relevant Orders   DG Bone Density          Doreene Nest, NP

## 2022-02-01 NOTE — Assessment & Plan Note (Signed)
No longer on treatment. Continue off for now.  Repeat A1C pending.

## 2022-02-14 ENCOUNTER — Ambulatory Visit: Payer: Medicare HMO | Admitting: Cardiovascular Disease

## 2022-02-24 ENCOUNTER — Other Ambulatory Visit: Payer: Self-pay | Admitting: Primary Care

## 2022-02-24 DIAGNOSIS — I10 Essential (primary) hypertension: Secondary | ICD-10-CM

## 2022-03-15 ENCOUNTER — Other Ambulatory Visit: Payer: Self-pay | Admitting: Primary Care

## 2022-03-15 DIAGNOSIS — I10 Essential (primary) hypertension: Secondary | ICD-10-CM

## 2022-03-27 ENCOUNTER — Ambulatory Visit (INDEPENDENT_AMBULATORY_CARE_PROVIDER_SITE_OTHER): Payer: Medicare HMO | Admitting: Cardiovascular Disease

## 2022-03-27 ENCOUNTER — Other Ambulatory Visit
Admission: RE | Admit: 2022-03-27 | Discharge: 2022-03-27 | Disposition: A | Discharge status: Home / Self Care | Payer: Medicare HMO | Source: Ambulatory Visit | Attending: Cardiovascular Disease | Admitting: Cardiovascular Disease

## 2022-03-27 ENCOUNTER — Encounter: Payer: Self-pay | Admitting: Cardiovascular Disease

## 2022-03-27 VITALS — BP 186/72 | HR 68 | Ht 63.0 in | Wt 240.4 lb

## 2022-03-27 DIAGNOSIS — I10 Essential (primary) hypertension: Secondary | ICD-10-CM | POA: Diagnosis not present

## 2022-03-27 DIAGNOSIS — I209 Angina pectoris, unspecified: Secondary | ICD-10-CM

## 2022-03-27 DIAGNOSIS — I5032 Chronic diastolic (congestive) heart failure: Secondary | ICD-10-CM

## 2022-03-27 LAB — BASIC METABOLIC PANEL
Anion gap: 9 (ref 5–15)
BUN: 14 mg/dL (ref 8–23)
CO2: 25 mmol/L (ref 22–32)
Calcium: 10 mg/dL (ref 8.9–10.3)
Chloride: 107 mmol/L (ref 98–111)
Creatinine, Ser: 0.78 mg/dL (ref 0.44–1.00)
GFR, Estimated: >60 mL/min (ref >60)
Glucose, Bld: 108 mg/dL — ABNORMAL HIGH (ref 70–99)
Potassium: 3.7 mmol/L (ref 3.5–5.1)
Sodium: 141 mmol/L (ref 135–145)

## 2022-03-27 LAB — CBC WITH DIFFERENTIAL/PLATELET
Abs Immature Granulocytes: 0.04 10*3/uL (ref 0.00–0.07)
Basophils Absolute: 0.1 10*3/uL (ref 0.0–0.1)
Basophils Relative: 1 %
Eosinophils Absolute: 0.3 10*3/uL (ref 0.0–0.5)
Eosinophils Relative: 3 %
HCT: 43.9 % (ref 36.0–46.0)
Hemoglobin: 14.4 g/dL (ref 12.0–15.0)
Immature Granulocytes: 0 %
Lymphocytes Relative: 28 %
Lymphs Abs: 2.7 10*3/uL (ref 0.7–4.0)
MCH: 29 pg (ref 26.0–34.0)
MCHC: 32.8 g/dL (ref 30.0–36.0)
MCV: 88.5 fL (ref 80.0–100.0)
Monocytes Absolute: 0.8 10*3/uL (ref 0.1–1.0)
Monocytes Relative: 8 %
Neutro Abs: 5.8 10*3/uL (ref 1.7–7.7)
Neutrophils Relative %: 60 %
Platelets: 242 10*3/uL (ref 150–400)
RBC: 4.96 MIL/uL (ref 3.87–5.11)
RDW: 13.2 % (ref 11.5–15.5)
WBC: 9.6 10*3/uL (ref 4.0–10.5)
nRBC: 0 % (ref 0.0–0.2)

## 2022-03-27 MED ORDER — SODIUM CHLORIDE 0.9% FLUSH: 3.0000 mL | Freq: Two times a day (BID) | INTRAVENOUS | Status: DC

## 2022-03-27 MED ORDER — CARVEDILOL 12.5 MG PO TABS: 12.5000 mg | ORAL_TABLET | Freq: Two times a day (BID) | ORAL | 5 refills | Status: DC

## 2022-03-27 NOTE — Patient Instructions (Addendum)
Medication Instructions:  Your physician has recommended you make the following change in your medication:   STOP Metoprolol  START Carvedilol 12.5 mg twice a day. An Rx has been sent to your pharmacy  *If you need a refill on your cardiac medications before your next appointment, please call your pharmacy*   Lab Work: Bmp and Cbc today.  Please have your labs drawn at the [facility]. Stop at the Registration desk to check in  Please have  If you have labs (blood work) drawn today and your tests are completely normal, you will receive your results only by: MyChart Message (if you have MyChart) OR A paper copy in the mail If you have any lab test that is abnormal or we need to change your treatment, we will call you to review the results.   Testing/Procedures: Your physician has requested that you have a cardiac catheterization. Cardiac catheterization is used to diagnose and/or treat various heart conditions. Doctors may recommend this procedure for a number of different reasons. The most common reason is to evaluate chest pain. Chest pain can be a symptom of coronary artery disease (CAD), and cardiac catheterization can show whether plaque is narrowing or blocking your heart's arteries. This procedure is also used to evaluate the valves, as well as measure the blood flow and oxygen levels in different parts of your heart. For further information please visit [email or web address] Please follow instruction sheet, as given.    Follow-Up: At [facility], you and your health needs are our priority.  As part of our continuing mission to provide you with exceptional heart care, we have created designated Provider Care Teams.  These Care Teams include your primary Cardiologist (physician) and Advanced Practice Providers (APPs -  Physician Assistants and Nurse Practitioners) who all work together to provide you with the care you need, when you need it.  We recommend signing up for the  patient portal called "MyChart".  Sign up information is provided on this After Visit Summary.  MyChart is used to connect with patients for Virtual Visits (Telemedicine).  Patients are able to view lab/test results, encounter notes, upcoming appointments, etc.  Non-urgent messages can be sent to your provider as well.   To learn more about what you can do with MyChart, go to [email or web address].    Your next appointment:   4 week(s)  The format for your next appointment:   In Person  Provider:   You may see [name], MD or one of the following Advanced Practice Providers on your designated Care Team:   Nicolasa Ducking, NP Eula Listen, PA-C Cadence Fransico Michael, [place]   Other Instructions  [facility] CARDIOVASCULAR DIVISION [facility] [address] [place] [address] Dept: [phone] Loc: [phone]  LEETA GRIMME  03/27/2022  You are scheduled for a Cardiac Catheterization on Wednesday, August 9 with Dr. [name].  1. Please arrive at the [facility] [address] [place], [place] [address] at 7:30 AM (This time is one hour before your procedure to ensure your preparation). Free valet parking service is available.   Special note: Every effort is made to have your procedure done on time. Please understand that emergencies sometimes delay scheduled procedures.  2. Diet: Do not eat solid foods after midnight.  You may have clear liquids until 5 AM upon the day of the procedure.  3. Labs: You will need to have blood drawn on Tuesday, August 1 at  [facility] Entr[facility], Go to 1st desk on your right to register.  Address: [address] [place], [place] [address]  Open: 8am - 5pm  Phone: [phone]. You do not need to be fasting.  4. Medication instructions in preparation for your procedure:   Contrast Allergy: No   DO NOT Take Lasix the morning of the procedure  On the morning of your  procedure, take Aspirin and any morning medicines NOT listed above.  You may use sips of water.  5. Plan to go home the same day, you will only stay overnight if medically necessary. 6. You MUST have a responsible adult to drive you home. 7. An adult MUST be with you the first 24 hours after you arrive home. 8. Bring a current list of your medications, and the last time and date medication taken. 9. Bring ID and current insurance cards. 10.Please wear clothes that are easy to get on and off and wear slip-on shoes.  Thank you for allowing [place] to care for you!   -- [facility]   Important Information About Sugar

## 2022-03-27 NOTE — H&P (View-Only) (Signed)
Cardiology Office Note   Date:  03/27/2022   ID:  [name], DOB 12/28/1945, MRN [id number]  PCP:  Doreene Nest, NP  Cardiologist:   [name], MD   Chief Complaint  Patient presents with   Other    SOB with exertion and edema. Meds reviewed verbally with pt.      History of Present Illness: [name] is a 76 y.o. female who was referred by Vernona Rieger for evaluation of exertional dyspnea, chest pain and lower extremity edema.  She has chronic medical conditions that include essential hypertension, obesity, previous tobacco use, borderline diabetes mellitus, GERD, peripheral neuropathy and restless legs.  She had previous cardiac catheterization July 2006 that showed no significant coronary artery disease. She was seen by Dr. Mariah Milling in 2013 for atypical chest pain that was felt to be due to increased stress and anxiety.  She had an echocardiogram done in July 2020 that showed normal LV systolic function with no significant valvular abnormalities.  She reports recent symptoms of substernal chest heaviness and tightness radiating to her left arm lasting about 5 to 10 minutes.  In addition, she had significant worsening of exertional dyspnea.  She had cataract surgery recently.  Her blood pressure has been uncontrolled.  Past Medical History:  Diagnosis Date   Acute meniscal tear of left knee 01/19/2013   Blood transfusion without reported diagnosis    Depression    Diabetes mellitus    borderline   DJD (degenerative joint disease)    GERD (gastroesophageal reflux disease)    Hyperglycemia    Hypertension    Peripheral neuropathy    Restless leg    Syncope and collapse     Past Surgical History:  Procedure Laterality Date   APPENDECTOMY  1976   CARDIAC CATHETERIZATION  2006   @ [facility]   JOINT REPLACEMENT  06/2005   right total knee   KNEE ARTHROSCOPY WITH MEDIAL MENISECTOMY Left 02/11/2013   Procedure: LEFT KNEE ARTHROSCOPY WITH MEDIAL  MENISECTOMY;  Surgeon: Jacki Cones, MD;  Location: [facility];  Service: Orthopedics;  Laterality: Left;   MULTIPLE TOOTH EXTRACTIONS     OMENTECTOMY  07/21/2012   Procedure: OMENTECTOMY;  Surgeon: Adolph Pollack, MD;  Location: [facility];  Service: General;  Laterality: N/A;   right knee replacement  2008   TONSILLECTOMY  childhood   TUBAL LIGATION  1976   UMBILICAL HERNIA REPAIR  07/21/2012   Procedure: HERNIA REPAIR UMBILICAL ADULT;  Surgeon: Adolph Pollack, MD;  Location: [facility];  Service: General;  Laterality: N/A;     Current Outpatient Medications  Medication Sig Dispense Refill   aspirin 81 MG tablet Take 81 mg by mouth daily.     Calcium Carb-Cholecalciferol (CALCIUM 600 + D PO) Take by mouth. 2 daily     Difluprednate 0.05 % EMUL Apply 1 drop to eye 2 (two) times daily.     esomeprazole (NEXIUM) 20 MG capsule Take 20 mg by mouth every other day.     furosemide (LASIX) 20 MG tablet Take 1-2 tablets (20-40 mg total) by mouth daily. For leg swelling. Office visit required for further refills. 180 tablet 0   Latanoprostene Bunod (VYZULTA) 0.024 % SOLN Apply to eye at bedtime.     metoprolol succinate (TOPROL-XL) 50 MG 24 hr tablet Take 1 tablet (50 mg total) by mouth daily. for blood pressure. 90 tablet 2   ramipril (ALTACE) 5 MG capsule TAKE 1 CAPSULE (5  MG TOTAL) BY MOUTH DAILY FOR BLOOD PRESSURE. 90 capsule 2   vitamin B-12 (CYANOCOBALAMIN) 1000 MCG tablet Take 1,000 mcg by mouth daily.     No current facility-administered medications for this visit.    Allergies:   Patient has no known allergies.    Social History:  The patient  reports that she has quit smoking. Her smoking use included cigarettes. She has a 20.00 pack-year smoking history. She has never used smokeless tobacco. She reports current alcohol use. She reports that she does not use drugs.   Family History:  The patient's family history includes AAA (abdominal aortic aneurysm) in her father; Heart attack in  her father; Heart disease in her father; Hypertension in her father; Parkinsonism in her mother.    ROS:  Please see the history of present illness.   Otherwise, review of systems are positive for none.   All other systems are reviewed and negative.    PHYSICAL EXAM: VS:  BP (!) 186/72 (BP Location: Right Arm, Patient Position: Sitting, Cuff Size: Large)   Pulse 68   Ht 5\' 3"  (1.6 m)   Wt 240 lb 6 oz (109 kg)   SpO2 98%   BMI 42.58 kg/m  , BMI Body mass index is 42.58 kg/m. GEN: Well nourished, well developed, in no acute distress  HEENT: normal  Neck: no JVD, carotid bruits, or masses Cardiac: RRR; no murmurs, rubs, or gallops, bilateral lymphedema Respiratory:  clear to auscultation bilaterally, normal work of breathing GI: soft, nontender, nondistended, + BS MS: no deformity or atrophy  Skin: warm and dry, no rash Neuro:  Strength and sensation are intact Psych: euthymic mood, full affect   EKG:  EKG is ordered today. The ekg ordered today demonstrates normal sinus rhythm with PACs and left anterior fascicular block.   Recent Labs: 02/01/2022: ALT 19; BUN 13; Creatinine, Ser 0.77; Hemoglobin 14.1; Platelets 208.0; Potassium 4.0; Pro B Natriuretic peptide (BNP) 226.0; Sodium 140    Lipid Panel    Component Value Date/Time   CHOL 132 02/01/2022 1432   TRIG 133.0 02/01/2022 1432   HDL 55.20 02/01/2022 1432   CHOLHDL 2 02/01/2022 1432   VLDL 26.6 02/01/2022 1432   LDLCALC 50 02/01/2022 1432      Wt Readings from Last 3 Encounters:  03/27/22 240 lb 6 oz (109 kg)  02/01/22 245 lb 1 oz (111.2 kg)  01/01/22 241 lb (109.3 kg)          03/27/2022    1:56 PM  PAD Screen  Previous PAD dx? No  Previous surgical procedure? Yes  Pain with walking? No  Feet/toe relief with dangling? No  Painful, non-healing ulcers? No  Extremities discolored? No      ASSESSMENT AND PLAN:  1.  Exertional chest pain and shortness of breath: Her symptoms are suggestive of class III  angina.  In addition, she had recent worsening of leg edema and BNP was mildly elevated.  Suspect that she has an element of diastolic heart failure. I discussed with her different diagnostic options including stress testing and echocardiogram.  She is not able to exercise on a treadmill.  Considering her weight, noninvasive testing will be associated with decreased diagnostic accuracy.  Her symptoms are worrisome enough to justify further invasive cardiac evaluation.  I recommend a right and left cardiac catheterization and possible PCI.  I discussed the procedure in details as well as risks and benefits.  2.  Essential hypertension: Blood pressure is not controlled.  I elected to switch Toprol to carvedilol.  Continue ramipril.  3.  Heart failure: Likely diastolic with no recent echocardiogram.  Continue furosemide 20 mg once daily for now.  Right heart catheterization will help [place] determine her volume status and evaluate pulmonary pressure.    Disposition: Cardiac catheterization next week and follow-up in 1 month.  Signed,  [name], MD  03/27/2022 2:26 PM    [facility]

## 2022-03-27 NOTE — Progress Notes (Signed)
Cardiology Office Note   Date:  03/27/2022   ID:  [name], DOB 12/28/1945, MRN [id number]  PCP:  Doreene Nest, NP  Cardiologist:   [name], MD   Chief Complaint  Patient presents with   Other    SOB with exertion and edema. Meds reviewed verbally with pt.      History of Present Illness: [name] is a 76 y.o. female who was referred by Vernona Rieger for evaluation of exertional dyspnea, chest pain and lower extremity edema.  She has chronic medical conditions that include essential hypertension, obesity, previous tobacco use, borderline diabetes mellitus, GERD, peripheral neuropathy and restless legs.  She had previous cardiac catheterization July 2006 that showed no significant coronary artery disease. She was seen by Dr. Mariah Milling in 2013 for atypical chest pain that was felt to be due to increased stress and anxiety.  She had an echocardiogram done in July 2020 that showed normal LV systolic function with no significant valvular abnormalities.  She reports recent symptoms of substernal chest heaviness and tightness radiating to her left arm lasting about 5 to 10 minutes.  In addition, she had significant worsening of exertional dyspnea.  She had cataract surgery recently.  Her blood pressure has been uncontrolled.  Past Medical History:  Diagnosis Date   Acute meniscal tear of left knee 01/19/2013   Blood transfusion without reported diagnosis    Depression    Diabetes mellitus    borderline   DJD (degenerative joint disease)    GERD (gastroesophageal reflux disease)    Hyperglycemia    Hypertension    Peripheral neuropathy    Restless leg    Syncope and collapse     Past Surgical History:  Procedure Laterality Date   APPENDECTOMY  1976   CARDIAC CATHETERIZATION  2006   @ [facility]   JOINT REPLACEMENT  06/2005   right total knee   KNEE ARTHROSCOPY WITH MEDIAL MENISECTOMY Left 02/11/2013   Procedure: LEFT KNEE ARTHROSCOPY WITH MEDIAL  MENISECTOMY;  Surgeon: Jacki Cones, MD;  Location: [facility];  Service: Orthopedics;  Laterality: Left;   MULTIPLE TOOTH EXTRACTIONS     OMENTECTOMY  07/21/2012   Procedure: OMENTECTOMY;  Surgeon: Adolph Pollack, MD;  Location: [facility];  Service: General;  Laterality: N/A;   right knee replacement  2008   TONSILLECTOMY  childhood   TUBAL LIGATION  1976   UMBILICAL HERNIA REPAIR  07/21/2012   Procedure: HERNIA REPAIR UMBILICAL ADULT;  Surgeon: Adolph Pollack, MD;  Location: [facility];  Service: General;  Laterality: N/A;     Current Outpatient Medications  Medication Sig Dispense Refill   aspirin 81 MG tablet Take 81 mg by mouth daily.     Calcium Carb-Cholecalciferol (CALCIUM 600 + D PO) Take by mouth. 2 daily     Difluprednate 0.05 % EMUL Apply 1 drop to eye 2 (two) times daily.     esomeprazole (NEXIUM) 20 MG capsule Take 20 mg by mouth every other day.     furosemide (LASIX) 20 MG tablet Take 1-2 tablets (20-40 mg total) by mouth daily. For leg swelling. Office visit required for further refills. 180 tablet 0   Latanoprostene Bunod (VYZULTA) 0.024 % SOLN Apply to eye at bedtime.     metoprolol succinate (TOPROL-XL) 50 MG 24 hr tablet Take 1 tablet (50 mg total) by mouth daily. for blood pressure. 90 tablet 2   ramipril (ALTACE) 5 MG capsule TAKE 1 CAPSULE (5  MG TOTAL) BY MOUTH DAILY FOR BLOOD PRESSURE. 90 capsule 2   vitamin B-12 (CYANOCOBALAMIN) 1000 MCG tablet Take 1,000 mcg by mouth daily.     No current facility-administered medications for this visit.    Allergies:   Patient has no known allergies.    Social History:  The patient  reports that she has quit smoking. Her smoking use included cigarettes. She has a 20.00 pack-year smoking history. She has never used smokeless tobacco. She reports current alcohol use. She reports that she does not use drugs.   Family History:  The patient's family history includes AAA (abdominal aortic aneurysm) in her father; Heart attack in  her father; Heart disease in her father; Hypertension in her father; Parkinsonism in her mother.    ROS:  Please see the history of present illness.   Otherwise, review of systems are positive for none.   All other systems are reviewed and negative.    PHYSICAL EXAM: VS:  BP (!) 186/72 (BP Location: Right Arm, Patient Position: Sitting, Cuff Size: Large)   Pulse 68   Ht 5' 3" (1.6 m)   Wt 240 lb 6 oz (109 kg)   SpO2 98%   BMI 42.58 kg/m  , BMI Body mass index is 42.58 kg/m. GEN: Well nourished, well developed, in no acute distress  HEENT: normal  Neck: no JVD, carotid bruits, or masses Cardiac: RRR; no murmurs, rubs, or gallops, bilateral lymphedema Respiratory:  clear to auscultation bilaterally, normal work of breathing GI: soft, nontender, nondistended, + BS MS: no deformity or atrophy  Skin: warm and dry, no rash Neuro:  Strength and sensation are intact Psych: euthymic mood, full affect   EKG:  EKG is ordered today. The ekg ordered today demonstrates normal sinus rhythm with PACs and left anterior fascicular block.   Recent Labs: 02/01/2022: ALT 19; BUN 13; Creatinine, Ser 0.77; Hemoglobin 14.1; Platelets 208.0; Potassium 4.0; Pro B Natriuretic peptide (BNP) 226.0; Sodium 140    Lipid Panel    Component Value Date/Time   CHOL 132 02/01/2022 1432   TRIG 133.0 02/01/2022 1432   HDL 55.20 02/01/2022 1432   CHOLHDL 2 02/01/2022 1432   VLDL 26.6 02/01/2022 1432   LDLCALC 50 02/01/2022 1432      Wt Readings from Last 3 Encounters:  03/27/22 240 lb 6 oz (109 kg)  02/01/22 245 lb 1 oz (111.2 kg)  01/01/22 241 lb (109.3 kg)          03/27/2022    1:56 PM  PAD Screen  Previous PAD dx? No  Previous surgical procedure? Yes  Pain with walking? No  Feet/toe relief with dangling? No  Painful, non-healing ulcers? No  Extremities discolored? No      ASSESSMENT AND PLAN:  1.  Exertional chest pain and shortness of breath: Her symptoms are suggestive of class III  angina.  In addition, she had recent worsening of leg edema and BNP was mildly elevated.  Suspect that she has an element of diastolic heart failure. I discussed with her different diagnostic options including stress testing and echocardiogram.  She is not able to exercise on a treadmill.  Considering her weight, noninvasive testing will be associated with decreased diagnostic accuracy.  Her symptoms are worrisome enough to justify further invasive cardiac evaluation.  I recommend a right and left cardiac catheterization and possible PCI.  I discussed the procedure in details as well as risks and benefits.  2.  Essential hypertension: Blood pressure is not controlled.    I elected to switch Toprol to carvedilol.  Continue ramipril.  3.  Heart failure: Likely diastolic with no recent echocardiogram.  Continue furosemide 20 mg once daily for now.  Right heart catheterization will help [place] determine her volume status and evaluate pulmonary pressure.    Disposition: Cardiac catheterization next week and follow-up in 1 month.  Signed,  [name], MD  03/27/2022 2:26 PM    [facility]

## 2022-04-04 ENCOUNTER — Encounter: Payer: Self-pay | Admitting: Cardiovascular Disease

## 2022-04-04 ENCOUNTER — Encounter
Admission: RE | Disposition: A | Discharge status: Home / Self Care | Payer: Medicare HMO | Source: Home / Self Care | Attending: Cardiovascular Disease

## 2022-04-04 ENCOUNTER — Telehealth: Payer: Self-pay | Admitting: Cardiovascular Disease

## 2022-04-04 ENCOUNTER — Other Ambulatory Visit: Payer: Self-pay

## 2022-04-04 ENCOUNTER — Ambulatory Visit
Admission: RE | Admit: 2022-04-04 | Discharge: 2022-04-04 | Disposition: A | Discharge status: Home / Self Care | Payer: Medicare HMO | Attending: Cardiovascular Disease | Admitting: Cardiovascular Disease

## 2022-04-04 DIAGNOSIS — R0602 Shortness of breath: Secondary | ICD-10-CM

## 2022-04-04 DIAGNOSIS — I5032 Chronic diastolic (congestive) heart failure: Secondary | ICD-10-CM | POA: Diagnosis not present

## 2022-04-04 DIAGNOSIS — I272 Pulmonary hypertension, unspecified: Secondary | ICD-10-CM | POA: Diagnosis not present

## 2022-04-04 DIAGNOSIS — G2581 Restless legs syndrome: Secondary | ICD-10-CM | POA: Insufficient documentation

## 2022-04-04 DIAGNOSIS — Z6841 Body Mass Index (BMI) 40.0 and over, adult: Secondary | ICD-10-CM | POA: Diagnosis not present

## 2022-04-04 DIAGNOSIS — E1142 Type 2 diabetes mellitus with diabetic polyneuropathy: Secondary | ICD-10-CM | POA: Diagnosis not present

## 2022-04-04 DIAGNOSIS — E669 Obesity, unspecified: Secondary | ICD-10-CM | POA: Insufficient documentation

## 2022-04-04 DIAGNOSIS — K219 Gastro-esophageal reflux disease without esophagitis: Secondary | ICD-10-CM | POA: Insufficient documentation

## 2022-04-04 DIAGNOSIS — Z87891 Personal history of nicotine dependence: Secondary | ICD-10-CM | POA: Insufficient documentation

## 2022-04-04 DIAGNOSIS — I209 Angina pectoris, unspecified: Secondary | ICD-10-CM

## 2022-04-04 DIAGNOSIS — I110 Hypertensive heart disease with heart failure: Secondary | ICD-10-CM | POA: Insufficient documentation

## 2022-04-04 HISTORY — PX: RIGHT/LEFT HEART CATH AND CORONARY ANGIOGRAPHY: CATH118266

## 2022-04-04 SURGERY — RIGHT/LEFT HEART CATH AND CORONARY ANGIOGRAPHY
Anesthesia: Moderate Sedation | Laterality: Bilateral

## 2022-04-04 MED ORDER — HEPARIN (PORCINE) IN NACL 1000-0.9 UT/500ML-% IV SOLN
INTRAVENOUS | Status: DC | PRN
  Administered 2022-04-04 (×2): 500 mL

## 2022-04-04 MED ORDER — VERAPAMIL HCL 2.5 MG/ML IV SOLN
INTRAVENOUS | Status: DC | PRN
  Administered 2022-04-04: 2.5 mg via INTRA_ARTERIAL

## 2022-04-04 MED ORDER — FENTANYL CITRATE (PF) 100 MCG/2ML IJ SOLN
INTRAMUSCULAR | Status: AC
  Filled 2022-04-04: qty 2

## 2022-04-04 MED ORDER — MIDAZOLAM HCL 2 MG/2ML IJ SOLN
INTRAMUSCULAR | Status: DC | PRN
  Administered 2022-04-04 (×2): 1 mg via INTRAVENOUS

## 2022-04-04 MED ORDER — SODIUM CHLORIDE 0.9% FLUSH: 3.0000 mL | INTRAVENOUS | Status: DC | PRN

## 2022-04-04 MED ORDER — ONDANSETRON HCL 4 MG/2ML IJ SOLN: 4.0000 mg | Freq: Four times a day (QID) | INTRAMUSCULAR | Status: DC | PRN

## 2022-04-04 MED ORDER — HEPARIN SODIUM (PORCINE) 1000 UNIT/ML IJ SOLN
INTRAMUSCULAR | Status: AC
  Filled 2022-04-04: qty 10

## 2022-04-04 MED ORDER — SODIUM CHLORIDE 0.9 % IV SOLN: INTRAVENOUS | Status: DC

## 2022-04-04 MED ORDER — ASPIRIN 81 MG PO CHEW: 81.0000 mg | CHEWABLE_TABLET | ORAL | Status: DC

## 2022-04-04 MED ORDER — IOHEXOL 300 MG/ML  SOLN
INTRAMUSCULAR | Status: DC | PRN
  Administered 2022-04-04: 63 mL

## 2022-04-04 MED ORDER — SODIUM CHLORIDE 0.9% FLUSH: 3.0000 mL | Freq: Two times a day (BID) | INTRAVENOUS | Status: DC

## 2022-04-04 MED ORDER — VERAPAMIL HCL 2.5 MG/ML IV SOLN
INTRAVENOUS | Status: AC
  Filled 2022-04-04: qty 2

## 2022-04-04 MED ORDER — MIDAZOLAM HCL 2 MG/2ML IJ SOLN
INTRAMUSCULAR | Status: AC
  Filled 2022-04-04: qty 2

## 2022-04-04 MED ORDER — SODIUM CHLORIDE 0.9 % IV SOLN: 250.0000 mL | INTRAVENOUS | Status: DC | PRN

## 2022-04-04 MED ORDER — LIDOCAINE HCL 1 % IJ SOLN
INTRAMUSCULAR | Status: AC
  Filled 2022-04-04: qty 20

## 2022-04-04 MED ORDER — HEPARIN SODIUM (PORCINE) 1000 UNIT/ML IJ SOLN
INTRAMUSCULAR | Status: DC | PRN
  Administered 2022-04-04: 5000 [IU] via INTRAVENOUS

## 2022-04-04 MED ORDER — ACETAMINOPHEN 325 MG PO TABS: 650.0000 mg | ORAL_TABLET | ORAL | Status: DC | PRN

## 2022-04-04 MED ORDER — LIDOCAINE HCL (PF) 1 % IJ SOLN
INTRAMUSCULAR | Status: DC | PRN
  Administered 2022-04-04 (×2): 2 mL

## 2022-04-04 MED ORDER — FENTANYL CITRATE (PF) 100 MCG/2ML IJ SOLN
INTRAMUSCULAR | Status: DC | PRN
  Administered 2022-04-04: 25 ug via INTRAVENOUS

## 2022-04-04 MED ORDER — HEPARIN (PORCINE) IN NACL 1000-0.9 UT/500ML-% IV SOLN
INTRAVENOUS | Status: AC
  Filled 2022-04-04: qty 1000

## 2022-04-04 SURGICAL SUPPLY — 14 items
BAND CMPR LRG ZPHR (HEMOSTASIS) ×1
BAND ZEPHYR COMPRESS 30 LONG (HEMOSTASIS) ×1 IMPLANT
CATH BALLN WEDGE 5F 110CM (CATHETERS) ×1 IMPLANT
CATH INFINITI 5FR ANG PIGTAIL (CATHETERS) ×1 IMPLANT
CATH INFINITI 5FR JK (CATHETERS) ×1 IMPLANT
DRAPE BRACHIAL (DRAPES) ×2 IMPLANT
GLIDESHEATH SLEND SS 6F .021 (SHEATH) ×1 IMPLANT
GUIDEWIRE INQWIRE 1.5J.035X260 (WIRE) IMPLANT
INQWIRE 1.5J .035X260CM (WIRE) ×2
PACK CARDIAC CATH (CUSTOM PROCEDURE TRAY) ×2 IMPLANT
PROTECTION STATION PRESSURIZED (MISCELLANEOUS) ×2
SET ATX SIMPLICITY (MISCELLANEOUS) ×1 IMPLANT
SHEATH GLIDE SLENDER 4/5FR (SHEATH) ×1 IMPLANT
STATION PROTECTION PRESSURIZED (MISCELLANEOUS) IMPLANT

## 2022-04-04 NOTE — Telephone Encounter (Signed)
Echo order placed. Fwd to scheduling.

## 2022-04-04 NOTE — Interval H&P Note (Signed)
History and Physical Interval Note:  04/04/2022 8:20 AM  [name]  has presented today for surgery, with the diagnosis of R and L Cath   Angina pectoris.  The various methods of treatment have been discussed with the patient and family. After consideration of risks, benefits and other options for treatment, the patient has consented to  Procedure(s): RIGHT/LEFT HEART CATH AND CORONARY ANGIOGRAPHY (Bilateral) as a surgical intervention.  The patient's history has been reviewed, patient examined, no change in status, stable for surgery.  I have reviewed the patient's chart and labs.  Questions were answered to the patient's satisfaction.     [name]

## 2022-04-04 NOTE — Telephone Encounter (Signed)
Iran Ouch, MD  Parris-Godley, Monia Sabal, RN Please schedule this patient for an echocardiogram for evaluation of shortness of breath and possible ASD before her follow-up visit next month.

## 2022-04-25 ENCOUNTER — Ambulatory Visit: Payer: Medicare HMO | Admitting: Nurse Practitioner

## 2022-05-01 ENCOUNTER — Encounter: Payer: Self-pay | Admitting: Nurse Practitioner

## 2022-05-01 ENCOUNTER — Ambulatory Visit: Payer: Medicare HMO | Attending: Nurse Practitioner | Admitting: Nurse Practitioner

## 2022-05-01 ENCOUNTER — Other Ambulatory Visit
Admission: RE | Admit: 2022-05-01 | Discharge: 2022-05-01 | Disposition: A | Discharge status: Home / Self Care | Payer: Medicare HMO | Attending: Nurse Practitioner | Admitting: Nurse Practitioner

## 2022-05-01 VITALS — BP 144/78 | HR 64 | Ht 63.0 in | Wt 241.2 lb

## 2022-05-01 DIAGNOSIS — I10 Essential (primary) hypertension: Secondary | ICD-10-CM | POA: Diagnosis not present

## 2022-05-01 DIAGNOSIS — I5032 Chronic diastolic (congestive) heart failure: Secondary | ICD-10-CM | POA: Diagnosis not present

## 2022-05-01 DIAGNOSIS — G4730 Sleep apnea, unspecified: Secondary | ICD-10-CM | POA: Diagnosis not present

## 2022-05-01 DIAGNOSIS — R072 Precordial pain: Secondary | ICD-10-CM

## 2022-05-01 LAB — BASIC METABOLIC PANEL
Anion gap: 10 (ref 5–15)
BUN: 17 mg/dL (ref 8–23)
CO2: 26 mmol/L (ref 22–32)
Calcium: 9.7 mg/dL (ref 8.9–10.3)
Chloride: 107 mmol/L (ref 98–111)
Creatinine, Ser: 0.79 mg/dL (ref 0.44–1.00)
GFR, Estimated: >60 mL/min (ref >60)
Glucose, Bld: 105 mg/dL — ABNORMAL HIGH (ref 70–99)
Potassium: 3.8 mmol/L (ref 3.5–5.1)
Sodium: 143 mmol/L (ref 135–145)

## 2022-05-01 NOTE — Patient Instructions (Signed)
Medication Instructions:  - Your physician recommends that you continue on your current medications as directed. Please refer to the Current Medication list given to you today.  *If you need a refill on your cardiac medications before your next appointment, please call your pharmacy*   Lab Work: - Your physician recommends that you have lab work today:   [employer]  Medical Mall Entrance at [facility] 1st desk on the right to check in (REGISTRATION)  Lab hours: Monday- Friday (7:30 am- 5:30 pm)   If you have labs (blood work) drawn today and your tests are completely normal, you will receive your results only by: MyChart Message (if you have MyChart) OR A paper copy in the mail If you have any lab test that is abnormal or we need to change your treatment, we will call you to review the results.   Testing/Procedures:  1) Limited Echocardiogram with Bubble Study Your physician has requested that you have an echocardiogram (limited with bubble study). Echocardiography is a painless test that uses sound waves to create images of your heart. It provides your doctor with information about the size and shape of your heart and how well your heart's chambers and valves are working. This procedure takes approximately one hour. There are no restrictions for this procedure. There is a possibility that an IV may need to be started during your test to inject an image enhancing agent. This is done to obtain more optimal pictures of your heart. Therefore we ask that you do at least drink some water prior to coming in to hydrate your veins.    2) You have been referred to : [facility] for evaluation of sleep apnea.  Their office will contact you directly with an appointment, but if you do not hear from them within 2 weeks, please call the office directly at [phone].     Follow-Up: At [facility], you and your health needs are our priority.  As part of our continuing mission to provide  you with exceptional heart care, we have created designated Provider Care Teams.  These Care Teams include your primary Cardiologist (physician) and Advanced Practice Providers (APPs -  Physician Assistants and Nurse Practitioners) who all work together to provide you with the care you need, when you need it.  We recommend signing up for the patient portal called "MyChart".  Sign up information is provided on this After Visit Summary.  MyChart is used to connect with patients for Virtual Visits (Telemedicine).  Patients are able to view lab/test results, encounter notes, upcoming appointments, etc.  Non-urgent messages can be sent to your provider as well.   To learn more about what you can do with MyChart, go to [email or web address].    Your next appointment:   3-4 week(s)  The format for your next appointment:   In Person  Provider:   You may see [name], MD or one of the following Advanced Practice Providers on your designated Care Team:   Nicolasa Ducking, NP   Other Instructions    Important Information About Sugar

## 2022-05-01 NOTE — Progress Notes (Signed)
Office Visit    Patient Name: [name] Date of Encounter: 05/01/2022  Primary Care Provider:  Doreene Nest, NP Primary Cardiologist:  [name], MD  Chief Complaint    76 year old female with a history of hypertension, borderline diabetes, obesity, prior tobacco abuse, GERD, peripheral neuropathy, and restless legs, who presents for follow-up after recent diagnostic catheterization in the setting of chest pain.  Past Medical History    Past Medical History:  Diagnosis Date   Acute meniscal tear of left knee 01/19/2013   Blood transfusion without reported diagnosis    Chest pain    a. 2006 Cath: Nl cors; b. 03/2022 Cath: Nl cors.   Chronic heart failure with preserved ejection fraction (HFpEF) (HCC)    a. 02/2019 Echo: EF 60-65%, no rwma, mildly enlarged RV, nl RV fxn, mildly dil LA; b. 03/2022 RHC:  RA 11, RV 45/5, PA 47/18 (35), PCWP 22, and LVEDP 18.   Depression    Diabetes mellitus    borderline   DJD (degenerative joint disease)    GERD (gastroesophageal reflux disease)    Hyperglycemia    Hypertension    Peripheral neuropathy    Restless leg    Syncope and collapse    Past Surgical History:  Procedure Laterality Date   APPENDECTOMY  08/27/1974   CARDIAC CATHETERIZATION  08/27/2004   @ [place]   CATARACT EXTRACTION     02/10/2022 (right eye) and 03/02/2022 (left eye)   JOINT REPLACEMENT  06/27/2005   right total knee   KNEE ARTHROSCOPY WITH MEDIAL MENISECTOMY Left 02/11/2013   Procedure: LEFT KNEE ARTHROSCOPY WITH MEDIAL MENISECTOMY;  Surgeon: Jacki Cones, MD;  Location: [facility];  Service: Orthopedics;  Laterality: Left;   MULTIPLE TOOTH EXTRACTIONS     OMENTECTOMY  07/21/2012   Procedure: OMENTECTOMY;  Surgeon: Adolph Pollack, MD;  Location: [facility];  Service: General;  Laterality: N/A;   right knee replacement  08/27/2006   RIGHT/LEFT HEART CATH AND CORONARY ANGIOGRAPHY Bilateral 04/04/2022   Procedure: RIGHT/LEFT HEART CATH AND  CORONARY ANGIOGRAPHY;  Surgeon: Iran Ouch, MD;  Location: [facility];  Service: Cardiovascular;  Laterality: Bilateral;   TONSILLECTOMY  childhood   TUBAL LIGATION  08/27/1974   UMBILICAL HERNIA REPAIR  07/21/2012   Procedure: HERNIA REPAIR UMBILICAL ADULT;  Surgeon: Adolph Pollack, MD;  Location: [facility];  Service: General;  Laterality: N/A;    Allergies  No Known Allergies  History of Present Illness    76 year old female with above past medical history including hypertension, borderline diabetes, obesity, prior tobacco abuse, GERD, peripheral neuropathy, and restless legs.  She previously underwent diagnostic catheterization in July 2006 that showed no significant coronary artery disease.  She was seen by Dr. Mariah Milling in 2013 for atypical chest pain that was felt to be due to increased stress and anxiety and was managed conservatively.  An echocardiogram was performed through her primary care provider's office in July 2020 that showed normal LV function without significant valvular abnormalities.  She was referred to Dr. Kirke Corin and was seen in clinic on March 27, 2022 in the setting of substernal chest heaviness and tightness rating to her left arm lasting about 5 to 10 minutes, and resolving spontaneously.  In addition, she complained of worsening exertional dyspnea.  She underwent diagnostic catheterization on April 04, 2022 revealing normal coronary arteries with an EF of 55 to 65%.  Right heart catheterization showed mildly elevated filling pressures with an RA  of 11, RV 45/5, PA 47/18 (35), wedge of 22, and LVEDP of 18.  PA saturation was abnormally high at 84%, potentially indicating a left-to-right shunt.  Recommendation was made for continuation of Lasix 20 mg daily as well as echo with bubble study to assess for shunting.  Since diagnostic catheterization, she notes that she has been taking Lasix 20 mg twice daily.  This is helped with her lower extremity swelling.  She is  sedentary at baseline and had cataract surgery earlier this summer, which has limited her activities further.  She does experience dyspnea and fatigue with minimal activity.  She does not routinely weigh herself at home or check her blood pressure.  She continues to experience a bandlike discomfort around her lower chest/upper abdomen.  She denies palpitations, PND, orthopnea, dizziness, syncope, or early satiety.  She does believe that she snores.  Home Medications    Current Outpatient Medications  Medication Sig Dispense Refill   aspirin 81 MG tablet Take 81 mg by mouth daily.     Calcium Carb-Cholecalciferol (CALCIUM 600 + D PO) Take by mouth. 2 daily     carvedilol (COREG) 12.5 MG tablet Take 1 tablet (12.5 mg total) by mouth 2 (two) times daily. 60 tablet 5   esomeprazole (NEXIUM) 20 MG capsule Take 20 mg by mouth every other day.     furosemide (LASIX) 20 MG tablet Take 1-2 tablets (20-40 mg total) by mouth daily. For leg swelling. Office visit required for further refills. 180 tablet 0   ramipril (ALTACE) 5 MG capsule TAKE 1 CAPSULE (5 MG TOTAL) BY MOUTH DAILY FOR BLOOD PRESSURE. 90 capsule 2   vitamin B-12 (CYANOCOBALAMIN) 1000 MCG tablet Take 1,000 mcg by mouth daily.     No current facility-administered medications for this visit.     Review of Systems    Ongoing dyspnea on exertion and fatigue with daytime somnolence.  She does believe that she snores and has never had a sleep study.  She occasionally notes bandlike pressure around her upper abdomen and lower chest.  Ongoing lower extremity swelling.  She denies palpitations, PND, orthopnea, dizziness, syncope, or early satiety..  All other systems reviewed and are otherwise negative except as noted above.    Physical Exam    VS:  BP (!) 144/78   Pulse 64   Ht 5\' 3"  (1.6 m)   Wt 241 lb 3.2 oz (109.4 kg)   SpO2 97%   BMI 42.73 kg/m  , BMI Body mass index is 42.73 kg/m. STOP-Bang Score:  7      Vitals:   05/01/22 1455  05/01/22 1749  BP: (!) 152/78 (!) 144/78  Pulse: 64   SpO2: 97%     GEN: Obese, in no acute distress. HEENT: normal. Neck: Supple, lobe has, difficult to gauge JVP.  No bruits or masses.   Cardiac: RRR, no murmurs, rubs, or gallops. No clubbing, cyanosis, 1+ bilateral lower extremity edema.  Radials/PT 2+ and equal bilaterally.  Respiratory:  Respirations regular and unlabored, clear to auscultation bilaterally. GI: Obese, semifirm around her lower abdomen.  Nondistended. BS + x 4. MS: no deformity or atrophy. Skin: warm and dry, no rash. Neuro:  Strength and sensation are intact. Psych: Normal affect.  Accessory Clinical Findings    ECG personally reviewed by me today -regular sinus rhythm, 64, PAC, left axis deviation, left anterior fascicular block- no acute changes.  Lab Results  Component Value Date   WBC 9.6 03/27/2022   HGB 14.4  03/27/2022   HCT 43.9 03/27/2022   MCV 88.5 03/27/2022   PLT 242 03/27/2022   Lab Results  Component Value Date   CREATININE 0.79 05/01/2022   BUN 17 05/01/2022   NA 143 05/01/2022   K 3.8 05/01/2022   CL 107 05/01/2022   CO2 26 05/01/2022   Lab Results  Component Value Date   ALT 19 02/01/2022   AST 19 02/01/2022   ALKPHOS 82 02/01/2022   BILITOT 0.8 02/01/2022   Lab Results  Component Value Date   CHOL 132 02/01/2022   HDL 55.20 02/01/2022   LDLCALC 50 02/01/2022   TRIG 133.0 02/01/2022   CHOLHDL 2 02/01/2022    Lab Results  Component Value Date   HGBA1C 5.4 02/01/2022    Assessment & Plan    1.  Chronic heart failure with preserved ejection fraction: Patient recently underwent diagnostic catheterization in the setting of chest pain and dyspnea.  She was found to have normal coronary arteries with mild elevation of filling pressures with a right atrial pressure of 11, PA 47/18 (35), PCWP of 22, and LVEDP of 18.  She has been taking Lasix 20 mg twice daily since then and has noted some improvement in lower extremity swelling.   She sits most of the day and has 1+ bilateral lower extremity edema today.  Body habitus makes exam otherwise challenging.  Follow-up lab work today showed stable renal function electrolytes.  We will continue Lasix 20 mg twice daily and I will add spironolactone 25 mg daily with plan for follow-up basic metabolic panel next week.  I suspect she would benefit from initiation of an SGLT2 inhibitor however due to cost, she prefers to avoid at this time.  She would likely also benefit from a GLP-1 agonist and we can continue to look into these medications going forward.  In the setting of elevated PA saturation of 84% on diagnostic catheterization, I will arrange for a 2D echocardiogram with bubble study to rule out left-to-right shunt.  We discussed the importance of daily weights, sodium restriction, medication compliance, and symptom reporting and she verbalizes understanding.  I encouraged her to purchase compression socks as I suspect this will help greatly with her lower extremity swelling.  She does snore and we will arrange for pulmonary evaluation for sleep study.  2.  Chest pain: Recent catheterization with normal coronary arteries.  Today she is describing a bandlike sensation around her lower chest/upper abdomen.  She does have some abdominal fullness which I suspect might be related to some amount of volume overload as well as obesity.  No further ischemic evaluation is warranted at this time.  3.  Essential hypertension: Blood pressure elevated today on 2 checks-144/78.  She does not check her blood pressure at home.  As labs are stable, we will add spironolactone 25 mg daily.  Continue current doses of carvedilol, ramipril, and Lasix.  4.  Sleep disordered breathing: Patient notes history of snoring as well as daytime somnolence and fatigue.  STOP-BANG equals 7.  Will refer to pulmonology for sleep eval.  5.  Elevated pulmonary arterial oxygen saturation: PA sat of 84% on right heart  catheterization with question of left to right shunting.  We will arrange for echocardiogram with bubble study as outlined above.  6.  Disposition: Follow-up echo with bubble study as outlined above.  Follow-up in clinic in 3 to 4 weeks or sooner if necessary.   Nicolasa Ducking, NP 05/01/2022, 5:50 PM

## 2022-05-03 ENCOUNTER — Telehealth: Payer: Self-pay | Admitting: *Deleted

## 2022-05-03 DIAGNOSIS — I5032 Chronic diastolic (congestive) heart failure: Secondary | ICD-10-CM

## 2022-05-03 DIAGNOSIS — I10 Essential (primary) hypertension: Secondary | ICD-10-CM

## 2022-05-03 MED ORDER — SPIRONOLACTONE 25 MG PO TABS: 25.0000 mg | ORAL_TABLET | Freq: Every day | ORAL | 3 refills | Status: DC

## 2022-05-03 NOTE — Telephone Encounter (Addendum)
Reviewed results and recommendations. All questions were answered and she verbalized understanding of all instructions. Instructed her to call back if she should have any further questions.   These instructions have been sent via My Chart as well.

## 2022-05-03 NOTE — Telephone Encounter (Signed)
-----   Message from Creig Hines, NP sent at 05/01/2022  5:19 PM EDT ----- Kidney function and electrolytes are normal.  I recommend that she continue taking lasix 20mg  twice daily (or can take 40 once daily if more convenient).  With stable labs, as we discussed, I also recommend starting spironolactone 25mg  daily and she should have a f/u bmet in 1 week to reassess kidneys and electrolytes.

## 2022-05-07 ENCOUNTER — Ambulatory Visit
Admission: RE | Admit: 2022-05-07 | Discharge: 2022-05-07 | Disposition: A | Discharge status: Home / Self Care | Payer: Medicare HMO | Source: Ambulatory Visit | Attending: Primary Care | Admitting: Primary Care

## 2022-05-07 DIAGNOSIS — E2839 Other primary ovarian failure: Secondary | ICD-10-CM | POA: Diagnosis present

## 2022-05-11 ENCOUNTER — Other Ambulatory Visit
Admission: RE | Admit: 2022-05-11 | Discharge: 2022-05-11 | Disposition: A | Discharge status: Home / Self Care | Payer: Medicare HMO | Attending: Nurse Practitioner | Admitting: Nurse Practitioner

## 2022-05-11 DIAGNOSIS — I10 Essential (primary) hypertension: Secondary | ICD-10-CM | POA: Diagnosis present

## 2022-05-11 DIAGNOSIS — I5032 Chronic diastolic (congestive) heart failure: Secondary | ICD-10-CM | POA: Diagnosis present

## 2022-05-11 LAB — BASIC METABOLIC PANEL
Anion gap: 10 (ref 5–15)
BUN: 18 mg/dL (ref 8–23)
CO2: 25 mmol/L (ref 22–32)
Calcium: 9.9 mg/dL (ref 8.9–10.3)
Chloride: 107 mmol/L (ref 98–111)
Creatinine, Ser: 0.92 mg/dL (ref 0.44–1.00)
GFR, Estimated: >60 mL/min (ref >60)
Glucose, Bld: 109 mg/dL — ABNORMAL HIGH (ref 70–99)
Potassium: 4.4 mmol/L (ref 3.5–5.1)
Sodium: 142 mmol/L (ref 135–145)

## 2022-06-08 ENCOUNTER — Ambulatory Visit: Payer: Medicare HMO | Attending: Nurse Practitioner

## 2022-06-08 DIAGNOSIS — I5032 Chronic diastolic (congestive) heart failure: Secondary | ICD-10-CM | POA: Diagnosis not present

## 2022-06-12 ENCOUNTER — Other Ambulatory Visit
Admission: RE | Admit: 2022-06-12 | Discharge: 2022-06-12 | Disposition: A | Discharge status: Home / Self Care | Payer: Medicare HMO | Source: Ambulatory Visit | Attending: Nurse Practitioner | Admitting: Nurse Practitioner

## 2022-06-12 ENCOUNTER — Ambulatory Visit: Payer: Medicare HMO | Attending: Nurse Practitioner | Admitting: Nurse Practitioner

## 2022-06-12 ENCOUNTER — Encounter: Payer: Self-pay | Admitting: Nurse Practitioner

## 2022-06-12 VITALS — BP 108/68 | HR 59 | Ht 62.0 in | Wt 235.0 lb

## 2022-06-12 DIAGNOSIS — I5032 Chronic diastolic (congestive) heart failure: Secondary | ICD-10-CM | POA: Insufficient documentation

## 2022-06-12 DIAGNOSIS — G4730 Sleep apnea, unspecified: Secondary | ICD-10-CM | POA: Diagnosis not present

## 2022-06-12 DIAGNOSIS — Q211 Atrial septal defect, unspecified: Secondary | ICD-10-CM | POA: Diagnosis not present

## 2022-06-12 DIAGNOSIS — I10 Essential (primary) hypertension: Secondary | ICD-10-CM | POA: Insufficient documentation

## 2022-06-12 LAB — BASIC METABOLIC PANEL
Anion gap: 4 — ABNORMAL LOW (ref 5–15)
BUN: 22 mg/dL (ref 8–23)
CO2: 25 mmol/L (ref 22–32)
Calcium: 9.7 mg/dL (ref 8.9–10.3)
Chloride: 109 mmol/L (ref 98–111)
Creatinine, Ser: 0.99 mg/dL (ref 0.44–1.00)
GFR, Estimated: 58 mL/min — ABNORMAL LOW (ref >60)
Glucose, Bld: 114 mg/dL — ABNORMAL HIGH (ref 70–99)
Potassium: 4.6 mmol/L (ref 3.5–5.1)
Sodium: 138 mmol/L (ref 135–145)

## 2022-06-12 NOTE — Progress Notes (Signed)
Office Visit    Patient Name: [name] Date of Encounter: 06/12/2022  Primary Care Provider:  Pleas Koch, NP Primary Cardiologist:  Kathlyn Sacramento, MD  Chief Complaint    76 year old female with a history of hypertension, borderline diabetes, obesity, prior tobacco abuse, GERD, peripheral neuropathy, and restless legs, presents for follow-up for HFpEF and atrial septal defect.  Past Medical History    Past Medical History:  Diagnosis Date   Acute meniscal tear of left knee 01/19/2013   Atrial septal defect    a. 03/2022 RHC: PA sat 84%; b. 05/2022 Echo: EF 60-65%, suspected ostium secundum ASD w/ predominantly L->R shunting and + bubble study.   Blood transfusion without reported diagnosis    Chest pain    a. 2006 Cath: Nl cors; b. 03/2022 Cath: Nl cors.   Chronic heart failure with preserved ejection fraction (HFpEF) ([place])    a. 02/2019 Echo: EF 60-65%, no rwma, mildly enlarged RV, nl RV fxn, mildly dil LA; b. 03/2022 RHC:  RA 11, RV 45/5, PA 47/18 (35), PCWP 22, and LVEDP 18.   Depression    Diabetes mellitus    borderline   DJD (degenerative joint disease)    GERD (gastroesophageal reflux disease)    Hyperglycemia    Hypertension    Peripheral neuropathy    Restless leg    Syncope and collapse    Past Surgical History:  Procedure Laterality Date   APPENDECTOMY  08/27/1974   CARDIAC CATHETERIZATION  08/27/2004   @    CATARACT EXTRACTION     02/10/2022 (right eye) and 03/02/2022 (left eye)   JOINT REPLACEMENT  06/27/2005   right total knee   KNEE ARTHROSCOPY WITH MEDIAL MENISECTOMY Left 02/11/2013   Procedure: LEFT KNEE ARTHROSCOPY WITH MEDIAL MENISECTOMY;  Surgeon: Tobi Bastos, MD;  Location: [facility];  Service: Orthopedics;  Laterality: Left;   MULTIPLE TOOTH EXTRACTIONS     OMENTECTOMY  07/21/2012   Procedure: OMENTECTOMY;  Surgeon: Odis Hollingshead, MD;  Location: [facility];  Service: General;  Laterality: N/A;   right knee replacement   08/27/2006   RIGHT/LEFT HEART CATH AND CORONARY ANGIOGRAPHY Bilateral 04/04/2022   Procedure: RIGHT/LEFT HEART CATH AND CORONARY ANGIOGRAPHY;  Surgeon: Wellington Hampshire, MD;  Location: [place] [facility];  Service: Cardiovascular;  Laterality: Bilateral;   TONSILLECTOMY  childhood   TUBAL LIGATION  37/16/9678   UMBILICAL HERNIA REPAIR  07/21/2012   Procedure: HERNIA REPAIR UMBILICAL ADULT;  Surgeon: Odis Hollingshead, MD;  Location: [facility];  Service: General;  Laterality: N/A;    Allergies  No Known Allergies  History of Present Illness    76 year old female with above past medical history including hypertension, borderline diabetes, obesity, prior tobacco abuse, GERD, peripheral neuropathy, and restless legs.  She previously underwent diagnostic catheterization in July 2006 that showed no significant coronary artery disease.  She was seen by Dr. Rockey Situ in 2013 for atypical chest pain that was felt to be due to increased stress and anxiety, and was managed conservatively.  Echocardiogram performed through her primary care provider's office in July 2020, showed normal LV function without significant valvular abnormalities.  She was referred to Dr. Fletcher Anon in August 2023 in the setting of substernal chest heaviness and tightness.  In addition, she noted worsening dyspnea on exertion.  She underwent diagnostic catheterization in August 2023 revealing normal coronary arteries with an EF of 55 to 65%.  Right heart cath showed mildly elevated filling pressures and  she was noted to have a PA saturation that was abnormally high at 84%.  She was continued on Lasix 20 mg daily and at follow-up visit in early September, she had ongoing dyspnea with minimal activity and reported taking Lasix 20 mg twice daily.  Spironolactone 25 mg daily was added in the setting of diastolic dysfunction and hypertension with stable labs at follow-up.  A limited echocardiogram with bubble study was performed and showed normal LV  function with a small suspected ostium secundum ASD with predominantly left-to-right shunting.  Since her last visit, Ms. Lammert has noted some improvement in activity tolerance.  She walked a good distance the other day at an outdoor mall and surprised herself.  She does continue to experience dyspnea on exertion however.  Her weight is down 6 pounds since her last visit and with this, she has had reduced abdominal girth and resolution of the bandlike sensation that she was previously experiencing across her epigastrium.  Lower extremity swelling is also improved.  She denies chest pain, palpitations, PND, orthopnea, dizziness, syncope, or early satiety.  Home Medications    Current Outpatient Medications  Medication Sig Dispense Refill   aspirin 81 MG tablet Take 81 mg by mouth daily.     Calcium Carb-Cholecalciferol (CALCIUM 600 + D PO) Take by mouth. 2 daily     carvedilol (COREG) 12.5 MG tablet Take 1 tablet (12.5 mg total) by mouth 2 (two) times daily. 60 tablet 5   esomeprazole (NEXIUM) 20 MG capsule Take 20 mg by mouth every other day.     furosemide (LASIX) 20 MG tablet Take 1-2 tablets (20-40 mg total) by mouth daily. For leg swelling. Office visit required for further refills. 180 tablet 0   ramipril (ALTACE) 5 MG capsule TAKE 1 CAPSULE (5 MG TOTAL) BY MOUTH DAILY FOR BLOOD PRESSURE. 90 capsule 2   spironolactone (ALDACTONE) 25 MG tablet Take 1 tablet (25 mg total) by mouth daily. 90 tablet 3   vitamin B-12 (CYANOCOBALAMIN) 1000 MCG tablet Take 1,000 mcg by mouth daily.     No current facility-administered medications for this visit.     Review of Systems    Ongoing dyspnea on exertion though has noted some improvement in activity tolerance.  Mild lower extremity swelling persists but is improved.  She denies chest pain, palpitations, PND, orthopnea, dizziness, syncope, or early satiety..  All other systems reviewed and are otherwise negative except as noted above.    Physical  Exam    VS:  BP 108/68 (BP Location: Left Arm, Patient Position: Sitting, Cuff Size: Large)   Pulse (!) 59   Ht 5\' 2"  (1.575 m)   Wt 235 lb (106.6 kg)   SpO2 98%   BMI 42.98 kg/m  , BMI Body mass index is 42.98 kg/m. STOP-Bang Score:  7      GEN: Well nourished, well developed, in no acute distress. HEENT: normal. Neck: Supple obese, difficult to gauge JVP.  No bruits or masses.  Cardiac: RRR, no murmurs, rubs, or gallops. No clubbing, cyanosis.  She has large lower legs but only trace pitting edema.  Radials/DP/PT 2+ and equal bilaterally.  Respiratory:  Respirations regular and unlabored, clear to auscultation bilaterally. GI: Obese, soft, nontender, nondistended, BS + x 4. MS: no deformity or atrophy. Skin: warm and dry, no rash. Neuro:  Strength and sensation are intact. Psych: Normal affect.  Accessory Clinical Findings    Lab Results  Component Value Date   WBC 9.6 03/27/2022  HGB 14.4 03/27/2022   HCT 43.9 03/27/2022   MCV 88.5 03/27/2022   PLT 242 03/27/2022   Lab Results  Component Value Date   CREATININE 0.99 06/12/2022   BUN 22 06/12/2022   NA 138 06/12/2022   K 4.6 06/12/2022   CL 109 06/12/2022   CO2 25 06/12/2022   Lab Results  Component Value Date   ALT 19 02/01/2022   AST 19 02/01/2022   ALKPHOS 82 02/01/2022   BILITOT 0.8 02/01/2022   Lab Results  Component Value Date   CHOL 132 02/01/2022   HDL 55.20 02/01/2022   LDLCALC 50 02/01/2022   TRIG 133.0 02/01/2022   CHOLHDL 2 02/01/2022    Lab Results  Component Value Date   HGBA1C 5.4 02/01/2022    Assessment & Plan    1.  Chronic heart failure with preserved ejection fraction: Recent diagnostic catheterization showed normal coronary arteries with mildly elevated filling pressures.  She has been doing well on Lasix 20 mg twice daily and since adding spironolactone at her last visit, blood pressure has been improved as well as activity tolerance.  She does continue to experience dyspnea on  exertion however.  Weight is down 6 pounds since her last visit.  She has trace lower extremity swelling, which is an improvement.  Heart rate and blood pressure now well controlled.  Follow-up basic metabolic panel today shows stable renal function and electrolytes.  Continue current doses of carvedilol, ramipril, furosemide, and spironolactone.  As previously noted, SGLT2 inhibitor and GLP-1 agonist are felt to be cost prohibitive.  Her daughter is present with her today, we had a long discussion about sodium avoidance, daily weights, and increasing activity with a goal of slow and steady weight loss.  2.  Essential hypertension: Blood pressure much improved since adding spironolactone.  Continue current doses of carvedilol, ramipril, spironolactone, and Lasix.  Stable lab work as outlined above.  3.  Sleep disordered breathing: Pending pulmonology referral for sleep study.  4.  Atrial septal defect: PA sat of 84% on right heart catheterization with small ASD and left-to-right shunting noted on recent follow-up echo.  Discussed echo results in detail today.  Continue medical therapy and volume management as outlined in #1.  We discussed that if patient continues to have significant lifestyle limiting dyspnea on exertion or any progression of symptoms, we would consider transesophageal echocardiogram to better evaluate ASD.  She wishes to avoid any additional procedures unless absolutely necessary.  5.  History of chest pain: No recurrence with improved volume status.  Normal coronary arteries by catheterization earlier this year.  6.  Disposition: Follow-up in clinic in 6 to 8 weeks or sooner if necessary.  Murray Hodgkins, NP 06/12/2022, 6:13 PM

## 2022-06-12 NOTE — Patient Instructions (Signed)
Medication Instructions:  Your physician recommends that you continue on your current medications as directed. Please refer to the Current Medication list given to you today.  *If you need a refill on your cardiac medications before your next appointment, please call your pharmacy*   Lab Work: BMP - Please go to the [facility]. You will check in at the front desk to the right as you walk into the atrium. Valet Parking is offered if needed. - No appointment needed. You may go any day between 7 am and 6 pm.   If you have labs (blood work) drawn today and your tests are completely normal, you will receive your results only by: [place] (if you have MyChart) OR A paper copy in the mail If you have any lab test that is abnormal or we need to change your treatment, we will call you to review the results.   Testing/Procedures: None ordered  Follow-Up: At [facility], you and your health needs are our priority.  As part of our continuing mission to provide you with exceptional heart care, we have created designated Provider Care Teams.  These Care Teams include your primary Cardiologist (physician) and Advanced Practice Providers (APPs -  Physician Assistants and Nurse Practitioners) who all work together to provide you with the care you need, when you need it.  We recommend signing up for the patient portal called "MyChart".  Sign up information is provided on this After Visit Summary.  MyChart is used to connect with patients for Virtual Visits (Telemedicine).  Patients are able to view lab/test results, encounter notes, upcoming appointments, etc.  Non-urgent messages can be sent to your provider as well.   To learn more about what you can do with MyChart, go to [email or web address].    Your next appointment:   6-8 week(s)  The format for your next appointment:   In Person  Provider:   You may see Kathlyn Sacramento, MD or one of the following Advanced Practice  Providers on your designated Care Team:   Murray Hodgkins, NP  Important Information About Sugar

## 2022-06-28 ENCOUNTER — Institutional Professional Consult (permissible substitution): Payer: Medicare HMO | Admitting: Adult Health

## 2022-08-01 ENCOUNTER — Ambulatory Visit: Payer: Medicare HMO | Attending: Nurse Practitioner | Admitting: Nurse Practitioner

## 2022-08-01 ENCOUNTER — Encounter: Payer: Self-pay | Admitting: Nurse Practitioner

## 2022-08-01 VITALS — BP 129/63 | HR 61 | Ht 62.0 in | Wt 236.2 lb

## 2022-08-01 DIAGNOSIS — I10 Essential (primary) hypertension: Secondary | ICD-10-CM

## 2022-08-01 DIAGNOSIS — Q211 Atrial septal defect, unspecified: Secondary | ICD-10-CM

## 2022-08-01 DIAGNOSIS — I5033 Acute on chronic diastolic (congestive) heart failure: Secondary | ICD-10-CM

## 2022-08-01 DIAGNOSIS — G4730 Sleep apnea, unspecified: Secondary | ICD-10-CM

## 2022-08-01 DIAGNOSIS — R072 Precordial pain: Secondary | ICD-10-CM

## 2022-08-01 MED ORDER — FUROSEMIDE 40 MG PO TABS: 40.0000 mg | ORAL_TABLET | Freq: Two times a day (BID) | ORAL | 6 refills | Status: DC

## 2022-08-01 NOTE — Progress Notes (Signed)
Office Visit    Patient Name: [name] Date of Encounter: 08/01/2022  Primary Care Provider:  Doreene Nest, NP Primary Cardiologist:  Joy Bears, MD  Chief Complaint    76 year old female with a history of hypertension, ASD, borderline diabetes, obesity, prior tobacco abuse, GERD, peripheral neuropathy, and restless legs, who presents for HFpEF follow-up.  Past Medical History    Past Medical History:  Diagnosis Date   Acute meniscal tear of left knee 01/19/2013   Atrial septal defect    a. 03/2022 RHC: PA sat 84%; b. 05/2022 Echo: EF 60-65%, suspected ostium secundum ASD w/ predominantly L->R shunting and + bubble study.   Blood transfusion without reported diagnosis    Chest pain    a. 2006 Cath: Nl cors; b. 03/2022 Cath: Nl cors.   Chronic heart failure with preserved ejection fraction (HFpEF) ([facility])    a. 02/2019 Echo: EF 60-65%, no rwma, mildly enlarged RV, nl RV fxn, mildly dil LA; b. 03/2022 RHC:  RA 11, RV 45/5, PA 47/18 (35), PCWP 22, and LVEDP 18.   Depression    Diabetes mellitus    borderline   DJD (degenerative joint disease)    GERD (gastroesophageal reflux disease)    Hyperglycemia    Hypertension    Peripheral neuropathy    Restless leg    Syncope and collapse    Past Surgical History:  Procedure Laterality Date   APPENDECTOMY  08/27/1974   CARDIAC CATHETERIZATION  08/27/2004   @ [place]   CATARACT EXTRACTION     02/10/2022 (right eye) and 03/02/2022 (left eye)   JOINT REPLACEMENT  06/27/2005   right total knee   KNEE ARTHROSCOPY WITH MEDIAL MENISECTOMY Left 02/11/2013   Procedure: LEFT KNEE ARTHROSCOPY WITH MEDIAL MENISECTOMY;  Surgeon: Joy Cones, MD;  Location: [facility];  Service: Orthopedics;  Laterality: Left;   MULTIPLE TOOTH EXTRACTIONS     OMENTECTOMY  07/21/2012   Procedure: OMENTECTOMY;  Surgeon: Joy Pollack, MD;  Location: [facility];  Service: General;  Laterality: N/A;   right knee replacement  08/27/2006    RIGHT/LEFT HEART CATH AND CORONARY ANGIOGRAPHY Bilateral 04/04/2022   Procedure: RIGHT/LEFT HEART CATH AND CORONARY ANGIOGRAPHY;  Surgeon: Joy Ouch, MD;  Location: [facility];  Service: Cardiovascular;  Laterality: Bilateral;   TONSILLECTOMY  childhood   TUBAL LIGATION  08/27/1974   UMBILICAL HERNIA REPAIR  07/21/2012   Procedure: HERNIA REPAIR UMBILICAL ADULT;  Surgeon: Joy Pollack, MD;  Location: [facility];  Service: General;  Laterality: N/A;    Allergies  No Known Allergies  History of Present Illness    76 year old female with the above past medical history including hypertension borderline diabetes, obesity, prior tobacco abuse, GERD, peripheral neuropathy, and restless legs. She previously underwent diagnostic catheterization in July 2006 that showed no significant coronary artery disease. She was seen by Dr. Mariah [name] in 2013 for atypical chest pain that was felt to be due to increased stress and anxiety, and was managed conservatively. Echocardiogram performed through her primary care provider's office in July 2020, showed normal LV function without significant valvular abnormalities. She was referred to Dr. Kirke [name] in August 2023 in the setting of substernal chest heaviness and tightness. In addition, she noted worsening dyspnea on exertion. She underwent diagnostic catheterization in August 2023 revealing normal coronary arteries with an EF of 55 to 65%. Right heart cath showed mildly elevated filling pressures and she was noted to have a PA saturation that  was abnormally high at 84%. She was continued on Lasix 20 mg daily and at follow-up visit in early September, she had ongoing dyspnea with minimal activity and reported taking Lasix 20 mg twice daily. Spironolactone 25 mg daily was added in the setting of diastolic dysfunction and hypertension with stable labs at follow-up. A limited echocardiogram with bubble study was performed and showed normal LV function with a small  suspected ostium secundum ASD with predominantly left-to-right shunting.   [name] was last seen in cardiology clinic and mid October 2023, at which time she noted ongoing dyspnea on exertion.  Weight was down some, though she continued to have mild lower extremity swelling.  Since then, she notes ongoing stable dyspnea on exertion.  Her weight is up 1 pound since her last visit.  She has had some increase in lower extremity swelling.  Her daughter is present with her today.  She sits for long periods throughout the day.  She also enjoys salty snacks and even notes that she adds salt on top of her already salted and buttered microwave popcorn.  She has been taking Lasix 40 mg once daily and has been compliant with her other medications including carvedilol, ramipril, and spironolactone.  She has rare, fleeting episodes of chest discomfort, typically occurring at rest.  She denies palpitations, PND, orthopnea, dizziness, syncope, or early satiety.  Home Medications    Current Outpatient Medications  Medication Sig Dispense Refill   aspirin 81 MG tablet Take 81 mg by mouth daily.     Calcium Carb-Cholecalciferol (CALCIUM 600 + D PO) Take by mouth. 2 daily     carvedilol (COREG) 12.5 MG tablet Take 1 tablet (12.5 mg total) by mouth 2 (two) times daily. 60 tablet 5   esomeprazole (NEXIUM) 20 MG capsule Take 20 mg by mouth every other day.     furosemide (LASIX) 20 MG tablet Take 1-2 tablets (20-40 mg total) by mouth daily. For leg swelling. Office visit required for further refills. 180 tablet 0   ramipril (ALTACE) 5 MG capsule TAKE 1 CAPSULE (5 MG TOTAL) BY MOUTH DAILY FOR BLOOD PRESSURE. 90 capsule 2   spironolactone (ALDACTONE) 25 MG tablet Take 1 tablet (25 mg total) by mouth daily. 90 tablet 3   vitamin B-12 (CYANOCOBALAMIN) 1000 MCG tablet Take 1,000 mcg by mouth daily.     No current facility-administered medications for this visit.     Review of Systems    Ongoing dyspnea on exertion and  lower extremity swelling.  Rare and fleeting episodes of chest discomfort at rest.  She denies palpitations, PND, orthopnea, dizziness, syncope, edema, or early satiety.  All other systems reviewed and are otherwise negative except as noted above.    Physical Exam    VS:  BP 129/63 (BP Location: Left Arm, Patient Position: Sitting, Cuff Size: Normal)   Pulse 61   Ht 5\' 2"  (1.575 m)   Wt 236 lb 3.2 oz (107.1 kg)   SpO2 98%   BMI 43.20 kg/m  , BMI Body mass index is 43.2 kg/m. STOP-Bang Score:  7      GEN: Well nourished, well developed, in no acute distress. HEENT: normal. Neck: Supple obese.  Mildly elevated JVD.  No bruits or masses.   Cardiac: RRR, no murmurs, rubs, or gallops. No clubbing, cyanosis, 1-2+ bilateral lower extremity edema.  Radials 2+/PT 1+ and equal bilaterally.  Respiratory:  Respirations regular and unlabored, clear to auscultation bilaterally. GI: Obese, soft, nontender, nondistended, BS + x  4. MS: no deformity or atrophy. Skin: warm and dry, no rash. Neuro:  Strength and sensation are intact. Psych: Normal affect.  Accessory Clinical Findings     Lab Results  Component Value Date   WBC 9.6 03/27/2022   HGB 14.4 03/27/2022   HCT 43.9 03/27/2022   MCV 88.5 03/27/2022   PLT 242 03/27/2022   Lab Results  Component Value Date   CREATININE 0.99 06/12/2022   BUN 22 06/12/2022   NA 138 06/12/2022   K 4.6 06/12/2022   CL 109 06/12/2022   CO2 25 06/12/2022   Lab Results  Component Value Date   ALT 19 02/01/2022   AST 19 02/01/2022   ALKPHOS 82 02/01/2022   BILITOT 0.8 02/01/2022   Lab Results  Component Value Date   CHOL 132 02/01/2022   HDL 55.20 02/01/2022   LDLCALC 50 02/01/2022   TRIG 133.0 02/01/2022   CHOLHDL 2 02/01/2022    Lab Results  Component Value Date   HGBA1C 5.4 02/01/2022    Assessment & Plan    1.  Acute on chronic heart failure with preserved ejection fraction: Diagnostic catheterization August 2023 showed normal  coronary arteries with mildly elevated filling pressures.  Over the past 2 months, she has had some increasing lower extremity swelling with stable dyspnea on exertion.  Weight is up 1 pound since her last visit.  She has 1-2+ bilateral lower extremity edema today.  She sits for long periods of time throughout the day, and also does not use any discretion when using salt.  We discussed potentially keeping her legs elevated though this only resulted in her feet hurting.  She also does not think she can put on compression socks or Ace bandages.  I have asked her to be more careful with her salt intake and we will increase her Lasix to 40 mg twice daily over the next week with a plan to see her back next week and follow-up a basic metabolic panel at that time.  She remains on carvedilol, ramipril, and spironolactone.  As previously noted, SGLT2 inhibitor and GLP-1 agonists were felt to be cost prohibitive.  2.  Essential hypertension: Blood pressure much stable on beta-blocker, spironolactone, ramipril, and Lasix.  3.  Sleep disordered breathing: Patient previously referred to pulmonology for sleep eval however, per referral note, she refused appointment.  4.  Atrial septal defect: PA saturation of 84% on right heart catheterization with small ASD and left-to-right shunting noted on recent follow-up echo.  Ongoing dyspnea on exertion though overall, this is stable.  I suspect she is carrying some excess volume which will improve with diuretic escalation as outlined above.  Patient prefers to avoid any additional procedures such as TEE to better evaluate ASD, and therefore, we will continue to hold off on pursuing this unless dyspnea becomes significantly lifestyle limiting.  5.  Precordial chest pain: Still experiences occasional discomfort in her chest, typically at rest, once a week or last.  This lasts a few minutes and resolve spontaneously.  Previous evaluated with diagnostic catheterization which showed  normal coronary arteries in August of this year.  6.  Disposition: Follow-up in 1 week to reevaluate volume status and follow-up basic metabolic panel at that time.  Nicolasa Ducking, NP 08/01/2022, 3:52 PM

## 2022-08-01 NOTE — Patient Instructions (Addendum)
Medication Instructions:  Increase lasix to 40 mg twice daily for 1 wk and then drop back down to 40 mg daily (new Rx sent to pharmacy w/ 40 mg tablets). *If you need a refill on your cardiac medications before your next appointment, please call your pharmacy*   Lab Work: None If you have labs (blood work) drawn today and your tests are completely normal, you will receive your results only by: MyChart Message (if you have MyChart) OR A paper copy in the mail If you have any lab test that is abnormal or we need to change your treatment, we will call you to review the results.   Testing/Procedures: None   Follow-Up: At [facility], you and your health needs are our priority.  As part of our continuing mission to provide you with exceptional heart care, we have created designated Provider Care Teams.  These Care Teams include your primary Cardiologist (physician) and Advanced Practice Providers (APPs -  Physician Assistants and Nurse Practitioners) who all work together to provide you with the care you need, when you need it.  We recommend signing up for the patient portal called "MyChart".  Sign up information is provided on this After Visit Summary.  MyChart is used to connect with patients for Virtual Visits (Telemedicine).  Patients are able to view lab/test results, encounter notes, upcoming appointments, etc.  Non-urgent messages can be sent to your provider as well.   To learn more about what you can do with MyChart, go to [email or web address].    Your next appointment:   1 week(s)  The format for your next appointment:   In Person  Provider:   Nicolasa Ducking, NP    Other Instructions Weigh yourself daily.  Monitor salt intake carefully and reduce wherever you can.  Important Information About Sugar

## 2022-08-08 ENCOUNTER — Ambulatory Visit: Payer: Medicare HMO | Attending: Nurse Practitioner | Admitting: Nurse Practitioner

## 2022-08-08 ENCOUNTER — Encounter: Payer: Self-pay | Admitting: Nurse Practitioner

## 2022-08-08 VITALS — BP 106/66 | HR 73 | Ht 62.0 in | Wt 234.0 lb

## 2022-08-08 DIAGNOSIS — Q211 Atrial septal defect, unspecified: Secondary | ICD-10-CM | POA: Diagnosis not present

## 2022-08-08 DIAGNOSIS — G4730 Sleep apnea, unspecified: Secondary | ICD-10-CM

## 2022-08-08 DIAGNOSIS — I5032 Chronic diastolic (congestive) heart failure: Secondary | ICD-10-CM

## 2022-08-08 DIAGNOSIS — R072 Precordial pain: Secondary | ICD-10-CM

## 2022-08-08 DIAGNOSIS — I10 Essential (primary) hypertension: Secondary | ICD-10-CM | POA: Diagnosis not present

## 2022-08-08 MED ORDER — FUROSEMIDE 40 MG PO TABS: ORAL_TABLET | ORAL | 6 refills | Status: DC

## 2022-08-08 NOTE — Progress Notes (Signed)
Office Visit    Patient Name: [name] Date of Encounter: 08/08/2022  Primary Care Provider:  Pleas Koch, NP Primary Cardiologist:  Kathlyn Sacramento, MD  Chief Complaint    76 year old female with history of hypertension, ASD, borderline diabetes, obesity, prior tobacco abuse, GERD, peripheral neuropathy, and restless leg, who presents for HFpEF follow-up.  Past Medical History    Past Medical History:  Diagnosis Date   Acute meniscal tear of left knee 01/19/2013   Atrial septal defect    a. 03/2022 RHC: PA sat 84%; b. 05/2022 Echo: EF 60-65%, suspected ostium secundum ASD w/ predominantly L->R shunting and + bubble study.   Blood transfusion without reported diagnosis    Chest pain    a. 2006 Cath: Nl cors; b. 03/2022 Cath: Nl cors.   Chronic heart failure with preserved ejection fraction (HFpEF) ([place])    a. 02/2019 Echo: EF 60-65%, no rwma, mildly enlarged RV, nl RV fxn, mildly dil LA; b. 03/2022 RHC:  RA 11, RV 45/5, PA 47/18 (35), PCWP 22, and LVEDP 18.   Depression    Diabetes mellitus    borderline   DJD (degenerative joint disease)    GERD (gastroesophageal reflux disease)    Hyperglycemia    Hypertension    Peripheral neuropathy    Restless leg    Syncope and collapse    Past Surgical History:  Procedure Laterality Date   APPENDECTOMY  08/27/1974   CARDIAC CATHETERIZATION  08/27/2004   @ [place]   CATARACT EXTRACTION     02/10/2022 (right eye) and 03/02/2022 (left eye)   JOINT REPLACEMENT  06/27/2005   right total knee   KNEE ARTHROSCOPY WITH MEDIAL MENISECTOMY Left 02/11/2013   Procedure: LEFT KNEE ARTHROSCOPY WITH MEDIAL MENISECTOMY;  Surgeon: Tobi Bastos, MD;  Location: [facility];  Service: Orthopedics;  Laterality: Left;   MULTIPLE TOOTH EXTRACTIONS     OMENTECTOMY  07/21/2012   Procedure: OMENTECTOMY;  Surgeon: Odis Hollingshead, MD;  Location: [facility];  Service: General;  Laterality: N/A;   right knee replacement  08/27/2006    RIGHT/LEFT HEART CATH AND CORONARY ANGIOGRAPHY Bilateral 04/04/2022   Procedure: RIGHT/LEFT HEART CATH AND CORONARY ANGIOGRAPHY;  Surgeon: Wellington Hampshire, MD;  Location: [facility];  Service: Cardiovascular;  Laterality: Bilateral;   TONSILLECTOMY  childhood   TUBAL LIGATION  [id number]   UMBILICAL HERNIA REPAIR  07/21/2012   Procedure: HERNIA REPAIR UMBILICAL ADULT;  Surgeon: Odis Hollingshead, MD;  Location: [facility];  Service: General;  Laterality: N/A;    Allergies  No Known Allergies  History of Present Illness    76 year old female with above past medical history including hypertension, borderline diabetes, obesity, prior tobacco abuse, GERD, peripheral neuropathy, and restless legs.  She underwent diagnostic catheterization in July 2006 that showed no significant coronary artery disease.  She was seen by Dr. Rockey Situ in 2013 for atypical chest pain that was felt to be due to increased stress and anxiety, and was managed conservatively.  Echo in July 2020 showed normal EF without significant valvular abnormalities.  She was referred to Dr. Fletcher Anon in August 2023 in the setting of worsening dyspnea on exertion and chest heaviness.  Diagnostic catheterization showed normal coronary arteries with an EF of 55 to 60%.  Right heart cath showed mildly elevated filling pressures and she was noted to have a PA saturation that was abnormally high at 84%.  She was maintained on Lasix therapy and spironolactone 25 mg  daily was subsequently added.  A follow-up limited echo with bubble study showed normal LV function with a small suspected ostium secundum ASD with predominantly left-to-right shunting.  [name] was last seen in clinic on December 6, at which time she had increased lower extremity swelling and dyspnea on exertion.  She admitted to frequent salty snacks (salted and buttered microwave popcorn).  I asked her to increase her Lasix to 40 mg twice daily with plan for follow-up today.  Over  the past week, she noted slight increase in urine output.  Her weight is down 2 pounds from last week.  She has also been wearing a compression sock with improvement in lower extremity edema.  She has not really noticed any change in her dyspnea on exertion however, also has not challenged herself much.  She has reduced some of her salt intake though admits to ongoing additional salt usage.  She denies chest pain, palpitations, PND, orthopnea, dizziness, syncope, or early satiety.  Home Medications    Current Outpatient Medications  Medication Sig Dispense Refill   aspirin 81 MG tablet Take 81 mg by mouth daily.     Calcium Carb-Cholecalciferol (CALCIUM 600 + D PO) Take by mouth. 2 daily     carvedilol (COREG) 12.5 MG tablet Take 1 tablet (12.5 mg total) by mouth 2 (two) times daily. 60 tablet 5   esomeprazole (NEXIUM) 20 MG capsule Take 20 mg by mouth every other day.     ramipril (ALTACE) 5 MG capsule TAKE 1 CAPSULE (5 MG TOTAL) BY MOUTH DAILY FOR BLOOD PRESSURE. 90 capsule 2   spironolactone (ALDACTONE) 25 MG tablet Take 1 tablet (25 mg total) by mouth daily. 90 tablet 3   vitamin B-12 (CYANOCOBALAMIN) 1000 MCG tablet Take 1,000 mcg by mouth daily.     furosemide (LASIX) 40 MG tablet Take 0.5 tablet (20 mg) by mouth twice daily. You may take 1 tablet (40 mg) by mouth twice daily as needed for weight gain of 2 lbs overnight or 5 lbs in 1 week 60 tablet 6   No current facility-administered medications for this visit.     Review of Systems    Chronic, stable dyspnea on exertion.  Some improvement in lower extremity edema.  She denies chest pain, palpitations, PND, orthopnea, dizziness, syncope, or early satiety.  All other systems reviewed and are otherwise negative except as noted above.    Physical Exam    VS:  BP 106/66 (BP Location: Left Arm, Patient Position: Sitting, Cuff Size: Large)   Pulse 73   Ht 5\' 2"  (1.575 m)   Wt 234 lb (106.1 kg)   SpO2 96%   BMI 42.80 kg/m  , BMI Body  mass index is 42.8 kg/m. STOP-Bang Score:  7      GEN: Well nourished, well developed, in no acute distress. HEENT: normal. Neck: Supple, no JVD, carotid bruits, or masses. Cardiac: RRR, no murmurs, rubs, or gallops. No clubbing, cyanosis, trace bilateral lower extremity edema.  Radials 2+/PT 2+ and equal bilaterally.  Respiratory:  Respirations regular and unlabored, clear to auscultation bilaterally. GI: Soft, nontender, nondistended, BS + x 4. MS: no deformity or atrophy. Skin: warm and dry, no rash. Neuro:  Strength and sensation are intact. Psych: Normal affect.  Accessory Clinical Findings    Lab Results  Component Value Date   WBC 9.6 03/27/2022   HGB 14.4 03/27/2022   HCT 43.9 03/27/2022   MCV 88.5 03/27/2022   PLT 242 03/27/2022   Lab  Results  Component Value Date   CREATININE 0.99 06/12/2022   BUN 22 06/12/2022   NA 138 06/12/2022   K 4.6 06/12/2022   CL 109 06/12/2022   CO2 25 06/12/2022   Lab Results  Component Value Date   ALT 19 02/01/2022   AST 19 02/01/2022   ALKPHOS 82 02/01/2022   BILITOT 0.8 02/01/2022   Lab Results  Component Value Date   CHOL 132 02/01/2022   HDL 55.20 02/01/2022   LDLCALC 50 02/01/2022   TRIG 133.0 02/01/2022   CHOLHDL 2 02/01/2022    Lab Results  Component Value Date   HGBA1C 5.4 02/01/2022    Assessment & Plan    1.  Chronic heart failure with preserved ejection fraction: Diagnostic catheterization in August 2023, showed normal coronary arteries with mildly elevated filling pressures.  At visit last week, she complained of increasing lower extremity edema and dyspnea exertion.  Weight was only up 1 pound but she did have 1-2+ bilateral lower extremity edema.  I increased her Lasix to 40 mg twice daily at that time and encouraged her to keep her legs elevated, watch her salt, and use compression socks.  She has been using compression socks but not necessarily the other 2.  Regardless, swelling has improved today with only  trace ankle edema, and weight is down 2 pounds.  Her daughter is present with her today.  We agreed that as long as she weighs herself daily and keep close attention to her weight, she can go back to Lasix 20 mg twice daily (or 40 mg daily if that is more convenient), with an additional 40 mg daily as needed for weight gain of greater than 2 pounds in 24 hours or 5 pounds in the course of the week.  She otherwise remains on beta-blocker, ACE inhibitor, and spironolactone.  Patient has previously stated that SGLT2 inhibitors and GLP-1 agonists would be cost prohibitive.   2.  Essential hypertension: Stable.  Continue current regimen.  3.  Sleep disordered breathing: Previously referred to pulmonology for sleep eval however, she refused appointment.  4.  Atrial septal defect.  PA saturation of 84% on right heart catheterization with small ASD and left-to-right shunting noted on recent follow-up echo.  Ongoing dyspnea exertion though overall stable.  As previously outlined, her preference is to avoid any additional procedures such as TEE to better evaluate ASD unless she has worsening symptoms.  5.  Precordial chest pain: She denies any recurrent chest pain today.  Prior diagnostic catheterization showed normal coronary arteries in August 2023.  6.  Disposition: Follow-up in 2 to 3 months or sooner if necessary.  Murray Hodgkins, NP 08/08/2022, 5:43 PM

## 2022-08-08 NOTE — Patient Instructions (Signed)
Medication Instructions:  - Your physician has recommended you make the following change in your medication:   1) DECREASE Lasix (furosemide) 40 mg: Take 0.5 tablet (20 mg) by mouth TWICE daily You may take 1 tablet (40 mg) TWICE daily as needed for weight gain of 2 lbs or more in 24 hours or 5 lbs in 1 week  *If you need a refill on your cardiac medications before your next appointment, please call your pharmacy*   Lab Work: - none ordered  If you have labs (blood work) drawn today and your tests are completely normal, you will receive your results only by: MyChart Message (if you have MyChart) OR A paper copy in the mail If you have any lab test that is abnormal or we need to change your treatment, we will call you to review the results.   Testing/Procedures: - none ordered   Follow-Up: At [facility], you and your health needs are our priority.  As part of our continuing mission to provide you with exceptional heart care, we have created designated Provider Care Teams.  These Care Teams include your primary Cardiologist (physician) and Advanced Practice Providers (APPs -  Physician Assistants and Nurse Practitioners) who all work together to provide you with the care you need, when you need it.  We recommend signing up for the patient portal called "MyChart".  Sign up information is provided on this After Visit Summary.  MyChart is used to connect with patients for Virtual Visits (Telemedicine).  Patients are able to view lab/test results, encounter notes, upcoming appointments, etc.  Non-urgent messages can be sent to your provider as well.   To learn more about what you can do with MyChart, go to [email or web address].    Your next appointment:   2 month(s)  The format for your next appointment:   In Person  Provider:   You may see [name], MD or one of the following Advanced Practice Providers on your designated Care Team:   Nicolasa Ducking,  NP   Other Instructions N/a  Important Information About Sugar

## 2022-09-18 ENCOUNTER — Other Ambulatory Visit: Payer: Self-pay | Admitting: Cardiovascular Disease

## 2022-10-10 ENCOUNTER — Ambulatory Visit: Payer: Medicare HMO | Attending: Nurse Practitioner | Admitting: Nurse Practitioner

## 2022-10-10 ENCOUNTER — Encounter: Payer: Self-pay | Admitting: Nurse Practitioner

## 2022-10-10 VITALS — BP 118/64 | HR 55 | Ht 62.5 in | Wt 232.4 lb

## 2022-10-10 DIAGNOSIS — I5032 Chronic diastolic (congestive) heart failure: Secondary | ICD-10-CM | POA: Diagnosis not present

## 2022-10-10 DIAGNOSIS — Q211 Atrial septal defect, unspecified: Secondary | ICD-10-CM

## 2022-10-10 DIAGNOSIS — G4730 Sleep apnea, unspecified: Secondary | ICD-10-CM | POA: Diagnosis not present

## 2022-10-10 DIAGNOSIS — I10 Essential (primary) hypertension: Secondary | ICD-10-CM

## 2022-10-10 NOTE — Patient Instructions (Signed)
Medication Instructions:  Your physician recommends that you continue on your current medications as directed. Please refer to the Current Medication list given to you today.  *If you need a refill on your cardiac medications before your next appointment, please call your pharmacy*   Lab Work: - None ordered If you have labs (blood work) drawn today and your tests are completely normal, you will receive your results only by: [place] (if you have MyChart) OR A paper copy in the mail If you have any lab test that is abnormal or we need to change your treatment, we will call you to review the results.   Testing/Procedures:  - None ordered   Follow-Up: At [facility], you and your health needs are our priority.  As part of our continuing mission to provide you with exceptional heart care, we have created designated Provider Care Teams.  These Care Teams include your primary Cardiologist (physician) and Advanced Practice Providers (APPs -  Physician Assistants and Nurse Practitioners) who all work together to provide you with the care you need, when you need it.  We recommend signing up for the patient portal called "MyChart".  Sign up information is provided on this After Visit Summary.  MyChart is used to connect with patients for Virtual Visits (Telemedicine).  Patients are able to view lab/test results, encounter notes, upcoming appointments, etc.  Non-urgent messages can be sent to your provider as well.   To learn more about what you can do with MyChart, go to [email or web address].    Your next appointment:   3 month(s)  Provider:   You may see Kathlyn Sacramento, MD or one of the following Advanced Practice Providers on your designated Care Team:   Murray Hodgkins, NP  Other Instructions - None

## 2022-10-10 NOTE — Progress Notes (Signed)
Office Visit    Patient Name: [name] Date of Encounter: 10/10/2022  Primary Care Provider:  Pleas Koch, NP Primary Cardiologist:  Kathlyn Sacramento, MD  Chief Complaint    77 y/o ? w/ a h/o HTN, ASD, borderline DM, obesity, prior tob abuse, GERD, perhipheral neuropathy, and restless legs, who presents for CHF f/u.  Past Medical History    Past Medical History:  Diagnosis Date   Acute meniscal tear of left knee 01/19/2013   Atrial septal defect    a. 03/2022 RHC: PA sat 84%; b. 05/2022 Echo: EF 60-65%, suspected ostium secundum ASD w/ predominantly L->R shunting and + bubble study.   Blood transfusion without reported diagnosis    Chest pain    a. 2006 Cath: Nl cors; b. 03/2022 Cath: Nl cors.   Chronic heart failure with preserved ejection fraction (HFpEF) ([place])    a. 02/2019 Echo: EF 60-65%, no rwma, mildly enlarged RV, nl RV fxn, mildly dil LA; b. 03/2022 RHC:  RA 11, RV 45/5, PA 47/18 (35), PCWP 22, and LVEDP 18.   Depression    Diabetes mellitus    borderline   DJD (degenerative joint disease)    GERD (gastroesophageal reflux disease)    Hyperglycemia    Hypertension    Peripheral neuropathy    Restless leg    Syncope and collapse    Past Surgical History:  Procedure Laterality Date   APPENDECTOMY  08/27/1974   CARDIAC CATHETERIZATION  08/27/2004   @ [place]   CATARACT EXTRACTION     02/10/2022 (right eye) and 03/02/2022 (left eye)   JOINT REPLACEMENT  06/27/2005   right total knee   KNEE ARTHROSCOPY WITH MEDIAL MENISECTOMY Left 02/11/2013   Procedure: LEFT KNEE ARTHROSCOPY WITH MEDIAL MENISECTOMY;  Surgeon: Tobi Bastos, MD;  [facility]: [facility];  Service: Orthopedics;  Laterality: Left;   MULTIPLE TOOTH EXTRACTIONS     OMENTECTOMY  07/21/2012   Procedure: OMENTECTOMY;  Surgeon: Odis Hollingshead, MD;  Location: [facility];  Service: General;  Laterality: N/A;   right knee replacement  08/27/2006   RIGHT/LEFT HEART CATH AND CORONARY ANGIOGRAPHY  Bilateral 04/04/2022   Procedure: RIGHT/LEFT HEART CATH AND CORONARY ANGIOGRAPHY;  Surgeon: Wellington Hampshire, MD;  Location: [facility];  Service: Cardiovascular;  Laterality: Bilateral;   TONSILLECTOMY  childhood   TUBAL LIGATION  [id number]   UMBILICAL HERNIA REPAIR  07/21/2012   Procedure: HERNIA REPAIR UMBILICAL ADULT;  Surgeon: Odis Hollingshead, MD;  Location: [facility];  Service: General;  Laterality: N/A;    Allergies  No Known Allergies  History of Present Illness    77 year old female with the above past medical history including hypertension, borderline diabetes, obesity, prior tobacco abuse, GERD, peripheral neuropathy, and restless legs.  She underwent diagnostic catheterization July 2006, which showed no significant coronary artery disease.  She established with Dr. Rockey Situ in 2013 for atypical chest pain that was felt to be due to increased stress and anxiety, and was managed conservatively.  Echocardiogram in July 2020 showed normal LV function without significant valvular abnormalities.  She was referred to Dr. Fletcher Anon in August 2023 in the setting of worsening dyspnea on exertion and chest heaviness.  Diagnostic catheterization showed normal coronary arteries with an EF of 55 to 60%.  Right heart catheter mildly elevated filling pressures and she was noted to have a PA saturation that was abnormally high at 84%.  She was maintained on Lasix therapy and spironolactone was added.  Follow-up limited echo with bubble study showed normal LV function with a small suspected ostium secundum ASD with predominantly left-to-right shunting.  In the setting of stable symptoms, patient prefers to avoid TEE.  Ms. Martzall was last seen in cardiology clinic in mid December 2023 after requiring a brief increase in Lasix dosing due to lower extremity edema in the setting of dietary indiscretion.  Since then, she notes that she has done reasonably well.  Her weight is down close to 2 pounds from  her last visit.  She notes chronic, stable, mild ankle swelling and has not had to take any additional Lasix, though she does not weigh herself daily.  She has chronic, stable dyspnea on exertion and fatigue, especially after long shopping trips.  She denies chest pain, palpitations, PND, orthopnea, dizziness, syncope, or early satiety  Home Medications    Current Outpatient Medications  Medication Sig Dispense Refill   aspirin 81 MG tablet Take 81 mg by mouth daily.     Calcium Carb-Cholecalciferol (CALCIUM 600 + D PO) Take by mouth. 2 daily     carvedilol (COREG) 12.5 MG tablet TAKE 1 TABLET BY MOUTH 2 TIMES DAILY. **STOP METOPROLOL** 180 tablet 1   esomeprazole (NEXIUM) 20 MG capsule Take 20 mg by mouth every other day.     furosemide (LASIX) 40 MG tablet Take 0.5 tablet (20 mg) by mouth twice daily. You may take 1 tablet (40 mg) by mouth twice daily as needed for weight gain of 2 lbs overnight or 5 lbs in 1 week (Patient taking differently: Take 40 mg by mouth daily. Take 0.5 tablet (20 mg) by mouth twice daily. You may take 1 tablet (40 mg) by mouth twice daily as needed for weight gain of 2 lbs overnight or 5 lbs in 1 week) 60 tablet 6   ramipril (ALTACE) 5 MG capsule TAKE 1 CAPSULE (5 MG TOTAL) BY MOUTH DAILY FOR BLOOD PRESSURE. 90 capsule 2   spironolactone (ALDACTONE) 25 MG tablet Take 1 tablet (25 mg total) by mouth daily. 90 tablet 3   vitamin B-12 (CYANOCOBALAMIN) 1000 MCG tablet Take 1,000 mcg by mouth daily.     No current facility-administered medications for this visit.     Review of Systems    Chronic, stable dyspnea on exertion and fatigue.  Some degree of chronic, mild ankle swelling.  She denies chest pain, palpitations, PND, orthopnea, dizziness, syncope, or early satiety.  All other systems reviewed and are otherwise negative except as noted above.    Physical Exam    VS:  BP 118/64 (BP Location: Left Arm, Patient Position: Sitting, Cuff Size: Large)   Pulse (!) 55    Ht 5' 2.5" (1.588 m)   Wt 232 lb 6.4 oz (105.4 kg)   SpO2 97%   BMI 41.83 kg/m  , BMI Body mass index is 41.83 kg/m. STOP-Bang Score:  7      GEN: Well nourished, well developed, in no acute distress. HEENT: normal. Neck: Supple, no JVD, carotid bruits, or masses. Cardiac: RRR, no murmurs, rubs, or gallops. No clubbing, cyanosis, trace bilateral lower extremity edema.  Radials 2+/PT 2+ and equal bilaterally.  Respiratory:  Respirations regular and unlabored, clear to auscultation bilaterally. GI: Soft, nontender, nondistended, BS + x 4. MS: no deformity or atrophy. Skin: warm and dry, no rash. Neuro:  Strength and sensation are intact. Psych: Normal affect.  Accessory Clinical Findings    Lab Results  Component Value Date   WBC 9.6 03/27/2022  HGB 14.4 03/27/2022   HCT 43.9 03/27/2022   MCV 88.5 03/27/2022   PLT 242 03/27/2022   Lab Results  Component Value Date   CREATININE 0.99 06/12/2022   BUN 22 06/12/2022   NA 138 06/12/2022   K 4.6 06/12/2022   CL 109 06/12/2022   CO2 25 06/12/2022   Lab Results  Component Value Date   ALT 19 02/01/2022   AST 19 02/01/2022   ALKPHOS 82 02/01/2022   BILITOT 0.8 02/01/2022   Lab Results  Component Value Date   CHOL 132 02/01/2022   HDL 55.20 02/01/2022   LDLCALC 50 02/01/2022   TRIG 133.0 02/01/2022   CHOLHDL 2 02/01/2022    Lab Results  Component Value Date   HGBA1C 5.4 02/01/2022    Assessment & Plan    1.  Chronic heart failure with preserved ejection fraction: Diagnostic catheterization August 2023 showed normal coronary arteries with mildly elevated filling pressures.  Over the past 2 months, she has been relatively stable with only trace ankle swelling and chronic, stable dyspnea on exertion.  She is currently taking Lasix 40 mg daily and has not had to take any additional Lasix since her last visit.  She does not weigh herself at home and I did encourage her to replace her scale.  Heart rate and blood pressure  stable.  No changes to medications today.  Stable lab work in October.  Patient previously stated that SGLT2 inhibitors and GLP-1 agonist would be cost prohibitive.  2.  Essential hypertension: Stable.  Continue current regimen.  3.  Sleep disordered breathing: Previously referred for pulmonology eval however patient refused appointment.  4.  Atrial septal defect: PA saturation of 84% on right heart catheterization in 03/2022, with small ASD and left-to-right shunting noted on follow-up echo.  Patient is symptomatically stable and prefers conservative management at this time.  Consider TEE for progressive symptoms in the future.  5.  Disposition: Follow-up in clinic in 3 months or sooner if necessary.  Murray Hodgkins, NP 10/10/2022, 4:55 PM

## 2022-11-13 ENCOUNTER — Ambulatory Visit: Payer: Medicare HMO | Attending: Cardiovascular Disease

## 2022-11-13 DIAGNOSIS — R0602 Shortness of breath: Secondary | ICD-10-CM

## 2022-11-14 LAB — ECHOCARDIOGRAM COMPLETE
AV Mean grad: 3 mmHg
AV Peak grad: 6.1 mmHg
Ao pk vel: 1.23 m/s
Area-P 1/2: 3.48 cm2
Calc EF: 69.7 %
S' Lateral: 2.9 cm
Single Plane A2C EF: 75.9 %
Single Plane A4C EF: 66.9 %

## 2022-12-07 ENCOUNTER — Other Ambulatory Visit: Payer: Self-pay | Admitting: Primary Care

## 2022-12-07 DIAGNOSIS — I10 Essential (primary) hypertension: Secondary | ICD-10-CM

## 2022-12-07 NOTE — Telephone Encounter (Signed)
Patient is due for CPE/follow up in mid June, this will be required prior to any further refills.  Please schedule, thank you!   

## 2022-12-07 NOTE — Telephone Encounter (Signed)
Patient has been scheduled

## 2022-12-10 ENCOUNTER — Other Ambulatory Visit: Payer: Medicare HMO

## 2023-01-03 DIAGNOSIS — Z0000 Encounter for general adult medical examination without abnormal findings: Secondary | ICD-10-CM

## 2023-01-03 NOTE — Progress Notes (Signed)
AWV not done today. pt stated some one came to the house last week and completed it. Pt agreed to schedule next AWV with [facility]. Appointment scheduled.   C.Sonora Catlin LPN

## 2023-01-10 ENCOUNTER — Ambulatory Visit: Payer: Medicare HMO | Attending: Cardiovascular Disease | Admitting: Cardiovascular Disease

## 2023-01-10 ENCOUNTER — Encounter: Payer: Self-pay | Admitting: Cardiovascular Disease

## 2023-01-10 VITALS — BP 110/64 | HR 71 | Ht 63.0 in | Wt 234.0 lb

## 2023-01-10 DIAGNOSIS — R072 Precordial pain: Secondary | ICD-10-CM | POA: Diagnosis not present

## 2023-01-10 DIAGNOSIS — I10 Essential (primary) hypertension: Secondary | ICD-10-CM | POA: Diagnosis not present

## 2023-01-10 DIAGNOSIS — I5032 Chronic diastolic (congestive) heart failure: Secondary | ICD-10-CM | POA: Diagnosis not present

## 2023-01-10 MED ORDER — FUROSEMIDE 40 MG PO TABS: 40.0000 mg | ORAL_TABLET | Freq: Every day | ORAL | 1 refills | Status: DC

## 2023-01-10 NOTE — Patient Instructions (Signed)
Medication Instructions:  FUROSEMIDE: Take 40 mg once daily  *If you need a refill on your cardiac medications before your next appointment, please call your pharmacy*   Lab Work: None ordered If you have labs (blood work) drawn today and your tests are completely normal, you will receive your results only by: MyChart Message (if you have MyChart) OR A paper copy in the mail If you have any lab test that is abnormal or we need to change your treatment, we will call you to review the results.   Testing/Procedures: None ordered   Follow-Up: At [facility], you and your health needs are our priority.  As part of our continuing mission to provide you with exceptional heart care, we have created designated Provider Care Teams.  These Care Teams include your primary Cardiologist (physician) and Advanced Practice Providers (APPs -  Physician Assistants and Nurse Practitioners) who all work together to provide you with the care you need, when you need it.  We recommend signing up for the patient portal called "MyChart".  Sign up information is provided on this After Visit Summary.  MyChart is used to connect with patients for Virtual Visits (Telemedicine).  Patients are able to view lab/test results, encounter notes, upcoming appointments, etc.  Non-urgent messages can be sent to your provider as well.   To learn more about what you can do with MyChart, go to [email or web address].    Your next appointment:   6 month(s)  Provider:   You may see [name], MD or one of the following Advanced Practice Providers on your designated Care Team:   Nicolasa Ducking, NP Eula Listen, PA-C Cadence Fransico Michael, PA-C Charlsie Quest, NP

## 2023-01-10 NOTE — Progress Notes (Signed)
Cardiology Office Note   Date:  01/10/2023   ID:  [name], DOB May 07, 1946, MRN [id number]  PCP:  Doreene Nest, NP  Cardiologist:   [name], MD   Chief Complaint  Patient presents with   Follow-up    3 month f/u c/o chest pain. Meds reviewed verbally with pt.      History of Present Illness: [name] is a 77 y.o. female who is here today for follow-up visit regarding chronic diastolic heart failure.    She has chronic medical conditions that include essential hypertension, obesity, previous tobacco use, borderline diabetes mellitus, GERD, peripheral neuropathy and restless legs.  She had previous cardiac catheterization July 2006 that showed no significant coronary artery disease. She was seen by Dr. Mariah Milling in 2013 for atypical chest pain that was felt to be due to increased stress and anxiety. She was seen in August 2023 for worsening exertional dyspnea and chest heaviness.  Cardiac catheterization was performed which showed normal coronary arteries with an ejection fraction of 55 to 60%.  Right heart catheterization showed mildly elevated filling pressures with elevated PA saturation at 84%.  Follow-up limited echo with bubble study showed normal LV systolic function with likely small ASD. She has been doing reasonably well.  She continues to complain of intermittent episodes of chest pain described as burning sensation with some sharp discomfort.  She has chronic exertional dyspnea.  Past Medical History:  Diagnosis Date   Acute meniscal tear of left knee 01/19/2013   Atrial septal defect    a. 03/2022 RHC: PA sat 84%; b. 05/2022 Echo: EF 60-65%, suspected ostium secundum ASD w/ predominantly L->R shunting and + bubble study.   Blood transfusion without reported diagnosis    Chest pain    a. 2006 Cath: Nl cors; b. 03/2022 Cath: Nl cors.   Chronic heart failure with preserved ejection fraction (HFpEF) (HCC)    a. 02/2019 Echo: EF 60-65%, no rwma,  mildly enlarged RV, nl RV fxn, mildly dil LA; b. 03/2022 RHC:  RA 11, RV 45/5, PA 47/18 (35), PCWP 22, and LVEDP 18.   Depression    Diabetes mellitus    borderline   DJD (degenerative joint disease)    GERD (gastroesophageal reflux disease)    Hyperglycemia    Hypertension    Peripheral neuropathy    Restless leg    Syncope and collapse     Past Surgical History:  Procedure Laterality Date   APPENDECTOMY  08/27/1974   CARDIAC CATHETERIZATION  08/27/2004   @ [address]   CATARACT EXTRACTION     02/10/2022 (right eye) and 03/02/2022 (left eye)   JOINT REPLACEMENT  06/27/2005   right total knee   KNEE ARTHROSCOPY WITH MEDIAL MENISECTOMY Left 02/11/2013   Procedure: LEFT KNEE ARTHROSCOPY WITH MEDIAL MENISECTOMY;  Surgeon: Jacki Cones, MD;  Location: [facility];  Service: Orthopedics;  Laterality: Left;   MULTIPLE TOOTH EXTRACTIONS     OMENTECTOMY  07/21/2012   Procedure: OMENTECTOMY;  Surgeon: Adolph Pollack, MD;  Location: [facility];  Service: General;  Laterality: N/A;   right knee replacement  08/27/2006   RIGHT/LEFT HEART CATH AND CORONARY ANGIOGRAPHY Bilateral 04/04/2022   Procedure: RIGHT/LEFT HEART CATH AND CORONARY ANGIOGRAPHY;  Surgeon: Iran Ouch, MD;  Location: [facility];  Service: Cardiovascular;  Laterality: Bilateral;   TONSILLECTOMY  childhood   TUBAL LIGATION  08/27/1974   UMBILICAL HERNIA REPAIR  07/21/2012   Procedure: HERNIA REPAIR UMBILICAL  ADULT;  Surgeon: Adolph Pollack, MD;  Location: [facility];  Service: General;  Laterality: N/A;     Current Outpatient Medications  Medication Sig Dispense Refill   aspirin 81 MG tablet Take 81 mg by mouth daily.     Calcium Carb-Cholecalciferol (CALCIUM 600 + D PO) Take by mouth. 2 daily     carvedilol (COREG) 12.5 MG tablet TAKE 1 TABLET BY MOUTH 2 TIMES DAILY. **STOP METOPROLOL** 180 tablet 1   esomeprazole (NEXIUM) 20 MG capsule Take 20 mg by mouth every other day.     furosemide (LASIX) 40 MG tablet  Take 0.5 tablet (20 mg) by mouth twice daily. You may take 1 tablet (40 mg) by mouth twice daily as needed for weight gain of 2 lbs overnight or 5 lbs in 1 week (Patient taking differently: Take 40 mg by mouth 2 (two) times daily. Take 0.5 tablet (20 mg) by mouth twice daily. You may take 1 tablet (40 mg) by mouth twice daily as needed for weight gain of 2 lbs overnight or 5 lbs in 1 week) 60 tablet 6   Multiple Vitamins-Minerals (PRESERVISION AREDS 2 PO) Take by mouth daily in the afternoon.     ramipril (ALTACE) 5 MG capsule TAKE 1 CAPSULE (5 MG TOTAL) BY MOUTH DAILY FOR BLOOD PRESSURE. 90 capsule 0   spironolactone (ALDACTONE) 25 MG tablet Take 1 tablet (25 mg total) by mouth daily. 90 tablet 3   vitamin B-12 (CYANOCOBALAMIN) 1000 MCG tablet Take 1,000 mcg by mouth daily.     No current facility-administered medications for this visit.    Allergies:   Patient has no known allergies.    Social History:  The patient  reports that she has quit smoking. Her smoking use included cigarettes. She has a 20.00 pack-year smoking history. She has never used smokeless tobacco. She reports current alcohol use. She reports that she does not use drugs.   Family History:  The patient's family history includes AAA (abdominal aortic aneurysm) in her father; Heart attack in her father; Heart disease in her father; Hypertension in her father; Parkinsonism in her mother.    ROS:  Please see the history of present illness.   Otherwise, review of systems are positive for none.   All other systems are reviewed and negative.    PHYSICAL EXAM: VS:  BP 110/64 (BP Location: Right Arm, Patient Position: Sitting, Cuff Size: Large)   Pulse 71   Ht 5\' 3"  (1.6 m)   Wt 234 lb (106.1 kg)   SpO2 98%   BMI 41.45 kg/m  , BMI Body mass index is 41.45 kg/m. GEN: Well nourished, well developed, in no acute distress  HEENT: normal  Neck: no JVD, carotid bruits, or masses Cardiac: RRR; no murmurs, rubs, or gallops, mild  bilateral lymphedema Respiratory:  clear to auscultation bilaterally, normal work of breathing GI: soft, nontender, nondistended, + BS MS: no deformity or atrophy  Skin: warm and dry, no rash Neuro:  Strength and sensation are intact Psych: euthymic mood, full affect   EKG:  EKG is ordered today. The ekg ordered today demonstrates normal sinus rhythm with left anterior fascicular block.   Recent Labs: 02/01/2022: ALT 19; Pro B Natriuretic peptide (BNP) 226.0 03/27/2022: Hemoglobin 14.4; Platelets 242 06/12/2022: BUN 22; Creatinine, Ser 0.99; Potassium 4.6; Sodium 138    Lipid Panel    Component Value Date/Time   CHOL 132 02/01/2022 1432   TRIG 133.0 02/01/2022 1432   HDL 55.20 02/01/2022 1432  CHOLHDL 2 02/01/2022 1432   VLDL 26.6 02/01/2022 1432   LDLCALC 50 02/01/2022 1432      Wt Readings from Last 3 Encounters:  01/10/23 234 lb (106.1 kg)  10/10/22 232 lb 6.4 oz (105.4 kg)  08/08/22 234 lb (106.1 kg)          03/27/2022    1:56 PM  PAD Screen  Previous PAD dx? No  Previous surgical procedure? Yes  Pain with walking? No  Feet/toe relief with dangling? No  Painful, non-healing ulcers? No  Extremities discolored? No      ASSESSMENT AND PLAN:  1.  Chronic diastolic heart failure: She has been taking furosemide 40 mg twice daily but does not appear to be significantly volume overloaded.  I decreased the dose today to 40 mg once daily.  2.  Essential hypertension: Blood pressure has been more controlled since she was switched from Toprol to carvedilol.  She is also on ramipril 5 mg once daily.  3.  Small ASD: I do not think this is significant enough to require closure.  Most recent echocardiogram in March was personally reviewed by me.  It showed normal size right ventricle with normal systolic function.  No obvious ASD was noted on that study.  4.  Chronic atypical chest pain: I do not think her symptoms are consistent with angina.  Cardiac catheterization in  August of last year showed normal coronary arteries.   Disposition: Follow-up in 6 months.  Signed,  [name], MD  01/10/2023 1:59 PM    [facility]

## 2023-02-05 ENCOUNTER — Ambulatory Visit (INDEPENDENT_AMBULATORY_CARE_PROVIDER_SITE_OTHER): Payer: Medicare HMO | Admitting: Primary Care

## 2023-02-05 ENCOUNTER — Encounter: Payer: Self-pay | Admitting: Primary Care

## 2023-02-05 VITALS — BP 118/62 | HR 70 | Temp 97.4°F | Ht 63.0 in | Wt 238.0 lb

## 2023-02-05 DIAGNOSIS — Z8639 Personal history of other endocrine, nutritional and metabolic disease: Secondary | ICD-10-CM

## 2023-02-05 DIAGNOSIS — I10 Essential (primary) hypertension: Secondary | ICD-10-CM

## 2023-02-05 DIAGNOSIS — M8580 Other specified disorders of bone density and structure, unspecified site: Secondary | ICD-10-CM

## 2023-02-05 DIAGNOSIS — Z0000 Encounter for general adult medical examination without abnormal findings: Secondary | ICD-10-CM

## 2023-02-05 DIAGNOSIS — Z23 Encounter for immunization: Secondary | ICD-10-CM

## 2023-02-05 DIAGNOSIS — K219 Gastro-esophageal reflux disease without esophagitis: Secondary | ICD-10-CM | POA: Diagnosis not present

## 2023-02-05 DIAGNOSIS — I5032 Chronic diastolic (congestive) heart failure: Secondary | ICD-10-CM | POA: Diagnosis not present

## 2023-02-05 DIAGNOSIS — F32A Depression, unspecified: Secondary | ICD-10-CM

## 2023-02-05 DIAGNOSIS — R600 Localized edema: Secondary | ICD-10-CM

## 2023-02-05 LAB — COMPREHENSIVE METABOLIC PANEL
ALT: 13 U/L (ref 0–35)
AST: 15 U/L (ref 0–37)
Albumin: 4.2 g/dL (ref 3.5–5.2)
Alkaline Phosphatase: 66 U/L (ref 39–117)
BUN: 18 mg/dL (ref 6–23)
CO2: 28 mEq/L (ref 19–32)
Calcium: 9.6 mg/dL (ref 8.4–10.5)
Chloride: 104 mEq/L (ref 96–112)
Creatinine, Ser: 0.81 mg/dL (ref 0.40–1.20)
GFR: 69.4 mL/min (ref >60.00)
Glucose, Bld: 108 mg/dL — ABNORMAL HIGH (ref 70–99)
Potassium: 4.4 mEq/L (ref 3.5–5.1)
Sodium: 138 mEq/L (ref 135–145)
Total Bilirubin: 0.6 mg/dL (ref 0.2–1.2)
Total Protein: 6.7 g/dL (ref 6.0–8.3)

## 2023-02-05 LAB — HEMOGLOBIN A1C: Hgb A1c MFr Bld: 5.5 % (ref 4.6–6.5)

## 2023-02-05 LAB — LIPID PANEL
Cholesterol: 117 mg/dL (ref 0–200)
HDL: 48.9 mg/dL (ref >39.00)
LDL Cholesterol: 45 mg/dL (ref 0–99)
NonHDL: 68.48
Total CHOL/HDL Ratio: 2
Triglycerides: 117 mg/dL (ref 0.0–149.0)
VLDL: 23.4 mg/dL (ref 0.0–40.0)

## 2023-02-05 NOTE — Assessment & Plan Note (Signed)
Diet controlled.  Repeat A1C pending.

## 2023-02-05 NOTE — Assessment & Plan Note (Signed)
Bone density scan UTD.  Continue calcium and vitamin D.  

## 2023-02-05 NOTE — Assessment & Plan Note (Signed)
Immunizations UTD. Add Prevnar 20 today. Bone density scan UTD Mammogram declined by patient Colonoscopy declined given age.  Discussed the importance of a healthy diet and regular exercise in order for weight loss, and to reduce the risk of further co-morbidity.  Exam stable. Labs pending.  Follow up in 1 year for repeat physical.

## 2023-02-05 NOTE — Assessment & Plan Note (Signed)
Controlled.  No concerns today. Continue to monitor.  

## 2023-02-05 NOTE — Assessment & Plan Note (Addendum)
Following with cardiology, office notes reviewed from May 2024.  Continue furosemide 40 mg daily, carvedilol 12.5 mg twice daily, spironolactone 25 mg daily.  She does not weigh herself so we discussed to start daily weights and to report weight gain of >2 lbs in 24 hours or >5 pounds in 1 week. Overall appears stable today, although her weight since February has increased by 6 pounds. Discussed this today.

## 2023-02-05 NOTE — Assessment & Plan Note (Signed)
Overall appears stable.  Continue furosemide 40 mg daily for now. I did encourage she start weighing herself daily and to report weight gain of >2 lbs in 24 hours or >5 lbs in 1 week.

## 2023-02-05 NOTE — Assessment & Plan Note (Signed)
Controlled.  Continue Nexium 20 mg every other day.  

## 2023-02-05 NOTE — Progress Notes (Signed)
Subjective:    Patient ID: [name], female    DOB: 1946/01/30, 77 y.o.   MRN: [id number]  HPI  [name] is a very pleasant 77 y.o. female who presents today for complete physical and follow up of chronic conditions.  Immunizations: -Shingles: Completed Zostavax -Pneumonia: Completed Prevnar 13 in 2016, Pneumovax 23 in 2013.  Diet: Fair diet.  Exercise: No regular exercise.  Eye exam: Completes annually  Dental exam: Completes semi-annually    Mammogram: Completed in 2016, she declines imaging given age. Bone Density Scan: September 2023  Colonoscopy: Declines due to age.  BP Readings from Last 3 Encounters:  02/05/23 118/62  01/10/23 110/64  10/10/22 118/64   Wt Readings from Last 3 Encounters:  02/05/23 238 lb (108 kg)  01/10/23 234 lb (106.1 kg)  10/10/22 232 lb 6.4 oz (105.4 kg)    She does not weigh herself at home. She isn't sure if she's noticed increased lower extremity swelling since her furosemide to 40 mg daily.    Review of Systems  Constitutional:  Negative for unexpected weight change.  HENT:  Negative for rhinorrhea.   Respiratory:  Negative for shortness of breath.   Cardiovascular:  Positive for leg swelling. Negative for chest pain.  Gastrointestinal:  Negative for constipation and diarrhea.  Genitourinary:  Negative for difficulty urinating.  Musculoskeletal:  Positive for arthralgias.  Skin:  Negative for rash.  Allergic/Immunologic: Negative for environmental allergies.  Neurological:  Negative for dizziness and headaches.  Psychiatric/Behavioral:  The patient is not nervous/anxious.          Past Medical History:  Diagnosis Date   Acute meniscal tear of left knee 01/19/2013   Atrial septal defect    a. 03/2022 RHC: PA sat 84%; b. 05/2022 Echo: EF 60-65%, suspected ostium secundum ASD w/ predominantly L->R shunting and + bubble study.   Blood transfusion without reported diagnosis    Chest pain    a. 2006 Cath: Nl cors;  b. 03/2022 Cath: Nl cors.   Chronic heart failure with preserved ejection fraction (HFpEF) (HCC)    a. 02/2019 Echo: EF 60-65%, no rwma, mildly enlarged RV, nl RV fxn, mildly dil LA; b. 03/2022 RHC:  RA 11, RV 45/5, PA 47/18 (35), PCWP 22, and LVEDP 18.   Depression    Diabetes mellitus    borderline   DJD (degenerative joint disease)    GERD (gastroesophageal reflux disease)    Hyperglycemia    Hypertension    Kidney stones 07/08/2017   Peripheral neuropathy    Restless leg    Syncope and collapse    Vaginal bleeding 02/03/2019    Social History   Socioeconomic History   Marital status: Widowed    Spouse name: Not on file   Number of children: 2   Years of education: Not on file   Highest education level: Not on file  Occupational History   Occupation: retired  Tobacco Use   Smoking status: Former    Packs/day: 1.00    Years: 20.00    Additional pack years: 0.00    Total pack years: 20.00    Types: Cigarettes   Smokeless tobacco: Never  Vaping Use   Vaping Use: Never used  Substance and Sexual Activity   Alcohol use: Yes    Comment: ocassionally 1 glass of wine per mth   Drug use: No   Sexual activity: Never  Other Topics Concern   Not on file  Social History Narrative  Patient would desire CPR.   Would not want feeding tubes or heroic measures.   Does not have HPOA.   Social Determinants of Health   Financial Resource Strain: Low Risk  (01/01/2022)   Overall Financial Resource Strain (CARDIA)    Difficulty of Paying Living Expenses: Not hard at all  Food Insecurity: No Food Insecurity (01/01/2022)   Hunger Vital Sign    Worried About Running Out of Food in the Last Year: Never true    Ran Out of Food in the Last Year: Never true  Transportation Needs: No Transportation Needs (01/01/2022)   PRAPARE - Administrator, Civil Service (Medical): No    Lack of Transportation (Non-Medical): No  Physical Activity: Inactive (01/01/2022)   Exercise Vital Sign     Days of Exercise per Week: 0 days    Minutes of Exercise per Session: 0 min  Stress: No Stress Concern Present (01/01/2022)   [employer] of Occupational Health - Occupational Stress Questionnaire    Feeling of Stress : Not at all  Social Connections: Moderately Integrated (01/01/2022)   Social Connection and Isolation Panel [NHANES]    Frequency of Communication with Friends and Family: More than three times a week    Frequency of Social Gatherings with Friends and Family: More than three times a week    Attends Religious Services: 1 to 4 times per year    Active Member of [employer] or Organizations: Yes    Attends Banker Meetings: 1 to 4 times per year    Marital Status: Widowed  Intimate Partner Violence: Not At Risk (01/01/2022)   Humiliation, Afraid, Rape, and Kick questionnaire    Fear of Current or Ex-Partner: No    Emotionally Abused: No    Physically Abused: No    Sexually Abused: No    Past Surgical History:  Procedure Laterality Date   APPENDECTOMY  08/27/1974   CARDIAC CATHETERIZATION  08/27/2004   @ [place]   CATARACT EXTRACTION     02/10/2022 (right eye) and 03/02/2022 (left eye)   JOINT REPLACEMENT  06/27/2005   right total knee   KNEE ARTHROSCOPY WITH MEDIAL MENISECTOMY Left 02/11/2013   Procedure: LEFT KNEE ARTHROSCOPY WITH MEDIAL MENISECTOMY;  Surgeon: Jacki Cones, MD;  Location: [facility];  Service: Orthopedics;  Laterality: Left;   MULTIPLE TOOTH EXTRACTIONS     OMENTECTOMY  07/21/2012   Procedure: OMENTECTOMY;  Surgeon: Adolph Pollack, MD;  Location: [facility];  Service: General;  Laterality: N/A;   right knee replacement  08/27/2006   RIGHT/LEFT HEART CATH AND CORONARY ANGIOGRAPHY Bilateral 04/04/2022   Procedure: RIGHT/LEFT HEART CATH AND CORONARY ANGIOGRAPHY;  Surgeon: Iran Ouch, MD;  Location: [facility];  Service: Cardiovascular;  Laterality: Bilateral;   TONSILLECTOMY  childhood   TUBAL LIGATION  08/27/1974   UMBILICAL  HERNIA REPAIR  07/21/2012   Procedure: HERNIA REPAIR UMBILICAL ADULT;  Surgeon: Adolph Pollack, MD;  Location: [facility];  Service: General;  Laterality: N/A;    Family History  Problem Relation Age of Onset   Parkinsonism Mother    Heart disease Father    Hypertension Father    Heart attack Father    AAA (abdominal aortic aneurysm) Father     No Known Allergies  Current Outpatient Medications on File Prior to Visit  Medication Sig Dispense Refill   aspirin 81 MG tablet Take 81 mg by mouth daily.     Calcium Carb-Cholecalciferol (CALCIUM 600 + D  PO) Take by mouth. 2 daily     carvedilol (COREG) 12.5 MG tablet TAKE 1 TABLET BY MOUTH 2 TIMES DAILY. **STOP METOPROLOL** 180 tablet 1   esomeprazole (NEXIUM) 20 MG capsule Take 20 mg by mouth every other day.     furosemide (LASIX) 40 MG tablet Take 1 tablet (40 mg total) by mouth daily. 90 tablet 1   Multiple Vitamins-Minerals (PRESERVISION AREDS 2 PO) Take by mouth daily in the afternoon.     ramipril (ALTACE) 5 MG capsule TAKE 1 CAPSULE (5 MG TOTAL) BY MOUTH DAILY FOR BLOOD PRESSURE. 90 capsule 0   spironolactone (ALDACTONE) 25 MG tablet Take 1 tablet (25 mg total) by mouth daily. 90 tablet 3   vitamin B-12 (CYANOCOBALAMIN) 1000 MCG tablet Take 1,000 mcg by mouth daily.     No current facility-administered medications on file prior to visit.    BP 118/62   Pulse 70   Temp (!) 97.4 F (36.3 C) (Temporal)   Ht 5\' 3"  (1.6 m)   Wt 238 lb (108 kg)   SpO2 97%   BMI 42.16 kg/m  Objective:   Physical Exam HENT:     Right Ear: Tympanic membrane and ear canal normal.     Left Ear: Tympanic membrane and ear canal normal.     Nose: Nose normal.  Eyes:     Conjunctiva/sclera: Conjunctivae normal.     Pupils: Pupils are equal, round, and reactive to light.  Neck:     Thyroid: No thyromegaly.  Cardiovascular:     Rate and Rhythm: Normal rate and regular rhythm.     Heart sounds: No murmur heard.    Comments: Moderate bilateral lower  extremity edema distal from knee to toes Pulmonary:     Effort: Pulmonary effort is normal.     Breath sounds: Normal breath sounds. No rales.  Abdominal:     General: Bowel sounds are normal.     Palpations: Abdomen is soft.     Tenderness: There is no abdominal tenderness.  Musculoskeletal:        General: Normal range of motion.     Cervical back: Neck supple.  Lymphadenopathy:     Cervical: No cervical adenopathy.  Skin:    General: Skin is warm and dry.     Findings: No rash.  Neurological:     Mental Status: She is alert and oriented to person, place, and time.     Cranial Nerves: No cranial nerve deficit.     Deep Tendon Reflexes: Reflexes are normal and symmetric.  Psychiatric:        Mood and Affect: Mood normal.           Assessment & Plan:  Preventative health care Assessment & Plan: Immunizations UTD. Add Prevnar 20 today. Bone density scan UTD Mammogram declined by patient Colonoscopy declined given age.  Discussed the importance of a healthy diet and regular exercise in order for weight loss, and to reduce the risk of further co-morbidity.  Exam stable. Labs pending.  Follow up in 1 year for repeat physical.    Chronic diastolic heart failure [facility]) Assessment & Plan: Following with cardiology, office notes reviewed from May 2024.  Continue furosemide 40 mg daily, carvedilol 12.5 mg twice daily, spironolactone 25 mg daily.  She does not weigh herself so we discussed to start daily weights and to report weight gain of >2 lbs in 24 hours or >5 pounds in 1 week. Overall appears stable today, although her weight since  February has increased by 6 pounds. Discussed this today.   Primary hypertension Assessment & Plan: Controlled.  Continue ramipril 5 mg daily, carvedilol 12.5 mg twice daily, spironolactone 25 mg daily. CMP pending.  Orders: -     Lipid panel -     Comprehensive metabolic panel  Gastroesophageal reflux disease, unspecified  whether esophagitis present Assessment & Plan: Controlled.  Continue Nexium 20 mg every other day.   Lower extremity edema Assessment & Plan: Overall appears stable.  Continue furosemide 40 mg daily for now. I did encourage she start weighing herself daily and to report weight gain of >2 lbs in 24 hours or >5 lbs in 1 week.    History of diabetes mellitus, type II Assessment & Plan: Diet controlled.  Repeat A1C pending.  Orders: -     Hemoglobin A1c  Depression, unspecified depression type Assessment & Plan: Controlled.  No concerns today. Continue to monitor.   Osteopenia, unspecified location Assessment & Plan: Bone density scan UTD. Continue calcium and vitamin D.         Doreene Nest, NP

## 2023-02-05 NOTE — Assessment & Plan Note (Signed)
Controlled.  Continue ramipril 5 mg daily, carvedilol 12.5 mg twice daily, spironolactone 25 mg daily. CMP pending.

## 2023-02-05 NOTE — Patient Instructions (Signed)
Stop by the lab prior to leaving today. I will notify you of your results once received.   Start weighing yourself daily. Notify your cardiologist or myself if your weight increases greater than 2 pounds in 24 hours or greater than 5 pounds in 1 week.  It was a pleasure to see you today!

## 2023-02-06 ENCOUNTER — Encounter: Payer: Self-pay | Admitting: Primary Care

## 2023-03-08 ENCOUNTER — Other Ambulatory Visit: Payer: Self-pay | Admitting: Primary Care

## 2023-03-08 DIAGNOSIS — I10 Essential (primary) hypertension: Secondary | ICD-10-CM

## 2023-03-16 ENCOUNTER — Other Ambulatory Visit: Payer: Self-pay | Admitting: Cardiovascular Disease

## 2023-03-17 ENCOUNTER — Other Ambulatory Visit: Payer: Self-pay | Admitting: Nurse Practitioner

## 2023-03-17 DIAGNOSIS — I5033 Acute on chronic diastolic (congestive) heart failure: Secondary | ICD-10-CM

## 2023-04-23 ENCOUNTER — Other Ambulatory Visit: Payer: Self-pay | Admitting: Nurse Practitioner

## 2023-06-14 ENCOUNTER — Other Ambulatory Visit: Payer: Self-pay | Admitting: Cardiovascular Disease

## 2023-07-23 ENCOUNTER — Ambulatory Visit: Payer: Medicare HMO | Attending: Cardiovascular Disease | Admitting: Cardiovascular Disease

## 2023-07-23 ENCOUNTER — Encounter: Payer: Self-pay | Admitting: Cardiovascular Disease

## 2023-07-23 VITALS — BP 125/77 | HR 66 | Ht 62.0 in | Wt 236.2 lb

## 2023-07-23 DIAGNOSIS — I5032 Chronic diastolic (congestive) heart failure: Secondary | ICD-10-CM

## 2023-07-23 DIAGNOSIS — I10 Essential (primary) hypertension: Secondary | ICD-10-CM | POA: Diagnosis not present

## 2023-07-23 DIAGNOSIS — Q211 Atrial septal defect, unspecified: Secondary | ICD-10-CM | POA: Diagnosis not present

## 2023-07-23 DIAGNOSIS — N939 Abnormal uterine and vaginal bleeding, unspecified: Secondary | ICD-10-CM | POA: Diagnosis not present

## 2023-07-23 MED ORDER — SPIRONOLACTONE 25 MG PO TABS: 25.0000 mg | ORAL_TABLET | Freq: Every day | ORAL | 2 refills | Status: DC

## 2023-07-23 MED ORDER — FUROSEMIDE 40 MG PO TABS: 40.0000 mg | ORAL_TABLET | Freq: Every day | ORAL | 3 refills | Status: DC

## 2023-07-23 NOTE — Progress Notes (Signed)
Cardiology Office Note   Date:  07/23/2023   ID:  [name] Sep 02, 1945, MRN [id number]  PCP:  Doreene Nest, NP  Cardiologist:   [name], MD   Chief Complaint  Patient presents with   Follow-up    6 month f/u c/o fatigue/no energy, ABN Vaginal bleeding, edema legs and sob. Meds reviewed verbally with pt.      History of Present Illness: [name] is a 77 y.o. female who is here today for follow-up visit regarding chronic diastolic heart failure.    She has chronic medical conditions that include essential hypertension, obesity, previous tobacco use, borderline diabetes mellitus, GERD, peripheral neuropathy and restless legs.  She was seen in August 2023 for worsening exertional dyspnea and chest heaviness.  Cardiac catheterization was performed which showed normal coronary arteries with an ejection fraction of 55 to 60%.  Right heart catheterization showed mildly elevated filling pressures with elevated PA saturation at 84%.  Follow-up limited echo with bubble study showed normal LV systolic function with likely small ASD. She continues to complain of intermittent episodes of chest pain described as burning sensation with some sharp discomfort.  She has chronic exertional dyspnea and she complains of increased fatigue. She does report vaginal bleeding over the last 1 week.  Past Medical History:  Diagnosis Date   Acute meniscal tear of left knee 01/19/2013   Atrial septal defect    a. 03/2022 RHC: PA sat 84%; b. 05/2022 Echo: EF 60-65%, suspected ostium secundum ASD w/ predominantly L->R shunting and + bubble study.   Blood transfusion without reported diagnosis    Chest pain    a. 2006 Cath: Nl cors; b. 03/2022 Cath: Nl cors.   Chronic heart failure with preserved ejection fraction (HFpEF) (HCC)    a. 02/2019 Echo: EF 60-65%, no rwma, mildly enlarged RV, nl RV fxn, mildly dil LA; b. 03/2022 RHC:  RA 11, RV 45/5, PA 47/18 (35), PCWP 22, and LVEDP 18.    Depression    Diabetes mellitus    borderline   DJD (degenerative joint disease)    GERD (gastroesophageal reflux disease)    Hyperglycemia    Hypertension    Kidney stones 07/08/2017   Macular degeneration of left eye    Peripheral neuropathy    Restless leg    Syncope and collapse    Vaginal bleeding 02/03/2019    Past Surgical History:  Procedure Laterality Date   APPENDECTOMY  08/27/1974   CARDIAC CATHETERIZATION  08/27/2004   @ [place]   CATARACT EXTRACTION     02/10/2022 (right eye) and 03/02/2022 (left eye)   JOINT REPLACEMENT  06/27/2005   right total knee   KNEE ARTHROSCOPY WITH MEDIAL MENISECTOMY Left 02/11/2013   Procedure: LEFT KNEE ARTHROSCOPY WITH MEDIAL MENISECTOMY;  Surgeon: Jacki Cones, MD;  Location: [facility];  Service: Orthopedics;  Laterality: Left;   MULTIPLE TOOTH EXTRACTIONS     OMENTECTOMY  07/21/2012   Procedure: OMENTECTOMY;  Surgeon: Adolph Pollack, MD;  Location: [facility];  Service: General;  Laterality: N/A;   right knee replacement  08/27/2006   RIGHT/LEFT HEART CATH AND CORONARY ANGIOGRAPHY Bilateral 04/04/2022   Procedure: RIGHT/LEFT HEART CATH AND CORONARY ANGIOGRAPHY;  Surgeon: Iran Ouch, MD;  Location: [facility];  Service: Cardiovascular;  Laterality: Bilateral;   TONSILLECTOMY  childhood   TUBAL LIGATION  08/27/1974   UMBILICAL HERNIA REPAIR  07/21/2012   Procedure: HERNIA REPAIR UMBILICAL ADULT;  Surgeon:  Adolph Pollack, MD;  Location: [facility];  Service: General;  Laterality: N/A;     Current Outpatient Medications  Medication Sig Dispense Refill   aspirin 81 MG tablet Take 81 mg by mouth daily.     Calcium Carb-Cholecalciferol (CALCIUM 600 + D PO) Take by mouth. 2 daily     carvedilol (COREG) 12.5 MG tablet TAKE 1 TABLET BY MOUTH 2 TIMES DAILY. **STOP METOPROLOL** 180 tablet 0   esomeprazole (NEXIUM) 20 MG capsule Take 20 mg by mouth every other day.     furosemide (LASIX) 40 MG tablet Take 1 tablet (40 mg  total) by mouth 2 (two) times daily. 180 tablet 3   Multiple Vitamins-Minerals (PRESERVISION AREDS 2 PO) Take by mouth daily in the afternoon.     ramipril (ALTACE) 5 MG capsule TAKE 1 CAPSULE (5 MG TOTAL) BY MOUTH DAILY FOR BLOOD PRESSURE. 90 capsule 2   spironolactone (ALDACTONE) 25 MG tablet Take 1 tablet (25 mg total) by mouth daily. 90 tablet 0   vitamin B-12 (CYANOCOBALAMIN) 1000 MCG tablet Take 1,000 mcg by mouth daily.     No current facility-administered medications for this visit.    Allergies:   Patient has no known allergies.    Social History:  The patient  reports that she has quit smoking. Her smoking use included cigarettes. She has a 20 pack-year smoking history. She has never used smokeless tobacco. She reports current alcohol use. She reports that she does not use drugs.   Family History:  The patient's family history includes AAA (abdominal aortic aneurysm) in her father; Heart attack in her father; Heart disease in her father; Hypertension in her father; Parkinsonism in her mother.    ROS:  Please see the history of present illness.   Otherwise, review of systems are positive for none.   All other systems are reviewed and negative.    PHYSICAL EXAM: VS:  BP 125/77 (BP Location: Left Arm, Patient Position: Sitting, Cuff Size: Large)   Pulse 66   Ht 5\' 2"  (1.575 m)   Wt 236 lb 4 oz (107.2 kg)   SpO2 97%   BMI 43.21 kg/m  , BMI Body mass index is 43.21 kg/m. GEN: Well nourished, well developed, in no acute distress  HEENT: normal  Neck: no JVD, carotid bruits, or masses Cardiac: RRR; no murmurs, rubs, or gallops, mild bilateral lymphedema Respiratory:  clear to auscultation bilaterally, normal work of breathing GI: soft, nontender, nondistended, + BS MS: no deformity or atrophy  Skin: warm and dry, no rash Neuro:  Strength and sensation are intact Psych: euthymic mood, full affect   EKG:  EKG is ordered today. The ekg ordered today demonstrates: Normal  sinus rhythm Pulmonary disease pattern Left anterior fascicular block   Recent Labs: 02/05/2023: ALT 13; BUN 18; Creatinine, Ser 0.81; Potassium 4.4; Sodium 138    Lipid Panel    Component Value Date/Time   CHOL 117 02/05/2023 1249   TRIG 117.0 02/05/2023 1249   HDL 48.90 02/05/2023 1249   CHOLHDL 2 02/05/2023 1249   VLDL 23.4 02/05/2023 1249   LDLCALC 45 02/05/2023 1249      Wt Readings from Last 3 Encounters:  07/23/23 236 lb 4 oz (107.2 kg)  02/05/23 238 lb (108 kg)  01/10/23 234 lb (106.1 kg)          03/27/2022    1:56 PM  PAD Screen  Previous PAD dx? No  Previous surgical procedure? Yes  Pain with walking? No  Feet/toe relief with dangling? No  Painful, non-healing ulcers? No  Extremities discolored? No      ASSESSMENT AND PLAN:  1.  Chronic diastolic heart failure: She appears to be euvolemic on furosemide 40 mg once daily.  She is also on spironolactone 25 mg once daily.  She takes an extra dose of furosemide 20 mg as needed.  2.  Essential hypertension: Blood pressure is well-controlled on current medications.  3.  Small ASD: Recommend a follow-up echocardiogram in March which was requested today.  4.  Chronic atypical chest pain: I do not think her symptoms are consistent with angina.  Cardiac catheterization in August of 2023 showed normal coronary arteries.  5.  Vaginal bleeding: Check CBC.  I asked her to follow-up with her primary care physician regarding this.   Disposition: Follow-up in 6 months.  Signed,  [name], MD  07/23/2023 1:39 PM    [facility]

## 2023-07-23 NOTE — Patient Instructions (Signed)
Medication Instructions:  No changes *If you need a refill on your cardiac medications before your next appointment, please call your pharmacy*   Lab Work: Your provider would like for you to have the following labs today: CBC and TSH  If you have labs (blood work) drawn today and your tests are completely normal, you will receive your results only by: MyChart Message (if you have MyChart) OR A paper copy in the mail If you have any lab test that is abnormal or we need to change your treatment, we will call you to review the results.   Testing/Procedures: Your physician has requested that you have an echocardiogram in March. Echocardiography is a painless test that uses sound waves to create images of your heart. It provides your doctor with information about the size and shape of your heart and how well your heart's chambers and valves are working.   You may receive an ultrasound enhancing agent through an IV if needed to better visualize your heart during the echo. This procedure takes approximately one hour.  There are no restrictions for this procedure.  This will take place at [address] [facility] [address], [place] [address]  Please note: We ask at that you not bring children with you during ultrasound (echo/ vascular) testing. Due to room size and safety concerns, children are not allowed in the ultrasound rooms during exams. Our front office staff cannot provide observation of children in our lobby area while testing is being conducted. An adult accompanying a patient to their appointment will only be allowed in the ultrasound room at the discretion of the ultrasound technician under special circumstances. We apologize for any inconvenience.    Follow-Up: At [facility], you and your health needs are our priority.  As part of our continuing mission to provide you with exceptional heart care, we have created designated Provider Care Teams.  These Care  Teams include your primary Cardiologist (physician) and Advanced Practice Providers (APPs -  Physician Assistants and Nurse Practitioners) who all work together to provide you with the care you need, when you need it.  We recommend signing up for the patient portal called "MyChart".  Sign up information is provided on this After Visit Summary.  MyChart is used to connect with patients for Virtual Visits (Telemedicine).  Patients are able to view lab/test results, encounter notes, upcoming appointments, etc.  Non-urgent messages can be sent to your provider as well.   To learn more about what you can do with MyChart, go to [email or web address].    Your next appointment:   6 month(s)  Provider:   You may see [name], MD or one of the following Advanced Practice Providers on your designated Care Team:   Nicolasa Ducking, NP Eula Listen, PA-C Cadence Fransico Michael, PA-C Charlsie Quest, NP Carlos Levering, NP

## 2023-07-24 LAB — TSH: TSH: 2.75 u[IU]/mL (ref 0.450–4.500)

## 2023-07-24 LAB — CBC
Hematocrit: 44.2 % (ref 34.0–46.6)
Hemoglobin: 14.4 g/dL (ref 11.1–15.9)
MCH: 29.4 pg (ref 26.6–33.0)
MCHC: 32.6 g/dL (ref 31.5–35.7)
MCV: 90 fL (ref 79–97)
Platelets: 267 10*3/uL (ref 150–450)
RBC: 4.89 x10E6/uL (ref 3.77–5.28)
RDW: 13 % (ref 11.7–15.4)
WBC: 9 10*3/uL (ref 3.4–10.8)

## 2023-07-26 ENCOUNTER — Other Ambulatory Visit: Payer: Self-pay | Admitting: Nurse Practitioner

## 2023-08-19 ENCOUNTER — Other Ambulatory Visit: Payer: Self-pay | Admitting: Cardiovascular Disease

## 2023-08-27 ENCOUNTER — Other Ambulatory Visit: Payer: Self-pay | Admitting: Primary Care

## 2023-08-27 DIAGNOSIS — I10 Essential (primary) hypertension: Secondary | ICD-10-CM

## 2023-10-26 ENCOUNTER — Other Ambulatory Visit: Payer: Self-pay | Admitting: Primary Care

## 2023-10-26 DIAGNOSIS — I10 Essential (primary) hypertension: Secondary | ICD-10-CM

## 2023-10-29 ENCOUNTER — Ambulatory Visit: Payer: Medicare HMO | Attending: Cardiovascular Disease

## 2023-10-29 ENCOUNTER — Other Ambulatory Visit: Payer: Self-pay | Admitting: Primary Care

## 2023-10-29 DIAGNOSIS — Q211 Atrial septal defect, unspecified: Secondary | ICD-10-CM | POA: Diagnosis not present

## 2023-10-29 DIAGNOSIS — I10 Essential (primary) hypertension: Secondary | ICD-10-CM

## 2023-10-30 LAB — ECHOCARDIOGRAM COMPLETE
AV Mean grad: 5 mmHg
AV Peak grad: 8.5 mmHg
Ao pk vel: 1.46 m/s
Area-P 1/2: 4.15 cm2
S' Lateral: 2.6 cm

## 2023-11-01 ENCOUNTER — Telehealth: Payer: Self-pay

## 2023-11-01 DIAGNOSIS — I10 Essential (primary) hypertension: Secondary | ICD-10-CM

## 2023-11-01 MED ORDER — RAMIPRIL 5 MG PO CAPS: ORAL_CAPSULE | ORAL | 0 refills | Status: DC

## 2023-11-01 NOTE — Telephone Encounter (Signed)
 Patients refill request for ramipril was denied on 10/29/23. Do not see any notes where patient needs appt. Last fill was 03/08/23 #90 w/ 2 refills. Last CPE June 2024.  Please advise.

## 2023-11-01 NOTE — Telephone Encounter (Signed)
 Copied from CRM [id number]. Topic: Clinical - Prescription Issue >> Oct 31, 2023  2:20 PM Elizebeth Brooking wrote: Reason for CRM: Patient called in stated that the pharmacy has still not received her prescription  ramipril (ALTACE) 5 MG capsule [Pharmacy Med Name: RAMIPRIL 5 MG CAPSULE] , she was calling in to see what is the status on this

## 2023-11-01 NOTE — Telephone Encounter (Signed)
 Patient will need appt in order to have proper documentation for referral. Please schedule.

## 2023-11-01 NOTE — Telephone Encounter (Signed)
 Called patient and reviewed all information. Patient verbalized understanding. Will call if any further questions.

## 2023-11-01 NOTE — Addendum Note (Signed)
 Addended by: Eden Emms on: 11/01/2023 08:07 AM   Modules accepted: Orders

## 2023-11-01 NOTE — Telephone Encounter (Signed)
 Copied from CRM [id number]. Topic: General - Other >> Oct 31, 2023  2:21 PM Jon Gills C wrote: Reason for CRM: Patient called in stating that she is having a hard time with her feet, is wanting Jae Dire to send her a referral to be seen by a foot doctor

## 2023-11-01 NOTE — Telephone Encounter (Signed)
 I see that it was declined twice. I will give her 30 days and then she will need to get further medications from [facility]

## 2023-11-01 NOTE — Telephone Encounter (Signed)
 Spoke to pt, scheduled pt with Dr. Patsy Lager for [place], 11/04/23

## 2023-11-03 NOTE — Progress Notes (Unsigned)
   Fahad Cisse T. Zamaya Rapaport, MD, CAQ Sports Medicine [facility] [address] [place], [address]  Phone: [phone]  FAX: [phone]  CHELSE MATAS - 78 y.o. female  MRN [id number]  Date of Birth: [birth year]  Date: 11/04/2023  PCP: Doreene Nest, NP  Referral: Doreene Nest, NP  No chief complaint on file.  Subjective:   MARGARETT VITI is a 78 y.o. very pleasant female patient with There is no height or weight on file to calculate BMI. who presents with the following:  She is a pleasant 78 year old patient, she presents with some ongoing left-sided foot pain.  Patient presents with some ongoing left-sided foot pain, and she is a long-term patient of Dr. Chestine Spore.    Review of Systems is noted in the HPI, as appropriate  Objective:   There were no vitals taken for this visit.  GEN: No acute distress; alert,appropriate. PULM: Breathing comfortably in no respiratory distress PSYCH: Normally interactive.   Laboratory and Imaging Data:  Assessment and Plan:   ***

## 2023-11-04 ENCOUNTER — Ambulatory Visit (INDEPENDENT_AMBULATORY_CARE_PROVIDER_SITE_OTHER): Admitting: Family Medicine

## 2023-11-04 ENCOUNTER — Ambulatory Visit (INDEPENDENT_AMBULATORY_CARE_PROVIDER_SITE_OTHER)
Admission: RE | Admit: 2023-11-04 | Discharge: 2023-11-04 | Disposition: A | Discharge status: Home / Self Care | Source: Ambulatory Visit | Attending: Family Medicine | Admitting: Family Medicine

## 2023-11-04 VITALS — BP 104/68 | HR 72 | Temp 98.4°F | Ht 63.0 in | Wt 234.0 lb

## 2023-11-04 DIAGNOSIS — M722 Plantar fascial fibromatosis: Secondary | ICD-10-CM

## 2023-11-04 DIAGNOSIS — M79671 Pain in right foot: Secondary | ICD-10-CM

## 2023-11-04 MED ORDER — PREDNISONE 20 MG PO TABS: ORAL_TABLET | ORAL | 0 refills | Status: DC

## 2023-11-04 NOTE — Telephone Encounter (Addendum)
 Please call patient:  Let her know that from our records it looked like she had another 1 month or more on hand at home of her ramipril. Was this not the case? Did she run out?

## 2023-11-04 NOTE — Telephone Encounter (Signed)
Unable to reach patient. Unable to leave voicemail.  

## 2023-11-04 NOTE — Patient Instructions (Signed)
 [employer] make some nice cushioned slip-on shoes [employer] tennis shoes  Probably the best place to go would be the shoe market in [place] on [address].  [place]?

## 2023-11-05 ENCOUNTER — Encounter: Payer: Self-pay | Admitting: Family Medicine

## 2023-11-06 NOTE — Telephone Encounter (Signed)
 Called and spoke with patient, she is unsure how she was already low, patient states she has less than a week of pills left on her previous prescription. She will pick up the 30 day supply sent in.

## 2023-11-07 NOTE — Telephone Encounter (Signed)
 Noted.

## 2023-11-23 ENCOUNTER — Other Ambulatory Visit: Payer: Self-pay | Admitting: Nurse Practitioner

## 2023-11-23 DIAGNOSIS — I10 Essential (primary) hypertension: Secondary | ICD-10-CM

## 2024-01-07 ENCOUNTER — Ambulatory Visit (INDEPENDENT_AMBULATORY_CARE_PROVIDER_SITE_OTHER): Payer: Medicare HMO

## 2024-01-07 VITALS — Ht 63.0 in | Wt 234.0 lb

## 2024-01-07 DIAGNOSIS — Z0000 Encounter for general adult medical examination without abnormal findings: Secondary | ICD-10-CM

## 2024-01-07 NOTE — Patient Instructions (Signed)
 Ms. [name] , Thank you for taking time out of your busy schedule to complete your Annual Wellness Visit with me. I enjoyed our conversation and look forward to speaking with you again next year. I, as well as your care team,  appreciate your ongoing commitment to your health goals. Please review the following plan we discussed and let me know if I can assist you in the future. Your Game plan/ To Do List     Follow up Visits: Next Medicare AWV with our clinical staff: no: pt says she does not want to do AWV. I asked her to talk w/her pcp about it.   Have you seen your provider in the last 6 months (3 months if uncontrolled diabetes)? Yes Next Office Visit with your provider: 02/07/24  Clinician Recommendations:  pt to try to drink 6-8 glasses of water, and 5 servings of fruits and vegetables each day.       This is a list of the screening recommended for you and due dates:  Health Maintenance  Topic Date Due   Zoster (Shingles) Vaccine (1 of 2) 06/01/1994   Complete foot exam   08/04/2020   Yearly kidney health urinalysis for diabetes  02/14/2022   Eye exam for diabetics  10/02/2022   COVID-19 Vaccine (4 - 2024-25 season) 04/28/2023   Hemoglobin A1C  08/07/2023   Yearly kidney function blood test for diabetes  02/05/2024   Mammogram  02/05/2024*   Flu Shot  03/27/2024   Medicare Annual Wellness Visit  01/06/2025   Pneumonia Vaccine  Completed   DEXA scan (bone density measurement)  Completed   Hepatitis C Screening  Completed   HPV Vaccine  Aged Out   Meningitis B Vaccine  Aged Out   DTaP/Tdap/Td vaccine  Discontinued   Cologuard (Stool DNA test)  Discontinued  *Topic was postponed. The date shown is not the original due date.    Advanced directives: (Declined) Advance directive discussed with you today. Even though you declined this today, please call our office should you change your mind, and we can give you the proper paperwork for you to fill out. Advance Care Planning is  important because it:  [x]  Makes sure you receive the medical care that is consistent with your values, goals, and preferences  [x]  It provides guidance to your family and loved ones and reduces their decisional burden about whether or not they are making the right decisions based on your wishes.  Follow the link provided in your after visit summary or read over the paperwork we have mailed to you to help you started getting your Advance Directives in place. If you need assistance in completing these, please reach out to us  so that we can help you!

## 2024-01-07 NOTE — Progress Notes (Signed)
 Subjective:   [name] is a 78 y.o. who presents for a Medicare Wellness preventive visit.  As a reminder, Annual Wellness Visits don't include a physical exam, and some assessments may be limited, especially if this visit is performed virtually. We may recommend an in-person visit if needed.  Visit Complete: Virtual I connected with  [name] on 01/07/24 by a audio enabled telemedicine application and verified that I am speaking with the correct person using two identifiers.  Patient Location: Home  Provider Location: Office/Clinic  I discussed the limitations of evaluation and management by telemedicine. The patient expressed understanding and agreed to proceed.  Vital Signs: Because this visit was a virtual/telehealth visit, some criteria may be missing or patient reported. Any vitals not documented were not able to be obtained and vitals that have been documented are patient reported.  VideoDeclined- This patient declined Librarian, academic. Therefore the visit was completed with audio only.  Persons Participating in Visit: Patient.  AWV Questionnaire: No: Patient Medicare AWV questionnaire was not completed prior to this visit.  Cardiac Risk Factors include: advanced age (>6men, >66 women);hypertension;obesity (BMI >30kg/m2);sedentary lifestyle     Objective:     Today's Vitals   01/07/24 1038  Weight: 234 lb (106.1 kg)  Height: 5\' 3"  (1.6 m)   Body mass index is 41.45 kg/m.     01/07/2024   10:47 AM 01/01/2022    1:15 PM 09/28/2020    2:49 PM 01/11/2020    4:58 PM 01/16/2018   12:22 PM 12/26/2015    1:15 PM 02/11/2013    9:23 AM  Advanced Directives  Does Patient Have a Medical Advance Directive? No No Yes No No No   Type of Surveyor, minerals;Living will      Copy of Healthcare Power of Attorney in Chart?   No - copy requested      Would patient like information on creating a medical advance  directive?  No - Patient declined   Yes (MAU/Ambulatory/Procedural Areas - Information given) Yes - Educational materials given   Pre-existing out of facility DNR order (yellow form or pink MOST form)       No    Current Medications (verified) Outpatient Encounter Medications as of 01/07/2024  Medication Sig   aspirin  81 MG tablet Take 81 mg by mouth daily.   Calcium Carb-Cholecalciferol (CALCIUM 600 + D PO) Take by mouth. 2 daily   carvedilol  (COREG ) 12.5 MG tablet TAKE 1 TABLET BY MOUTH 2 TIMES DAILY. **STOP METOPROLOL **   esomeprazole (NEXIUM) 20 MG capsule Take 20 mg by mouth every other day.   furosemide  (LASIX ) 40 MG tablet Take 1 tablet (40 mg total) by mouth daily. May take an extra 20 mg (half tablet) as needed for swelling   Multiple Vitamins-Minerals (PRESERVISION AREDS 2 PO) Take by mouth daily in the afternoon.   ramipril  (ALTACE ) 5 MG capsule TAKE 1 CAPSULE (5 MG TOTAL) BY MOUTH DAILY FOR BLOOD PRESSURE.   vitamin B-12 (CYANOCOBALAMIN ) 1000 MCG tablet Take 1,000 mcg by mouth daily.   predniSONE  (DELTASONE ) 20 MG tablet 2 tabs po daily for 5 days, then 1 tab po daily for 5 days (Patient not taking: Reported on 01/07/2024)   spironolactone  (ALDACTONE ) 25 MG tablet Take 1 tablet (25 mg total) by mouth daily.   No facility-administered encounter medications on file as of 01/07/2024.    Allergies (verified) Patient has no known allergies.   History:  Past Medical History:  Diagnosis Date   Acute meniscal tear of left knee 01/19/2013   Atrial septal defect    a. 03/2022 RHC: PA sat 84%; b. 05/2022 Echo: EF 60-65%, suspected ostium secundum ASD w/ predominantly L->R shunting and + bubble study.   Blood transfusion without reported diagnosis    Chest pain    a. 2006 Cath: Nl cors; b. 03/2022 Cath: Nl cors.   Chronic heart failure with preserved ejection fraction (HFpEF) (HCC)    a. 02/2019 Echo: EF 60-65%, no rwma, mildly enlarged RV, nl RV fxn, mildly dil LA; b. 03/2022 RHC:  RA 11,  RV 45/5, PA 47/18 (35), PCWP 22, and LVEDP 18.   Depression    Diabetes mellitus    borderline   DJD (degenerative joint disease)    GERD (gastroesophageal reflux disease)    Hyperglycemia    Hypertension    Kidney stones 07/08/2017   Macular degeneration of left eye    Peripheral neuropathy    Restless leg    Syncope and collapse    Vaginal bleeding 02/03/2019   Past Surgical History:  Procedure Laterality Date   APPENDECTOMY  08/27/1974   CARDIAC CATHETERIZATION  08/27/2004   @ [facility]   CATARACT EXTRACTION     02/10/2022 (right eye) and 03/02/2022 (left eye)   JOINT REPLACEMENT  06/27/2005   right total knee   KNEE ARTHROSCOPY WITH MEDIAL MENISECTOMY Left 02/11/2013   Procedure: LEFT KNEE ARTHROSCOPY WITH MEDIAL MENISECTOMY;  Surgeon: Florencia Hunter, MD;  Location: [facility];  Service: Orthopedics;  Laterality: Left;   MULTIPLE TOOTH EXTRACTIONS     OMENTECTOMY  07/21/2012   Procedure: OMENTECTOMY;  Surgeon: Harlee Lichtenstein, MD;  Location: [facility];  Service: General;  Laterality: N/A;   right knee replacement  08/27/2006   RIGHT/LEFT HEART CATH AND CORONARY ANGIOGRAPHY Bilateral 04/04/2022   Procedure: RIGHT/LEFT HEART CATH AND CORONARY ANGIOGRAPHY;  Surgeon: Wenona Hamilton, MD;  Location: [facility];  Service: Cardiovascular;  Laterality: Bilateral;   TONSILLECTOMY  childhood   TUBAL LIGATION  08/27/1974   UMBILICAL HERNIA REPAIR  07/21/2012   Procedure: HERNIA REPAIR UMBILICAL ADULT;  Surgeon: Harlee Lichtenstein, MD;  Location: [facility];  Service: General;  Laterality: N/A;   Family History  Problem Relation Age of Onset   Parkinsonism Mother    Heart disease Father    Hypertension Father    Heart attack Father    AAA (abdominal aortic aneurysm) Father    Social History   Socioeconomic History   Marital status: Widowed    Spouse name: Not on file   Number of children: 2   Years of education: Not on file   Highest education level: Bachelor's degree  (e.g., BA, AB, BS)  Occupational History   Occupation: retired  Tobacco Use   Smoking status: Former    Current packs/day: 1.00    Average packs/day: 1 pack/day for 20.0 years (20.0 ttl pk-yrs)    Types: Cigarettes   Smokeless tobacco: Never  Vaping Use   Vaping status: Never Used  Substance and Sexual Activity   Alcohol use: Yes    Comment: ocassionally 1 glass of wine per mth   Drug use: No   Sexual activity: Never  Other Topics Concern   Not on file  Social History Narrative   Patient would desire CPR.   Would not want feeding tubes or heroic measures.   Does not have HPOA.   Social Drivers of Health  Financial Resource Strain: Low Risk  (01/07/2024)   Overall Financial Resource Strain (CARDIA)    Difficulty of Paying Living Expenses: Not hard at all  Food Insecurity: No Food Insecurity (01/07/2024)   Hunger Vital Sign    Worried About Running Out of Food in the Last Year: Never true    Ran Out of Food in the Last Year: Never true  Transportation Needs: No Transportation Needs (01/07/2024)   PRAPARE - Administrator, Civil Service (Medical): No    Lack of Transportation (Non-Medical): No  Physical Activity: Inactive (01/07/2024)   Exercise Vital Sign    Days of Exercise per Week: 0 days    Minutes of Exercise per Session: 0 min  Stress: No Stress Concern Present (01/07/2024)   [employer] of Occupational Health - Occupational Stress Questionnaire    Feeling of Stress : Not at all  Recent Concern: Stress - Stress Concern Present (11/02/2023)   [employer] of Occupational Health - Occupational Stress Questionnaire    Feeling of Stress : Rather much  Social Connections: Unknown (01/07/2024)   Social Connection and Isolation Panel [NHANES]    Frequency of Communication with Friends and Family: More than three times a week    Frequency of Social Gatherings with Friends and Family: Once a week    Attends Religious Services: Patient declined    Automotive engineer or Organizations: No    Attends Banker Meetings: Never    Marital Status: Widowed    Tobacco Counseling Counseling given: Not Answered    Clinical Intake:  Pre-visit preparation completed: Yes  Pain : No/denies pain     BMI - recorded: 41.45 Nutritional Status: BMI > 30  Obese Nutritional Risks: None Diabetes: No  Lab Results  Component Value Date   HGBA1C 5.5 02/05/2023   HGBA1C 5.4 02/01/2022   HGBA1C 5.6 02/14/2021     How often do you need to have someone help you when you read instructions, pamphlets, or other written materials from your doctor or pharmacy?: 1 - Never  Interpreter Needed?: No  Comments: daughter lives pt Information entered by :: B.Keelyn Monjaras,LPN   Activities of Daily Living     01/07/2024   10:47 AM  In your present state of health, do you have any difficulty performing the following activities:  Hearing? 1  Vision? 1  Difficulty concentrating or making decisions? 0  Walking or climbing stairs? 1  Dressing or bathing? 0  Doing errands, shopping? 0  Preparing Food and eating ? N  Using the Toilet? N  In the past six months, have you accidently leaked urine? N  Do you have problems with loss of bowel control? N  Managing your Medications? N  Managing your Finances? N  Housekeeping or managing your Housekeeping? Y    Patient Care Team: Gabriel John, NP as PCP - General (Internal Medicine) Wenona Hamilton, MD as PCP - Cardiology (Cardiology) Hazle Lites, MD as Consulting Physician (Orthopedic Surgery) Adalberto Hollow, MD as Consulting Physician (General Surgery)  Indicate any recent Medical Services you may have received from other than [facility] providers in the past year (date may be approximate).     Assessment:    This is a routine wellness examination for [name].  Hearing/Vision screen Hearing Screening - Comments:: Pt says her hearing is a little loss but fine Vision Screening -  Comments:: Pt says gets injections in eye   Goals Addressed  This Visit's Progress     Patient Stated (pt-stated)   On track      01/07/24-I will maintain and continue medications as prescribed.        Depression Screen     01/07/2024   10:44 AM 02/05/2023   11:54 AM 01/01/2022    1:10 PM 09/28/2020    2:54 PM 01/16/2018   12:24 PM 12/27/2016   12:13 PM 12/26/2015    1:41 PM  PHQ 2/9 Scores  PHQ - 2 Score 0 0 0 0 1 6 6   PHQ- 9 Score    0 4 22 12     Fall Risk     01/07/2024   10:41 AM 02/05/2023   11:54 AM 01/01/2022    1:14 PM 02/14/2021   12:08 PM 09/28/2020    2:50 PM  Fall Risk   Falls in the past year? 1 0 0 0 0  Number falls in past yr: 0  0 0 0  Injury with Fall? 0 0 0 0 0  Risk for fall due to : No Fall Risks No Fall Risks No Fall Risks Impaired balance/gait Medication side effect;Impaired balance/gait  Follow up Education provided;Falls prevention discussed Falls evaluation completed   Falls evaluation completed;Falls prevention discussed    MEDICARE RISK AT HOME:  Medicare Risk at Home Any stairs in or around the home?: Yes If so, are there any without handrails?: Yes Home free of loose throw rugs in walkways, pet beds, electrical cords, etc?: Yes Adequate lighting in your home to reduce risk of falls?: Yes Life alert?: No Use of a cane, walker or w/c?: Yes Grab bars in the bathroom?: Yes Shower chair or bench in shower?: Yes Elevated toilet seat or a handicapped toilet?: Yes  TIMED UP AND GO:  Was the test performed?  No  Cognitive Function: 6CIT completed    09/28/2020    2:56 PM 01/16/2018   12:24 PM 12/26/2015    1:30 PM  MMSE - Mini Mental State Exam  Orientation to time 5 5 5   Orientation to Place 5 5 5   Registration 3 3 3   Attention/ Calculation 5 0 0  Recall 3 3 3   Language- name 2 objects  0 0  Language- repeat 1 1 1   Language- follow 3 step command  3 3  Language- read & follow direction  0 0  Write a sentence  0 0  Copy design  0  0  Total score  20 20        01/07/2024   10:48 AM 01/01/2022    1:16 PM  6CIT Screen  What Year? 0 points 0 points  What month? 0 points 0 points  What time? 0 points 0 points  Count back from 20 0 points 0 points  Months in reverse 0 points 0 points  Repeat phrase 0 points 0 points  Total Score 0 points 0 points    Immunizations Immunization History  Administered Date(s) Administered   Fluad Quad(high Dose 65+) 08/05/2019   Influenza, High Dose Seasonal PF 07/05/2015   Influenza,inj,Quad PF,6+ Mos 09/30/2013, 07/08/2017, 07/28/2018   PFIZER Comirnaty(Gray Top)Covid-19 Tri-Sucrose Vaccine 02/09/2021   PFIZER(Purple Top)SARS-COV-2 Vaccination 10/12/2019, 11/02/2019   PNEUMOCOCCAL CONJUGATE-20 02/05/2023   Pneumococcal Conjugate-13 09/23/2014   Pneumococcal Polysaccharide-23 04/02/2012   Zoster, Live 09/30/2013    Screening Tests Health Maintenance  Topic Date Due   Zoster Vaccines- Shingrix (1 of 2) 06/01/1994   FOOT EXAM  08/04/2020   Diabetic kidney evaluation - Urine  ACR  02/14/2022   OPHTHALMOLOGY EXAM  10/02/2022   COVID-19 Vaccine (4 - 2024-25 season) 04/28/2023   HEMOGLOBIN A1C  08/07/2023   Diabetic kidney evaluation - eGFR measurement  02/05/2024   MAMMOGRAM  02/05/2024 (Originally 10/22/2015)   INFLUENZA VACCINE  03/27/2024   Medicare Annual Wellness (AWV)  01/06/2025   Pneumonia Vaccine 33+ Years old  Completed   DEXA SCAN  Completed   Hepatitis C Screening  Completed   HPV VACCINES  Aged Out   Meningococcal B Vaccine  Aged Out   DTaP/Tdap/Td  Discontinued   Fecal DNA (Cologuard)  Discontinued    Health Maintenance  Health Maintenance Due  Topic Date Due   Zoster Vaccines- Shingrix (1 of 2) 06/01/1994   FOOT EXAM  08/04/2020   Diabetic kidney evaluation - Urine ACR  02/14/2022   OPHTHALMOLOGY EXAM  10/02/2022   COVID-19 Vaccine (4 - 2024-25 season) 04/28/2023   HEMOGLOBIN A1C  08/07/2023   Diabetic kidney evaluation - eGFR measurement  02/05/2024    Health Maintenance Items Addressed: None at this time needed  Additional Screening:  Vision Screening: Recommended annual ophthalmology exams for early detection of glaucoma and other disorders of the eye.  Dental Screening: Recommended annual dental exams for proper oral hygiene  Community Resource Referral / Chronic Care Management: CRR required this visit?  No   CCM required this visit?  No   Plan:    I have personally reviewed and noted the following in the patient's chart:   Medical and social history Use of alcohol, tobacco or illicit drugs  Current medications and supplements including opioid prescriptions. Patient is not currently taking opioid prescriptions. Functional ability and status Nutritional status Physical activity Advanced directives List of other physicians Hospitalizations, surgeries, and ER visits in previous 12 months Vitals Screenings to include cognitive, depression, and falls Referrals and appointments  In addition, I have reviewed and discussed with patient certain preventive protocols, quality metrics, and best practice recommendations. A written personalized care plan for preventive services as well as general preventive health recommendations were provided to patient.   Nerissa Bannister, LPN   [id number]/[id number]   After Visit Summary: (MyChart) Due to this being a telephonic visit, the after visit summary with patients personalized plan was offered to patient via MyChart   Notes: Nothing significant to report at this time.

## 2024-02-07 ENCOUNTER — Other Ambulatory Visit: Payer: Self-pay | Admitting: Primary Care

## 2024-02-07 ENCOUNTER — Encounter: Payer: Self-pay | Admitting: Primary Care

## 2024-02-07 ENCOUNTER — Ambulatory Visit (INDEPENDENT_AMBULATORY_CARE_PROVIDER_SITE_OTHER): Payer: Medicare HMO | Admitting: Primary Care

## 2024-02-07 VITALS — BP 122/80 | HR 70 | Temp 97.9°F | Ht 63.0 in | Wt 230.0 lb

## 2024-02-07 DIAGNOSIS — E2839 Other primary ovarian failure: Secondary | ICD-10-CM

## 2024-02-07 DIAGNOSIS — N898 Other specified noninflammatory disorders of vagina: Secondary | ICD-10-CM

## 2024-02-07 DIAGNOSIS — M8580 Other specified disorders of bone density and structure, unspecified site: Secondary | ICD-10-CM

## 2024-02-07 DIAGNOSIS — I5032 Chronic diastolic (congestive) heart failure: Secondary | ICD-10-CM | POA: Diagnosis not present

## 2024-02-07 DIAGNOSIS — I10 Essential (primary) hypertension: Secondary | ICD-10-CM

## 2024-02-07 DIAGNOSIS — Z0001 Encounter for general adult medical examination with abnormal findings: Secondary | ICD-10-CM | POA: Diagnosis not present

## 2024-02-07 DIAGNOSIS — R8290 Unspecified abnormal findings in urine: Secondary | ICD-10-CM | POA: Diagnosis not present

## 2024-02-07 DIAGNOSIS — G6289 Other specified polyneuropathies: Secondary | ICD-10-CM | POA: Diagnosis not present

## 2024-02-07 DIAGNOSIS — K219 Gastro-esophageal reflux disease without esophagitis: Secondary | ICD-10-CM

## 2024-02-07 DIAGNOSIS — F32A Depression, unspecified: Secondary | ICD-10-CM

## 2024-02-07 DIAGNOSIS — R21 Rash and other nonspecific skin eruption: Secondary | ICD-10-CM

## 2024-02-07 DIAGNOSIS — Z8639 Personal history of other endocrine, nutritional and metabolic disease: Secondary | ICD-10-CM

## 2024-02-07 LAB — POC URINALSYSI DIPSTICK (AUTOMATED)
Bilirubin, UA: POSITIVE
Blood, UA: NEGATIVE
Glucose, UA: NEGATIVE
Ketones, UA: POSITIVE
Nitrite, UA: NEGATIVE
Protein, UA: POSITIVE — AB
Spec Grav, UA: 1.025 (ref 1.010–1.025)
Urobilinogen, UA: 0.2 U/dL
pH, UA: 5 (ref 5.0–8.0)

## 2024-02-07 MED ORDER — GABAPENTIN 100 MG PO CAPS: 100.0000 mg | ORAL_CAPSULE | Freq: Every day | ORAL | 0 refills | Status: AC

## 2024-02-07 MED ORDER — NYSTATIN-TRIAMCINOLONE 100000-0.1 UNIT/GM-% EX CREA: 1.0000 | TOPICAL_CREAM | Freq: Two times a day (BID) | CUTANEOUS | 0 refills | Status: DC | PRN

## 2024-02-07 NOTE — Assessment & Plan Note (Signed)
 Bone density scan due in Fall 2025, discussed with patient today. Orders placed.

## 2024-02-07 NOTE — Assessment & Plan Note (Addendum)
 Differentials include yeast from moisture, lichen sclerosis which was not obviously visualized today by exam.  Urinalysis collected and pending.  Start nystatin -triamcinolone twice daily x 1 to 2 weeks. Consider clobetasol if no improvement.  A1c pending  She will update

## 2024-02-07 NOTE — Assessment & Plan Note (Signed)
 Controlled,  Continue Nexium 20 mg daily.

## 2024-02-07 NOTE — Assessment & Plan Note (Signed)
 Controlled.  Continue spironolactone  25 mg daily, carvedilol  12.5 mg BID, ramipril  25 mg daily. CMP pending

## 2024-02-07 NOTE — Assessment & Plan Note (Signed)
 Stable.   Following with cardiology, office notes reviewed from November 2024. Echocardiogram reviewed from March 2025.  Continue furosemide  40 mg daily and extra 20 mg if needed, spironolactone  25 mg daily, carvedilol  12.5 mg BID. CMP pending.

## 2024-02-07 NOTE — Patient Instructions (Addendum)
 Stop by the lab prior to leaving today. I will notify you of your results once received.   For restless legs and neuropathy at night you may take gabapentin  100 mg tablets.  Take 1 or 2 pills every night at bedtime.  Apply nystatin -triamcinolone cream to the groin and vaginal area twice daily for 1 to 2 weeks. Let me know if no improvement.   [facility] density [facility] for September.   It was a pleasure to see you today!

## 2024-02-07 NOTE — Assessment & Plan Note (Signed)
 Immunizations UTD. Mammogram declined today. Bone density scan due in Fall 2025, orders placed Colonoscopy N/A given age  Discussed the importance of a healthy diet and regular exercise in order for weight loss, and to reduce the risk of further co-morbidity.  Exam stable. Labs pending.  Follow up in 1 year for repeat physical.

## 2024-02-07 NOTE — Assessment & Plan Note (Signed)
 Uncontrolled.  Discussed options, start gabapentin  100-200 mg HS. She will update.

## 2024-02-07 NOTE — Assessment & Plan Note (Signed)
 Repeat A1C pending.  Continue to work on diet.

## 2024-02-07 NOTE — Progress Notes (Signed)
 Subjective:    Patient ID: [name], female    DOB: [birth year], 78 y.o.   MRN: [id number]  HPI  [name] is a very pleasant 78 y.o. female who presents today for complete physical and follow up of chronic conditions.  She would also like to discuss a few concerns.  1) Vaginal Itching: Chronic for years, itching on the skin of the vagina and groin. She does notice moisture in the groin area which causes her to sweat. She has noticed strong urine smells intermittently. She's applied lotion in the past, also tries to keep it dry. She does notice itching and a rash under her breasts.   She denies vaginal bleeding, vaginal discharge.   2) Restless legs/Neuropathy: Chronic history of neuropathy. Symptoms start with nervous tension while sitting in her chair in the evening, then once she gets in bed she has nervous movement which keeps her awake.  She was once taking a medication for neuropathy years ago. Her legs are causing sleeping disturbances. She has declined sleep study in the past, is not interested.   Immunizations: -Shingles: Completed Zostavax -Pneumonia: Completed Prevnar 13 in 2016, Pneumovax 23 in 2013, Prevnar 20 in 2024  Diet: Fair diet.  Exercise: No regular exercise.  Eye exam: Completes annually  Dental exam: Completed years ago    Mammogram: Completed in 2016, declines given age Bone Density Scan: Completed in September 2023  Colonoscopy: N/A given age   BP Readings from Last 3 Encounters:  02/07/24 122/80  11/04/23 104/68  07/23/23 125/77      Review of Systems  Constitutional:  Negative for unexpected weight change.  HENT:  Negative for rhinorrhea.   Respiratory:  Negative for cough and shortness of breath.   Cardiovascular:  Negative for chest pain.  Gastrointestinal:  Negative for constipation and diarrhea.  Genitourinary:  Negative for difficulty urinating, hematuria, vaginal bleeding and vaginal discharge.       Vaginal itching,  groin itching, foul smelling urine  Musculoskeletal:  Positive for arthralgias.  Skin:  Positive for rash.  Allergic/Immunologic: Negative for environmental allergies.  Neurological:  Positive for numbness. Negative for dizziness and headaches.       Restless legs and neuropathic pain  Psychiatric/Behavioral:  The patient is not nervous/anxious.          Past Medical History:  Diagnosis Date   Acute meniscal tear of left knee 01/19/2013   Atrial septal defect    a. 03/2022 RHC: PA sat 84%; b. 05/2022 Echo: EF 60-65%, suspected ostium secundum ASD w/ predominantly L->R shunting and + bubble study.   Blood transfusion without reported diagnosis    Chest pain    a. 2006 Cath: Nl cors; b. 03/2022 Cath: Nl cors.   Chronic heart failure with preserved ejection fraction (HFpEF) (HCC)    a. 02/2019 Echo: EF 60-65%, no rwma, mildly enlarged RV, nl RV fxn, mildly dil LA; b. 03/2022 RHC:  RA 11, RV 45/5, PA 47/18 (35), PCWP 22, and LVEDP 18.   Depression    Diabetes mellitus    borderline   DJD (degenerative joint disease)    GERD (gastroesophageal reflux disease)    Hyperglycemia    Hypertension    Kidney stones 07/08/2017   Macular degeneration of left eye    Peripheral neuropathy    Restless leg    Syncope and collapse    Vaginal bleeding 02/03/2019    Social History   Socioeconomic History   Marital status: Widowed  Spouse name: Not on file   Number of children: 2   Years of education: Not on file   Highest education level: Bachelor's degree (e.g., BA, AB, BS)  Occupational History   Occupation: retired  Tobacco Use   Smoking status: Former    Current packs/day: 1.00    Average packs/day: 1 pack/day for 20.0 years (20.0 ttl pk-yrs)    Types: Cigarettes   Smokeless tobacco: Never  Vaping Use   Vaping status: Never Used  Substance and Sexual Activity   Alcohol use: Yes    Comment: ocassionally 1 glass of wine per mth   Drug use: No   Sexual activity: Never  Other  Topics Concern   Not on file  Social History Narrative   Patient would desire CPR.   Would not want feeding tubes or heroic measures.   Does not have HPOA.   Social Drivers of Corporate investment banker Strain: Low Risk  (01/07/2024)   Overall Financial Resource Strain (CARDIA)    Difficulty of Paying Living Expenses: Not hard at all  Food Insecurity: No Food Insecurity (01/07/2024)   Hunger Vital Sign    Worried About Running Out of Food in the Last Year: Never true    Ran Out of Food in the Last Year: Never true  Transportation Needs: No Transportation Needs (01/07/2024)   PRAPARE - Administrator, Civil Service (Medical): No    Lack of Transportation (Non-Medical): No  Physical Activity: Inactive (01/07/2024)   Exercise Vital Sign    Days of Exercise per Week: 0 days    Minutes of Exercise per Session: 0 min  Stress: No Stress Concern Present (01/07/2024)   [employer] of Occupational Health - Occupational Stress Questionnaire    Feeling of Stress : Not at all  Recent Concern: Stress - Stress Concern Present (11/02/2023)   [employer] of Occupational Health - Occupational Stress Questionnaire    Feeling of Stress : Rather much  Social Connections: Unknown (01/07/2024)   Social Connection and Isolation Panel    Frequency of Communication with Friends and Family: More than three times a week    Frequency of Social Gatherings with Friends and Family: Once a week    Attends Religious Services: Patient declined    Database administrator or Organizations: No    Attends Banker Meetings: Never    Marital Status: Widowed  Intimate Partner Violence: Not At Risk (01/07/2024)   Humiliation, Afraid, Rape, and Kick questionnaire    Fear of Current or Ex-Partner: No    Emotionally Abused: No    Physically Abused: No    Sexually Abused: No    Past Surgical History:  Procedure Laterality Date   APPENDECTOMY  08/27/1974   CARDIAC CATHETERIZATION   08/27/2004   @ [place]   CATARACT EXTRACTION     02/10/2022 (right eye) and 03/02/2022 (left eye)   JOINT REPLACEMENT  06/27/2005   right total knee   KNEE ARTHROSCOPY WITH MEDIAL MENISECTOMY Left 02/11/2013   Procedure: LEFT KNEE ARTHROSCOPY WITH MEDIAL MENISECTOMY;  Surgeon: Florencia Hunter, MD;  Location: [facility];  Service: Orthopedics;  Laterality: Left;   MULTIPLE TOOTH EXTRACTIONS     OMENTECTOMY  07/21/2012   Procedure: OMENTECTOMY;  Surgeon: Harlee Lichtenstein, MD;  Location: [facility];  Service: General;  Laterality: N/A;   right knee replacement  08/27/2006   RIGHT/LEFT HEART CATH AND CORONARY ANGIOGRAPHY Bilateral 04/04/2022   Procedure: RIGHT/LEFT HEART CATH AND  CORONARY ANGIOGRAPHY;  Surgeon: Wenona Hamilton, MD;  Location: [facility];  Service: Cardiovascular;  Laterality: Bilateral;   TONSILLECTOMY  childhood   TUBAL LIGATION  08/27/1974   UMBILICAL HERNIA REPAIR  07/21/2012   Procedure: HERNIA REPAIR UMBILICAL ADULT;  Surgeon: Harlee Lichtenstein, MD;  Location: [facility];  Service: General;  Laterality: N/A;    Family History  Problem Relation Age of Onset   Parkinsonism Mother    Heart disease Father    Hypertension Father    Heart attack Father    AAA (abdominal aortic aneurysm) Father     No Known Allergies  Current Outpatient Medications on File Prior to Visit  Medication Sig Dispense Refill   aspirin  81 MG tablet Take 81 mg by mouth daily.     Calcium Carb-Cholecalciferol (CALCIUM 600 + D PO) Take by mouth. 2 daily     carvedilol  (COREG ) 12.5 MG tablet TAKE 1 TABLET BY MOUTH 2 TIMES DAILY. **STOP METOPROLOL ** 180 tablet 2   esomeprazole (NEXIUM) 20 MG capsule Take 20 mg by mouth every other day.     furosemide  (LASIX ) 40 MG tablet Take 1 tablet (40 mg total) by mouth daily. May take an extra 20 mg (half tablet) as needed for swelling 180 tablet 3   Multiple Vitamins-Minerals (PRESERVISION AREDS 2 PO) Take by mouth daily in the afternoon.     ramipril   (ALTACE ) 5 MG capsule TAKE 1 CAPSULE (5 MG TOTAL) BY MOUTH DAILY FOR BLOOD PRESSURE. 90 capsule 0   spironolactone  (ALDACTONE ) 25 MG tablet Take 1 tablet (25 mg total) by mouth daily. 90 tablet 2   vitamin B-12 (CYANOCOBALAMIN ) 1000 MCG tablet Take 1,000 mcg by mouth daily.     No current facility-administered medications on file prior to visit.    BP 122/80   Pulse 70   Temp 97.9 F (36.6 C) (Temporal)   Ht 5' 3 (1.6 m)   Wt 230 lb (104.3 kg)   SpO2 96%   BMI 40.74 kg/m  Objective:   Physical Exam HENT:     Right Ear: Tympanic membrane and ear canal normal.     Left Ear: Tympanic membrane and ear canal normal.   Eyes:     Pupils: Pupils are equal, round, and reactive to light.    Cardiovascular:     Rate and Rhythm: Normal rate and regular rhythm.  Pulmonary:     Effort: Pulmonary effort is normal.     Breath sounds: Normal breath sounds.  Abdominal:     General: Bowel sounds are normal.     Palpations: Abdomen is soft.     Tenderness: There is no abdominal tenderness.  Genitourinary:    Labia:        Right: No rash.        Left: No rash.       Comments: Medium red rash to bilateral groin without skin breakdown.   Musculoskeletal:        General: Normal range of motion.     Cervical back: Neck supple.   Skin:    General: Skin is warm and dry.   Neurological:     Mental Status: She is alert and oriented to person, place, and time.     Cranial Nerves: No cranial nerve deficit.     Deep Tendon Reflexes:     Reflex Scores:      Patellar reflexes are 2+ on the right side and 2+ on the left side.  Psychiatric:  Mood and Affect: Mood normal.           Assessment & Plan:  Encounter for annual general medical examination with abnormal findings in adult Assessment & Plan: Immunizations UTD. Mammogram declined today. Bone density scan due in Fall 2025, orders placed Colonoscopy N/A given age  Discussed the importance of a healthy diet and regular  exercise in order for weight loss, and to reduce the risk of further co-morbidity.  Exam stable. Labs pending.  Follow up in 1 year for repeat physical.    Estrogen deficiency -     DG Bone Density; Future  Chronic diastolic heart failure (HCC) Assessment & Plan: Stable.   Following with cardiology, office notes reviewed from November 2024. Echocardiogram reviewed from March 2025.  Continue furosemide  40 mg daily and extra 20 mg if needed, spironolactone  25 mg daily, carvedilol  12.5 mg BID. CMP pending.   Primary hypertension Assessment & Plan: Controlled.  Continue spironolactone  25 mg daily, carvedilol  12.5 mg BID, ramipril  25 mg daily. CMP pending  Orders: -     Lipid panel -     Comprehensive metabolic panel with GFR  Gastroesophageal reflux disease, unspecified whether esophagitis present Assessment & Plan: Controlled,  Continue Nexium 20 mg daily.   Other polyneuropathy Assessment & Plan: Uncontrolled.  Discussed options, start gabapentin  100-200 mg HS. She will update.   Orders: -     Gabapentin ; Take 1-2 capsules (100-200 mg total) by mouth at bedtime. For neuropathy  Dispense: 180 capsule; Refill: 0  Osteopenia, unspecified location Assessment & Plan: Bone density scan due in Fall 2025, discussed with patient today. Orders placed.    Depression, unspecified depression type Assessment & Plan: Stable.  No concerns today.   History of diabetes mellitus, type II Assessment & Plan: Repeat A1C pending.  Continue to work on diet.   Orders: -     Hemoglobin A1c  Foul smelling urine Assessment & Plan: UA today with 1+ leuks, trace ketones, 1+ protein, 1+ bilirubin.    Urine culture ordered and pending.  Await results.  Orders: -     POCT Urinalysis Dipstick (Automated) -     Urine Culture  Rash and nonspecific skin eruption Assessment & Plan: Most representative of yeast given presentation and HPI today.  Start  Nystatin -triamcinolone cream BID x 1-2 weeks. She will update.   Orders: -     Nystatin -Triamcinolone; Apply 1 Application topically 2 (two) times daily as needed. For itching and rash  Dispense: 60 g; Refill: 0  Vaginal itching Assessment & Plan: Differentials include yeast from moisture, lichen sclerosis which was not obviously visualized today by exam.  Urinalysis collected and pending.  Start nystatin -triamcinolone twice daily x 1 to 2 weeks. Consider clobetasol if no improvement.  A1c pending  She will update  Orders: -     Nystatin -Triamcinolone; Apply 1 Application topically 2 (two) times daily as needed. For itching and rash  Dispense: 60 g; Refill: 0        Gabriel John, NP

## 2024-02-07 NOTE — Assessment & Plan Note (Signed)
 Stable. No concerns today

## 2024-02-07 NOTE — Assessment & Plan Note (Addendum)
 UA today with 1+ leuks, trace ketones, 1+ protein, 1+ bilirubin.    Urine culture ordered and pending.  Await results.

## 2024-02-07 NOTE — Addendum Note (Signed)
 Addended by: Gerry Krone on: 02/07/2024 12:45 PM   Modules accepted: Orders

## 2024-02-07 NOTE — Assessment & Plan Note (Signed)
 Most representative of yeast given presentation and HPI today.  Start Nystatin -triamcinolone cream BID x 1-2 weeks. She will update.

## 2024-02-08 LAB — COMPREHENSIVE METABOLIC PANEL WITH GFR
AG Ratio: 2.4 (calc) (ref 1.0–2.5)
ALT: 12 U/L (ref 6–29)
AST: 14 U/L (ref 10–35)
Albumin: 4.5 g/dL (ref 3.6–5.1)
Alkaline phosphatase (APISO): 81 U/L (ref 37–153)
BUN: 18 mg/dL (ref 7–25)
CO2: 26 mmol/L (ref 20–32)
Calcium: 10 mg/dL (ref 8.6–10.4)
Chloride: 104 mmol/L (ref 98–110)
Creat: 0.96 mg/dL (ref 0.60–1.00)
Globulin: 1.9 g/dL (ref 1.9–3.7)
Glucose, Bld: 102 mg/dL — ABNORMAL HIGH (ref 65–99)
Potassium: 4 mmol/L (ref 3.5–5.3)
Sodium: 142 mmol/L (ref 135–146)
Total Bilirubin: 0.7 mg/dL (ref 0.2–1.2)
Total Protein: 6.4 g/dL (ref 6.1–8.1)
eGFR: 60 mL/min/{1.73_m2} (ref >=60)

## 2024-02-08 LAB — LIPID PANEL
Cholesterol: 128 mg/dL (ref <200)
HDL: 53 mg/dL (ref >=50)
LDL Cholesterol (Calc): 51 mg/dL
Non-HDL Cholesterol (Calc): 75 mg/dL (ref <130)
Total CHOL/HDL Ratio: 2.4 (calc) (ref <5.0)
Triglycerides: 160 mg/dL — ABNORMAL HIGH (ref <150)

## 2024-02-08 LAB — HEMOGLOBIN A1C
Hgb A1c MFr Bld: 5.6 % (ref <5.7)
Mean Plasma Glucose: 114 mg/dL
eAG (mmol/L): 6.3 mmol/L

## 2024-02-09 ENCOUNTER — Ambulatory Visit: Payer: Self-pay | Admitting: Primary Care

## 2024-02-09 LAB — URINE CULTURE
MICRO NUMBER:: 16578000
SPECIMEN QUALITY:: ADEQUATE

## 2024-03-11 ENCOUNTER — Other Ambulatory Visit: Payer: Self-pay | Admitting: Primary Care

## 2024-03-11 DIAGNOSIS — I10 Essential (primary) hypertension: Secondary | ICD-10-CM

## 2024-03-12 ENCOUNTER — Other Ambulatory Visit: Payer: Self-pay | Admitting: Cardiovascular Disease

## 2024-04-12 ENCOUNTER — Other Ambulatory Visit: Payer: Self-pay | Admitting: Cardiovascular Disease

## 2024-05-07 ENCOUNTER — Other Ambulatory Visit: Payer: Self-pay | Admitting: Primary Care

## 2024-05-07 DIAGNOSIS — G6289 Other specified polyneuropathies: Secondary | ICD-10-CM

## 2024-05-07 NOTE — Telephone Encounter (Signed)
 Please call patient:  Is she taking the gabapentin  for neuropathy? How many is she taking at night? 1 or 2?

## 2024-05-07 NOTE — Telephone Encounter (Signed)
 Called and spoke with patient she is no longer taking the gabapentin , she did not feel it was effective.

## 2024-06-11 ENCOUNTER — Ambulatory Visit: Payer: Self-pay

## 2024-06-11 ENCOUNTER — Other Ambulatory Visit: Payer: Self-pay | Admitting: Nurse Practitioner

## 2024-06-11 DIAGNOSIS — I5033 Acute on chronic diastolic (congestive) heart failure: Secondary | ICD-10-CM

## 2024-06-11 NOTE — Telephone Encounter (Signed)
 FYI Only or Action Required?: FYI only for provider.  Patient was last seen in primary care on 02/07/2024 by Gretta Comer POUR, NP.  Called Nurse Triage reporting [place].  Symptoms began about a week.  Interventions attempted: Nothing.  Symptoms are: unchanged.  Triage Disposition: See Physician Within 24 Hours  Patient/caregiver understands and will follow disposition?: Yes     Copied from CRM [id number]. Topic: Clinical - Red Word Triage >> Jun 11, 2024  5:01 PM Alfonso ORN wrote: Red Word that prompted transfer to Nurse Triage: pain with lump/swollen  between breast and arm for past 5 days Reason for Disposition  [1] Swelling is painful to touch AND [2] no fever  Answer Assessment - Initial Assessment Questions 1. APPEARANCE of SWELLING: What does it look like?     Swelling or lump  2. SIZE: How large is the swelling? (e.g., inches, cm; or compare to size of pinhead, tip of pen, eraser, coin, pea, grape, ping pong ball)      unknown 3. LOCATION: Where is the swelling located?     Left Lump between under arm and breast 4. ONSET: When did the swelling start?     About a week 5. COLOR: What color is it? Is there more than one color?     no 6. PAIN: Is there any pain? If Yes, ask: How bad is the pain? (Scale 1-10; or mild, moderate, severe)       2/10 7. ITCH: Does it itch? If Yes, ask: How bad is the itch?      no 8. CAUSE: What do you think caused the swelling?     unknown 9 OTHER SYMPTOMS: Do you have any other symptoms? (e.g., fever)     Pain left and shoulder & lump - soreness     Daughter stated the area is right near a nymph node  - daughter is a Engineer, site and stated pt could have been passed something on as far as germs/sickness and wants to figure what this is.  Protocols used: Skin Lump or Localized Swelling-A-AH

## 2024-06-12 ENCOUNTER — Encounter: Payer: Self-pay | Admitting: Primary Care

## 2024-06-12 ENCOUNTER — Ambulatory Visit (INDEPENDENT_AMBULATORY_CARE_PROVIDER_SITE_OTHER): Admitting: Primary Care

## 2024-06-12 VITALS — BP 122/68 | HR 63 | Temp 97.8°F | Ht 63.0 in | Wt 235.0 lb

## 2024-06-12 DIAGNOSIS — R2232 Localized swelling, mass and lump, left upper limb: Secondary | ICD-10-CM | POA: Insufficient documentation

## 2024-06-12 DIAGNOSIS — R0609 Other forms of dyspnea: Secondary | ICD-10-CM | POA: Diagnosis not present

## 2024-06-12 NOTE — Patient Instructions (Addendum)
 Stop by the lab prior to leaving today. I will notify you of your results once received.   You will receive a phone call regarding the mammogram and ultrasounds.  Schedule a follow-up visit with your cardiologist. EKG It was a pleasure to see you today!

## 2024-06-12 NOTE — Telephone Encounter (Signed)
 Noted, will evaluate.

## 2024-06-12 NOTE — Progress Notes (Signed)
 Subjective:    Patient ID: [name], female    DOB: Sep 09, 1945, 78 y.o.   MRN: [id number]  [name] is a very pleasant 78 y.o. female with a history of hypertension, CHF, diabetes, peripheral neuropathy who presents today to discuss axillary masses.  About 2-3 days ago she noticed several lumps under her left axilla with soreness, sometimes radiates to the left lateral breast. Today she's noticed an improvement in soreness. She cannot feel the lumps as much. She has noticed left shoulder pain.   She does have exertional shortness of breath, increased today as she's not taken her furosemide  today. She has a chronic history of fatigue, worse than usual now. She does notice intermittent soren and burning pain to the left lateral breast. She follows with cardiology, last office visit was in November 2024.   She denies fevers, breast lumps, acute illness, breast skin texture changes. Her last mammogram (diagnostic) was in 2016 which was B1 RADS 2. Her last echocardiogram was in March 2025, LVEF of 60-65%, mild to moderate mitral regurgitation.  BP Readings from Last 3 Encounters:  06/12/24 122/68  02/07/24 122/80  11/04/23 104/68   Wt Readings from Last 3 Encounters:  06/12/24 235 lb (106.6 kg)  02/07/24 230 lb (104.3 kg)  01/07/24 234 lb (106.1 kg)      Review of Systems  Constitutional:  Positive for fatigue.  Respiratory:  Positive for shortness of breath.   Cardiovascular:  Negative for chest pain.  Gastrointestinal:  Negative for abdominal pain and nausea.  Skin:  Negative for color change.       Left axillary breast masses         Past Medical History:  Diagnosis Date   Acute meniscal tear of left knee 01/19/2013   Atrial septal defect    a. 03/2022 RHC: PA sat 84%; b. 05/2022 Echo: EF 60-65%, suspected ostium secundum ASD w/ predominantly L->R shunting and + bubble study.   Blood transfusion without reported diagnosis    Chest pain    a. 2006 Cath: Nl  cors; b. 03/2022 Cath: Nl cors.   Chronic heart failure with preserved ejection fraction (HFpEF) (HCC)    a. 02/2019 Echo: EF 60-65%, no rwma, mildly enlarged RV, nl RV fxn, mildly dil LA; b. 03/2022 RHC:  RA 11, RV 45/5, PA 47/18 (35), PCWP 22, and LVEDP 18.   Depression    Diabetes mellitus    borderline   DJD (degenerative joint disease)    GERD (gastroesophageal reflux disease)    Hyperglycemia    Hypertension    Kidney stones 07/08/2017   Macular degeneration of left eye    Peripheral neuropathy    Restless leg    Syncope and collapse    Vaginal bleeding 02/03/2019    Social History   Socioeconomic History   Marital status: Widowed    Spouse name: Not on file   Number of children: 2   Years of education: Not on file   Highest education level: Bachelor's degree (e.g., BA, AB, BS)  Occupational History   Occupation: retired  Tobacco Use   Smoking status: Former    Current packs/day: 1.00    Average packs/day: 1 pack/day for 20.0 years (20.0 ttl pk-yrs)    Types: Cigarettes   Smokeless tobacco: Never  Vaping Use   Vaping status: Never Used  Substance and Sexual Activity   Alcohol use: Yes    Comment: ocassionally 1 glass of wine per mth   Drug  use: No   Sexual activity: Never  Other Topics Concern   Not on file  Social History Narrative   Patient would desire CPR.   Would not want feeding tubes or heroic measures.   Does not have HPOA.   Social Drivers of Corporate investment banker Strain: Low Risk  (01/07/2024)   Overall Financial Resource Strain (CARDIA)    Difficulty of Paying Living Expenses: Not hard at all  Food Insecurity: No Food Insecurity (01/07/2024)   Hunger Vital Sign    Worried About Running Out of Food in the Last Year: Never true    Ran Out of Food in the Last Year: Never true  Transportation Needs: No Transportation Needs (01/07/2024)   PRAPARE - Administrator, Civil Service (Medical): No    Lack of Transportation (Non-Medical): No   Physical Activity: Inactive (01/07/2024)   Exercise Vital Sign    Days of Exercise per Week: 0 days    Minutes of Exercise per Session: 0 min  Stress: No Stress Concern Present (01/07/2024)   [employer] of Occupational Health - Occupational Stress Questionnaire    Feeling of Stress : Not at all  Recent Concern: Stress - Stress Concern Present (11/02/2023)   [employer] of Occupational Health - Occupational Stress Questionnaire    Feeling of Stress : Rather much  Social Connections: Unknown (01/07/2024)   Social Connection and Isolation Panel    Frequency of Communication with Friends and Family: More than three times a week    Frequency of Social Gatherings with Friends and Family: Once a week    Attends Religious Services: Patient declined    Database administrator or Organizations: No    Attends Banker Meetings: Never    Marital Status: Widowed  Intimate Partner Violence: Not At Risk (01/07/2024)   Humiliation, Afraid, Rape, and Kick questionnaire    Fear of Current or Ex-Partner: No    Emotionally Abused: No    Physically Abused: No    Sexually Abused: No    Past Surgical History:  Procedure Laterality Date   APPENDECTOMY  08/27/1974   CARDIAC CATHETERIZATION  08/27/2004   @ [place]   CATARACT EXTRACTION     02/10/2022 (right eye) and 03/02/2022 (left eye)   JOINT REPLACEMENT  06/27/2005   right total knee   KNEE ARTHROSCOPY WITH MEDIAL MENISECTOMY Left 02/11/2013   Procedure: LEFT KNEE ARTHROSCOPY WITH MEDIAL MENISECTOMY;  Surgeon: Tanda DELENA Heading, MD;  Location: [facility];  Service: Orthopedics;  Laterality: Left;   MULTIPLE TOOTH EXTRACTIONS     OMENTECTOMY  07/21/2012   Procedure: OMENTECTOMY;  Surgeon: Krystal JINNY Russell, MD;  Location: [facility];  Service: General;  Laterality: N/A;   right knee replacement  08/27/2006   RIGHT/LEFT HEART CATH AND CORONARY ANGIOGRAPHY Bilateral 04/04/2022   Procedure: RIGHT/LEFT HEART CATH AND CORONARY ANGIOGRAPHY;   Surgeon: Darron Deatrice DELENA, MD;  Location: [facility];  Service: Cardiovascular;  Laterality: Bilateral;   TONSILLECTOMY  childhood   TUBAL LIGATION  08/27/1974   UMBILICAL HERNIA REPAIR  07/21/2012   Procedure: HERNIA REPAIR UMBILICAL ADULT;  Surgeon: Krystal JINNY Russell, MD;  Location: [facility];  Service: General;  Laterality: N/A;    Family History  Problem Relation Age of Onset   Parkinsonism Mother    Heart disease Father    Hypertension Father    Heart attack Father    AAA (abdominal aortic aneurysm) Father     No Known  Allergies  Current Outpatient Medications on File Prior to Visit  Medication Sig Dispense Refill   aspirin  81 MG tablet Take 81 mg by mouth daily.     Calcium Carb-Cholecalciferol (CALCIUM 600 + D PO) Take by mouth. 2 daily     carvedilol  (COREG ) 12.5 MG tablet Take 1 tablet (12.5 mg total) by mouth 2 (two) times daily with a meal. 180 tablet 1   esomeprazole (NEXIUM) 20 MG capsule Take 20 mg by mouth every other day.     furosemide  (LASIX ) 40 MG tablet TAKE 1 TABLET BY MOUTH TWICE A DAY 60 tablet 0   Multiple Vitamins-Minerals (PRESERVISION AREDS 2 PO) Take by mouth daily in the afternoon.     nystatin  cream (MYCOSTATIN ) Apply 1 Application topically 2 (two) times daily. 60 g 0   ramipril  (ALTACE ) 5 MG capsule TAKE 1 CAPSULE (5 MG TOTAL) BY MOUTH DAILY FOR BLOOD PRESSURE. 90 capsule 2   spironolactone  (ALDACTONE ) 25 MG tablet TAKE 1 TABLET (25 MG TOTAL) BY MOUTH DAILY. 90 tablet 0   vitamin B-12 (CYANOCOBALAMIN ) 1000 MCG tablet Take 1,000 mcg by mouth daily.     gabapentin  (NEURONTIN ) 100 MG capsule Take 1-2 capsules (100-200 mg total) by mouth at bedtime. For neuropathy (Patient not taking: Reported on 06/12/2024) 180 capsule 0   No current facility-administered medications on file prior to visit.    BP 122/68   Pulse 63   Temp 97.8 F (36.6 C) (Temporal)   Ht 5' 3 (1.6 m)   Wt 235 lb (106.6 kg)   SpO2 97%   BMI 41.63 kg/m  Objective:    Physical Exam Cardiovascular:     Rate and Rhythm: Normal rate and regular rhythm.  Pulmonary:     Effort: Pulmonary effort is normal.     Breath sounds: Normal breath sounds.  Chest:  Breasts:    Right: No swelling, mass or skin change.     Left: No swelling, mass or skin change.     Comments: 0.5 cm soft mass to left upper axilla. Unable to palpate any other masses. Exam limited as patient cannot get on exam table to lay flat. Musculoskeletal:     Cervical back: Neck supple.  Skin:    General: Skin is warm and dry.  Neurological:     Mental Status: She is alert and oriented to person, place, and time.  Psychiatric:        Mood and Affect: Mood normal.     Physical Exam        Assessment & Plan:  Axillary mass, left Assessment & Plan: Not fully appreciated on exam.  The mass that was noted could be a lymph node but cannot exclude other mass given limitations of exam.  Diagnostic mammogram and bilateral breast ultrasound ordered and pending.  Orders: -     MM 3D DIAGNOSTIC MAMMOGRAM BILATERAL BREAST; Future -     US  BREAST COMPLETE UNI RIGHT INC AXILLA; Future -     US  BREAST COMPLETE UNI LEFT INC AXILLA; Future  Exertional dyspnea Assessment & Plan: Likely secondary to lack of furosemide  for the day. Reviewed echo cardiogram from March 2025.  Will check labs today to rule out other causes. She will take her furosemide  when she returns home.  I also asked for her to schedule a follow-up visit with cardiology.  Today with normal sinus rhythm, rate of 64, no acute ST changes/PAC/PVC.  Appears similar to EKG from November 2024.  Orders: -  EKG 12-Lead -     CBC -     Basic metabolic panel with GFR -     Brain natriuretic peptide    Assessment and Plan Assessment & Plan         Comer MARLA Gaskins, NP  History of Present Illness

## 2024-06-12 NOTE — Assessment & Plan Note (Signed)
 Not fully appreciated on exam.  The mass that was noted could be a lymph node but cannot exclude other mass given limitations of exam.  Diagnostic mammogram and bilateral breast ultrasound ordered and pending.

## 2024-06-12 NOTE — Assessment & Plan Note (Addendum)
 Likely secondary to lack of furosemide  for the day. Reviewed echo cardiogram from March 2025.  Will check labs today to rule out other causes. She will take her furosemide  when she returns home.  I also asked for her to schedule a follow-up visit with cardiology.  Today with normal sinus rhythm, rate of 64, no acute ST changes/PAC/PVC.  Appears similar to EKG from November 2024.

## 2024-06-13 LAB — BASIC METABOLIC PANEL WITH GFR
BUN: 14 mg/dL (ref 7–25)
CO2: 26 mmol/L (ref 20–32)
Calcium: 9.8 mg/dL (ref 8.6–10.4)
Chloride: 105 mmol/L (ref 98–110)
Creat: 0.76 mg/dL (ref 0.60–0.95)
Glucose, Bld: 102 mg/dL — ABNORMAL HIGH (ref 65–99)
Potassium: 4.4 mmol/L (ref 3.5–5.3)
Sodium: 141 mmol/L (ref 135–146)
eGFR: 79 mL/min/1.73m2 (ref >=60)

## 2024-06-13 LAB — CBC
HCT: 42.3 % (ref 35.0–45.0)
Hemoglobin: 13.8 g/dL (ref 11.7–15.5)
MCH: 29.2 pg (ref 27.0–33.0)
MCHC: 32.6 g/dL (ref 32.0–36.0)
MCV: 89.6 fL (ref 80.0–100.0)
MPV: 10.6 fL (ref 7.5–12.5)
Platelets: 263 Thousand/uL (ref 140–400)
RBC: 4.72 Million/uL (ref 3.80–5.10)
RDW: 12.5 % (ref 11.0–15.0)
WBC: 9.7 Thousand/uL (ref 3.8–10.8)

## 2024-06-13 LAB — BRAIN NATRIURETIC PEPTIDE: Brain Natriuretic Peptide: 138 pg/mL — ABNORMAL HIGH (ref <100)

## 2024-06-14 ENCOUNTER — Ambulatory Visit: Payer: Self-pay | Admitting: Primary Care

## 2024-07-10 ENCOUNTER — Other Ambulatory Visit: Payer: Self-pay | Admitting: Cardiovascular Disease

## 2024-07-13 ENCOUNTER — Ambulatory Visit: Payer: Self-pay

## 2024-07-13 NOTE — Telephone Encounter (Signed)
 FYI Only or Action Required?: Action required by provider: pt is requesting cough medication . And antibiotics. PT is interested in prednisone , however need to know if this will interfere with her Macular degeneration shot she is to have on this Thursday. Patient was last seen in primary care on 06/12/2024 by Gretta Comer POUR, NP.  Called Nurse Triage reporting Cough.  Symptoms began a week ago  Interventions attempted: OTC medications:  SABRA  Symptoms are: gradually improving.  Triage Disposition: See HCP Within 4 Hours (Or PCP Triage)  Patient/caregiver understands and will follow disposition?: No, refuses disposition - Pt unable to go to a different office , and cannot have a VV>                   Reason for Disposition  Wheezing is present  Answer Assessment - Initial Assessment Questions 1. ONSET: When did the cough begin?      Tuesday 2. SEVERITY: How bad is the cough today?      moderate 3. SPUTUM: Describe the color of your sputum (e.g., none, dry cough; clear, white, yellow, green)     beige 4. HEMOPTYSIS: Are you coughing up any blood? If Yes, ask: How much? (e.g., flecks, streaks, tablespoons, etc.)     no 5. DIFFICULTY BREATHING: Are you having difficulty breathing? If Yes, ask: How bad is it? (e.g., mild, moderate, severe)      wheezing 6. FEVER: Do you have a fever? If Yes, ask: What is your temperature, how was it measured, and when did it start?     Does not think 7. CARDIAC HISTORY: Do you have any history of heart disease? (e.g., heart attack, congestive heart failure)      CHF 8. LUNG HISTORY: Do you have any history of lung disease?  (e.g., pulmonary embolus, asthma, emphysema)     Always SOB  10. OTHER SYMPTOMS: Do you have any other symptoms? (e.g., runny nose, wheezing, chest pain)       Congestion  Protocols used: Cough - Acute Productive-A-AH

## 2024-07-13 NOTE — Telephone Encounter (Signed)
 First attempt to contact patient, no answer, left voicemail for patient to call back, routing for additional attempts.        Copied from CRM #[id number]. Topic: Clinical - Medical Advice >> Jul 13, 2024 12:03 PM [name] wrote: Reason for CRM: Patient called in stating that she as not feeling good for the past 3 days. She has been c/o congestion, and a cough which worsens at night. Patient states she has tried OTC medications, but wants to know if she can be prescribed something or if she can try something else. Patient was offered appt for next day and she declined. Is requesting a call back from a nurse and can be reached at [phone].

## 2024-07-15 NOTE — Telephone Encounter (Signed)
 Patient will need an office visit for further evaluation of her symptoms.  I do not see a recent office visit for symptoms that would warrant prednisone  and antibiotics.

## 2024-07-16 NOTE — Telephone Encounter (Signed)
 noted

## 2024-07-16 NOTE — Telephone Encounter (Signed)
 Called patient she did not want to make app at this time. Otc meds are helping some. If symptoms don't get better anything new she will reach out to our office for appointment.

## 2024-07-17 ENCOUNTER — Ambulatory Visit: Payer: Self-pay

## 2024-07-17 NOTE — Telephone Encounter (Signed)
 FYI Only or Action Required?: FYI only for provider: appointment scheduled on 07/20/24.  Patient was last seen in primary care on 06/12/2024 by Gretta Comer POUR, NP.  Called Nurse Triage reporting Cough.  Symptoms began a week ago.  Interventions attempted: OTC medications: Dayquil.  Symptoms are: stable.  Triage Disposition: See PCP When Office is Open (Within 3 Days)  Patient/caregiver understands and will follow disposition?: Yes Reason for Disposition  Cough has been present for > 3 weeks  Answer Assessment - Initial Assessment Questions Trying Dayquil cough and congestion, finding mild relief. Patient wanting to schedule appointment on Monday with PCP office.  1. ONSET: When did the cough begin?      Little over a week  2.  SPUTUM: Describe the color of your sputum (e.g., none, dry cough; clear, white, yellow, green)     Beige  3. DIFFICULTY BREATHING: Are you having difficulty breathing? If Yes, ask: How bad is it? (e.g., mild, moderate, severe)      Denies  4. FEVER: Do you have a fever? If Yes, ask: What is your temperature, how was it measured, and when did it start?     Denies  5.. CARDIAC HISTORY: Do you have any history of heart disease? (e.g., heart attack, congestive heart failure)      Denies  6. LUNG HISTORY: Do you have any history of lung disease?  (e.g., pulmonary embolus, asthma, emphysema)     Denies  7. OTHER SYMPTOMS: Do you have any other symptoms? (e.g., runny nose, wheezing, chest pain)       Chest tightness, thinks possible sinus infection  Protocols used: Cough - Acute Productive-A-AH  Copied from CRM #[id number]. Topic: Clinical - Red Word Triage >> Jul 17, 2024  3:43 PM Burnard DEL wrote: Red Word that prompted transfer to Nurse Triage: ongoing cold,chest tightness

## 2024-07-17 NOTE — Telephone Encounter (Signed)
 Noted. Will evaluate in office

## 2024-07-20 ENCOUNTER — Ambulatory Visit (INDEPENDENT_AMBULATORY_CARE_PROVIDER_SITE_OTHER)
Admission: RE | Admit: 2024-07-20 | Discharge: 2024-07-20 | Disposition: A | Discharge status: Home / Self Care | Source: Ambulatory Visit | Attending: Nurse Practitioner | Admitting: Nurse Practitioner

## 2024-07-20 ENCOUNTER — Ambulatory Visit: Admitting: Nurse Practitioner

## 2024-07-20 VITALS — BP 138/70 | HR 69 | Temp 98.2°F | Wt 230.0 lb

## 2024-07-20 DIAGNOSIS — R0602 Shortness of breath: Secondary | ICD-10-CM

## 2024-07-20 DIAGNOSIS — I5032 Chronic diastolic (congestive) heart failure: Secondary | ICD-10-CM | POA: Diagnosis not present

## 2024-07-20 DIAGNOSIS — J0140 Acute pansinusitis, unspecified: Secondary | ICD-10-CM

## 2024-07-20 DIAGNOSIS — R051 Acute cough: Secondary | ICD-10-CM

## 2024-07-20 LAB — CBC
HCT: 43.5 % (ref 36.0–46.0)
Hemoglobin: 14.5 g/dL (ref 12.0–15.0)
MCHC: 33.3 g/dL (ref 30.0–36.0)
MCV: 87.9 fl (ref 78.0–100.0)
Platelets: 275 K/uL (ref 150.0–400.0)
RBC: 4.95 Mil/uL (ref 3.87–5.11)
RDW: 13.4 % (ref 11.5–15.5)
WBC: 8.8 K/uL (ref 4.0–10.5)

## 2024-07-20 LAB — BASIC METABOLIC PANEL WITH GFR
BUN: 16 mg/dL (ref 6–23)
CO2: 28 meq/L (ref 19–32)
Calcium: 10 mg/dL (ref 8.4–10.5)
Chloride: 103 meq/L (ref 96–112)
Creatinine, Ser: 0.78 mg/dL (ref 0.40–1.20)
GFR: 71.88 mL/min (ref >60.00)
Glucose, Bld: 109 mg/dL — ABNORMAL HIGH (ref 70–99)
Potassium: 4.1 meq/L (ref 3.5–5.1)
Sodium: 139 meq/L (ref 135–145)

## 2024-07-20 LAB — BRAIN NATRIURETIC PEPTIDE: Pro B Natriuretic peptide (BNP): 110 pg/mL — ABNORMAL HIGH (ref 0.0–100.0)

## 2024-07-20 MED ORDER — BENZONATATE 100 MG PO CAPS: 100.0000 mg | ORAL_CAPSULE | Freq: Three times a day (TID) | ORAL | 0 refills | Status: AC | PRN

## 2024-07-20 MED ORDER — AMOXICILLIN-POT CLAVULANATE 875-125 MG PO TABS: 1.0000 | ORAL_TABLET | Freq: Two times a day (BID) | ORAL | 0 refills | Status: AC

## 2024-07-20 MED ORDER — PREDNISONE 20 MG PO TABS: 40.0000 mg | ORAL_TABLET | Freq: Every day | ORAL | 0 refills | Status: AC

## 2024-07-20 NOTE — Patient Instructions (Signed)
 Nice to see you today I have sent in 3 medications to the pharmacy. Antibiotic, steroid, and cough medication  I will be in touch with the labs and xray once I have the results Follow up if you do not improve

## 2024-07-20 NOTE — Progress Notes (Signed)
 Established Patient Office Visit  Subjective   Patient ID: [name], female    DOB: 25-Apr-1946  Age: 78 y.o. MRN: [id number]  Chief Complaint  Patient presents with   Cough    Pt complains of cough and congestion with chest pain that started two weeks ago. Dry cough. Pt states of being extremely tired with SOB.     Discussed the use of AI scribe software for clinical note transcription with the patient, who gave verbal consent to proceed.  History of Present Illness [name] [name] is an 78 year old female with heart failure who presents with shortness of breath and persistent cough.  Her symptoms began approximately two weeks ago, characterized by shortness of breath and a persistent cough. Despite using over-the-counter medications like DayQuil DM, her symptoms have not improved significantly, though she notes some relief but remains winded. No fever or chills. She experiences significant fatigue, spending much time in bed. Her throat pain and headaches have resolved, and sinus pressure, initially thought to be related to her eye, has cleared up.  She describes abdominal tightness and some nausea. She has a history of heart failure and is currently taking furosemide  but stopped spironolactone  due to urinary incontinence issues when coughing. She has recently resumed spironolactone  but has experienced recurrent issues.  Her current medications include furosemide , carvedilol , ramipril , and gabapentin , although she stopped gabapentin  due to concerns about memory issues. She has a history of smoking but quit in the 1980s.  She has a history of pneumonia and is not aware of any medication allergies. She has received the original COVID vaccine series but is unsure about her flu shot status this season. She receives injections for macular degeneration and is cautious about medications affecting her eye pressure.      Review of Systems  Constitutional:  Positive for  malaise/fatigue. Negative for chills and fever.  HENT:  Negative for ear discharge, ear pain, sinus pain and sore throat.   Respiratory:  Positive for cough, sputum production and shortness of breath.   Gastrointestinal:  Positive for nausea. Negative for abdominal pain, diarrhea and vomiting.  Neurological:  Negative for headaches.      Objective:     BP 138/70   Pulse 69   Temp 98.2 F (36.8 C) (Oral)   Wt 230 lb (104.3 kg)   SpO2 95%   BMI 40.74 kg/m  BP Readings from Last 3 Encounters:  07/20/24 138/70  06/12/24 122/68  02/07/24 122/80   Wt Readings from Last 3 Encounters:  07/20/24 230 lb (104.3 kg)  06/12/24 235 lb (106.6 kg)  02/07/24 230 lb (104.3 kg)   SpO2 Readings from Last 3 Encounters:  07/20/24 95%  06/12/24 97%  02/07/24 96%      Physical Exam Vitals and nursing note reviewed.  Constitutional:      Appearance: Normal appearance.  HENT:     Right Ear: Tympanic membrane, ear canal and external ear normal.     Left Ear: Tympanic membrane, ear canal and external ear normal.     Nose:     Right Sinus: No maxillary sinus tenderness or frontal sinus tenderness.     Left Sinus: Maxillary sinus tenderness and frontal sinus tenderness present.     Mouth/Throat:     Mouth: Mucous membranes are moist.     Pharynx: Oropharynx is clear.  Cardiovascular:     Rate and Rhythm: Normal rate and regular rhythm.     Heart sounds: Normal heart  sounds.  Pulmonary:     Effort: Pulmonary effort is normal.     Breath sounds: Wheezing present.  Lymphadenopathy:     Cervical: No cervical adenopathy.  Neurological:     Mental Status: She is alert.      No results found for any visits on 07/20/24.    The ASCVD Risk score (Arnett DK, et al., 2019) failed to calculate for the following reasons:   The 2019 ASCVD risk score is only valid for ages 50 to 38    Assessment & Plan:   Problem List Items Addressed This Visit       Cardiovascular and Mediastinum    Chronic diastolic heart failure (HCC)   Relevant Orders   CBC   Basic metabolic panel with GFR   Brain natriuretic peptide   Other Visit Diagnoses       Shortness of breath    -  Primary   Relevant Medications   predniSONE  (DELTASONE ) 20 MG tablet   Other Relevant Orders   DG Chest 2 View (Completed)     Acute cough       Relevant Medications   benzonatate  (TESSALON ) 100 MG capsule   Other Relevant Orders   DG Chest 2 View (Completed)     Acute non-recurrent pansinusitis       Relevant Medications   amoxicillin -clavulanate (AUGMENTIN ) 875-125 MG tablet   predniSONE  (DELTASONE ) 20 MG tablet   benzonatate  (TESSALON ) 100 MG capsule     Assessment and Plan Assessment & Plan Acute respiratory infection with cough, shortness of breath, and possible pneumonia and acute sinusitis Symptoms suggest sinusitis and possible pneumonia. Wheezing noted. Antibiotics and steroids warranted. - Prescribed Augmentin  875 mg twice daily for 7 days. - Prescribed prednisone  for 5 days, to be taken in the morning with food. - Ordered chest x-ray. - Ordered blood work to assess for fluid overload. - Prescribed non-sedating cough medication.   Heart failure Managed with furosemide  and carvedilol . Spironolactone  held due to urinary incontinence. Blood pressure and oxygen levels stable. Monitoring fluid status due to potential overload. - Continue furosemide  as prescribed. - Monitor for signs of fluid overload, including increased shortness of breath or chest pain. - Advised to seek urgent care if experiencing increased shortness of breath, chest pain, or fever.  Macular degeneration Managed with eye injections. Prednisone  may affect intraocular pressure, but benefits outweigh risks. - Continue to monitor intraocular pressure during prednisone  treatment.   Return if symptoms worsen or fail to improve.    Adina Crandall, NP

## 2024-07-24 ENCOUNTER — Ambulatory Visit: Payer: Self-pay | Admitting: Nurse Practitioner

## 2024-08-11 ENCOUNTER — Other Ambulatory Visit: Payer: Self-pay | Admitting: Cardiovascular Disease

## 2024-08-21 ENCOUNTER — Other Ambulatory Visit: Payer: Self-pay | Admitting: Cardiovascular Disease

## 2024-09-05 ENCOUNTER — Other Ambulatory Visit: Payer: Self-pay | Admitting: Cardiovascular Disease

## 2024-09-05 DIAGNOSIS — I5033 Acute on chronic diastolic (congestive) heart failure: Secondary | ICD-10-CM

## 2024-09-19 ENCOUNTER — Other Ambulatory Visit: Payer: Self-pay | Admitting: Cardiovascular Disease

## 2024-09-23 ENCOUNTER — Telehealth: Payer: Self-pay | Admitting: Cardiovascular Disease

## 2024-09-23 NOTE — Telephone Encounter (Signed)
 Dental procedures are low risk and typically do not require any specific cardiac clearance. However, patient has not been seen in our office in over 1 year. Therefore, she will need an in-office visit. She already has an appointment with Dr. Darron scheduled for 10/01/2024 . Pre-op risk assessment can be completed at that time. I will route clearance form to Dr. Darron and add pre-op eval to upcoming appointment notes so that he is aware. Will remove from pre-op pool.   Phylis Javed E Earvin Blazier, PA-C 09/23/2024, 12:42 PM

## 2024-09-23 NOTE — Telephone Encounter (Signed)
"  ° °  Pre-operative Risk Assessment    Patient Name: [name]  DOB: 1945/12/27 MRN: [id number]  Date of last office visit: 07/23/23 Date of next office visit: 10/01/24  Request for Surgical Clearance Procedure:  Dental Extraction - Amount of Teeth to be Pulled:  1  Date of Surgery:  Clearance TBD                              Surgeon:  Dr. Rocky Carwin Surgeon's Group or Practice Name:  [facility] Phone number:  [phone] Fax number:  [phone]  Type of Clearance Requested:   - Medical   Type of Anesthesia:  Local    Additional requests/questions:    Signed, Samule LITTIE Bristol   09/23/2024, 11:40 AM   "

## 2024-10-01 ENCOUNTER — Ambulatory Visit: Admitting: Cardiovascular Disease

## 2024-10-02 ENCOUNTER — Encounter: Payer: Self-pay | Admitting: Primary Care

## 2024-10-14 ENCOUNTER — Ambulatory Visit: Admitting: Cardiovascular Disease

## 2025-01-07 ENCOUNTER — Ambulatory Visit
